# Patient Record
Sex: Female | Born: 1945
Health system: Southern US, Community
[De-identification: ages and names within clinical notes are randomized; demographics above are authoritative.]

## PROBLEM LIST (undated history)

## (undated) DIAGNOSIS — G4700 Insomnia, unspecified: Secondary | ICD-10-CM

## (undated) DIAGNOSIS — K589 Irritable bowel syndrome without diarrhea: Secondary | ICD-10-CM

## (undated) DIAGNOSIS — I341 Nonrheumatic mitral (valve) prolapse: Secondary | ICD-10-CM

## (undated) DIAGNOSIS — F419 Anxiety disorder, unspecified: Secondary | ICD-10-CM

## (undated) DIAGNOSIS — D649 Anemia, unspecified: Secondary | ICD-10-CM

## (undated) DIAGNOSIS — M069 Rheumatoid arthritis, unspecified: Secondary | ICD-10-CM

## (undated) DIAGNOSIS — Z860100 Personal history of colon polyps, unspecified: Secondary | ICD-10-CM

## (undated) DIAGNOSIS — G43109 Migraine with aura, not intractable, without status migrainosus: Secondary | ICD-10-CM

## (undated) DIAGNOSIS — I25119 Atherosclerotic heart disease of native coronary artery with unspecified angina pectoris: Secondary | ICD-10-CM

## (undated) DIAGNOSIS — R0989 Other specified symptoms and signs involving the circulatory and respiratory systems: Secondary | ICD-10-CM

## (undated) DIAGNOSIS — I2510 Atherosclerotic heart disease of native coronary artery without angina pectoris: Secondary | ICD-10-CM

## (undated) DIAGNOSIS — I739 Peripheral vascular disease, unspecified: Secondary | ICD-10-CM

## (undated) DIAGNOSIS — E785 Hyperlipidemia, unspecified: Secondary | ICD-10-CM

## (undated) DIAGNOSIS — E7800 Pure hypercholesterolemia, unspecified: Secondary | ICD-10-CM

## (undated) DIAGNOSIS — K219 Gastro-esophageal reflux disease without esophagitis: Secondary | ICD-10-CM

## (undated) DIAGNOSIS — Z8719 Personal history of other diseases of the digestive system: Secondary | ICD-10-CM

## (undated) DIAGNOSIS — I10 Essential (primary) hypertension: Secondary | ICD-10-CM

## (undated) DIAGNOSIS — F32A Depression, unspecified: Secondary | ICD-10-CM

## (undated) HISTORY — DX: Atherosclerotic heart disease of native coronary artery without angina pectoris: I25.10

## (undated) HISTORY — DX: Rheumatoid arthritis, unspecified: M06.9

## (undated) HISTORY — DX: Irritable bowel syndrome, unspecified: K58.9

## (undated) HISTORY — DX: Peripheral vascular disease, unspecified: I73.9

## (undated) HISTORY — DX: Atherosclerotic heart disease of native coronary artery with unspecified angina pectoris: I25.119

## (undated) HISTORY — DX: Migraine with aura, not intractable, without status migrainosus: G43.109

## (undated) HISTORY — DX: Depression, unspecified: F32.A

## (undated) HISTORY — DX: Anxiety disorder, unspecified: F41.9

## (undated) HISTORY — DX: Personal history of colonic polyps: Z86.010

## (undated) HISTORY — DX: Anemia, unspecified: D64.9

## (undated) HISTORY — DX: Personal history of other diseases of the digestive system: Z87.19

## (undated) HISTORY — DX: Hyperlipidemia, unspecified: E78.5

## (undated) HISTORY — DX: Essential (primary) hypertension: I10

## (undated) HISTORY — DX: Insomnia, unspecified: G47.00

## (undated) HISTORY — DX: Nonrheumatic mitral (valve) prolapse: I34.1

## (undated) HISTORY — PX: ABDOMINAL HYSTERECTOMY: SHX81

## (undated) HISTORY — DX: Pure hypercholesterolemia, unspecified: E78.00

## (undated) HISTORY — PX: CHOLECYSTECTOMY: SHX55

## (undated) HISTORY — DX: Personal history of colon polyps, unspecified: Z86.0100

## (undated) HISTORY — PX: OTHER SURGICAL HISTORY: SHX169

## (undated) HISTORY — DX: Gastro-esophageal reflux disease without esophagitis: K21.9

## (undated) HISTORY — DX: Other specified symptoms and signs involving the circulatory and respiratory systems: R09.89

## (undated) MED FILL — Iron Sucrose Inj 20 MG/ML (Fe Equiv): INTRAVENOUS | Qty: 10 | Status: AC

---

## 2005-04-17 ENCOUNTER — Ambulatory Visit: Payer: Self-pay | Admitting: Family Medicine

## 2005-05-17 ENCOUNTER — Ambulatory Visit: Payer: Self-pay | Admitting: Family Medicine

## 2005-09-14 ENCOUNTER — Ambulatory Visit: Payer: Self-pay | Admitting: Family Medicine

## 2005-10-25 ENCOUNTER — Ambulatory Visit: Payer: Self-pay | Admitting: Family Medicine

## 2006-01-01 ENCOUNTER — Ambulatory Visit: Payer: Self-pay | Admitting: Family Medicine

## 2009-10-18 DIAGNOSIS — I2510 Atherosclerotic heart disease of native coronary artery without angina pectoris: Secondary | ICD-10-CM

## 2009-10-18 HISTORY — DX: Atherosclerotic heart disease of native coronary artery without angina pectoris: I25.10

## 2012-09-25 HISTORY — PX: ESOPHAGOGASTRODUODENOSCOPY: SHX1529

## 2014-10-21 HISTORY — PX: COLONOSCOPY: SHX174

## 2016-01-26 DIAGNOSIS — M7989 Other specified soft tissue disorders: Secondary | ICD-10-CM | POA: Diagnosis not present

## 2016-01-26 DIAGNOSIS — G43809 Other migraine, not intractable, without status migrainosus: Secondary | ICD-10-CM | POA: Diagnosis not present

## 2016-01-27 DIAGNOSIS — I10 Essential (primary) hypertension: Secondary | ICD-10-CM

## 2016-01-27 HISTORY — DX: Essential (primary) hypertension: I10

## 2016-02-11 DIAGNOSIS — Z0000 Encounter for general adult medical examination without abnormal findings: Secondary | ICD-10-CM | POA: Diagnosis not present

## 2016-02-11 DIAGNOSIS — I10 Essential (primary) hypertension: Secondary | ICD-10-CM | POA: Diagnosis not present

## 2016-02-18 DIAGNOSIS — Z0000 Encounter for general adult medical examination without abnormal findings: Secondary | ICD-10-CM | POA: Diagnosis not present

## 2016-02-18 DIAGNOSIS — M069 Rheumatoid arthritis, unspecified: Secondary | ICD-10-CM | POA: Diagnosis not present

## 2016-02-18 DIAGNOSIS — I2510 Atherosclerotic heart disease of native coronary artery without angina pectoris: Secondary | ICD-10-CM | POA: Diagnosis not present

## 2016-02-18 DIAGNOSIS — G4700 Insomnia, unspecified: Secondary | ICD-10-CM | POA: Diagnosis not present

## 2016-02-18 DIAGNOSIS — I739 Peripheral vascular disease, unspecified: Secondary | ICD-10-CM | POA: Diagnosis not present

## 2016-02-18 DIAGNOSIS — G43109 Migraine with aura, not intractable, without status migrainosus: Secondary | ICD-10-CM | POA: Diagnosis not present

## 2016-02-18 DIAGNOSIS — I10 Essential (primary) hypertension: Secondary | ICD-10-CM | POA: Diagnosis not present

## 2016-02-26 DIAGNOSIS — I739 Peripheral vascular disease, unspecified: Secondary | ICD-10-CM

## 2016-02-26 DIAGNOSIS — M069 Rheumatoid arthritis, unspecified: Secondary | ICD-10-CM

## 2016-02-26 DIAGNOSIS — G4700 Insomnia, unspecified: Secondary | ICD-10-CM

## 2016-02-26 DIAGNOSIS — I2510 Atherosclerotic heart disease of native coronary artery without angina pectoris: Secondary | ICD-10-CM

## 2016-02-26 DIAGNOSIS — R0989 Other specified symptoms and signs involving the circulatory and respiratory systems: Secondary | ICD-10-CM | POA: Insufficient documentation

## 2016-02-26 DIAGNOSIS — G43109 Migraine with aura, not intractable, without status migrainosus: Secondary | ICD-10-CM

## 2016-02-26 DIAGNOSIS — I341 Nonrheumatic mitral (valve) prolapse: Secondary | ICD-10-CM | POA: Insufficient documentation

## 2016-02-26 HISTORY — DX: Nonrheumatic mitral (valve) prolapse: I34.1

## 2016-02-26 HISTORY — DX: Atherosclerotic heart disease of native coronary artery without angina pectoris: I25.10

## 2016-02-26 HISTORY — DX: Peripheral vascular disease, unspecified: I73.9

## 2016-02-26 HISTORY — DX: Other specified symptoms and signs involving the circulatory and respiratory systems: R09.89

## 2016-02-26 HISTORY — DX: Rheumatoid arthritis, unspecified: M06.9

## 2016-02-26 HISTORY — DX: Migraine with aura, not intractable, without status migrainosus: G43.109

## 2016-02-26 HISTORY — DX: Insomnia, unspecified: G47.00

## 2016-03-12 DIAGNOSIS — I25119 Atherosclerotic heart disease of native coronary artery with unspecified angina pectoris: Secondary | ICD-10-CM

## 2016-03-12 DIAGNOSIS — I10 Essential (primary) hypertension: Secondary | ICD-10-CM

## 2016-03-12 DIAGNOSIS — E785 Hyperlipidemia, unspecified: Secondary | ICD-10-CM

## 2016-03-12 HISTORY — DX: Atherosclerotic heart disease of native coronary artery with unspecified angina pectoris: I25.119

## 2016-03-12 HISTORY — DX: Hyperlipidemia, unspecified: E78.5

## 2016-03-12 HISTORY — DX: Essential (primary) hypertension: I10

## 2016-03-16 DIAGNOSIS — E785 Hyperlipidemia, unspecified: Secondary | ICD-10-CM | POA: Diagnosis not present

## 2016-03-16 DIAGNOSIS — I25119 Atherosclerotic heart disease of native coronary artery with unspecified angina pectoris: Secondary | ICD-10-CM | POA: Diagnosis not present

## 2016-03-24 DIAGNOSIS — Z1231 Encounter for screening mammogram for malignant neoplasm of breast: Secondary | ICD-10-CM | POA: Diagnosis not present

## 2016-06-13 DIAGNOSIS — R1030 Lower abdominal pain, unspecified: Secondary | ICD-10-CM | POA: Diagnosis not present

## 2016-06-13 DIAGNOSIS — J329 Chronic sinusitis, unspecified: Secondary | ICD-10-CM | POA: Diagnosis not present

## 2016-06-13 DIAGNOSIS — G4700 Insomnia, unspecified: Secondary | ICD-10-CM | POA: Diagnosis not present

## 2016-06-13 DIAGNOSIS — N952 Postmenopausal atrophic vaginitis: Secondary | ICD-10-CM | POA: Diagnosis not present

## 2016-06-13 DIAGNOSIS — I10 Essential (primary) hypertension: Secondary | ICD-10-CM | POA: Diagnosis not present

## 2016-07-10 DIAGNOSIS — I10 Essential (primary) hypertension: Secondary | ICD-10-CM | POA: Diagnosis not present

## 2016-07-10 DIAGNOSIS — G4700 Insomnia, unspecified: Secondary | ICD-10-CM | POA: Diagnosis not present

## 2016-07-10 DIAGNOSIS — R1030 Lower abdominal pain, unspecified: Secondary | ICD-10-CM | POA: Diagnosis not present

## 2016-08-23 DIAGNOSIS — H04123 Dry eye syndrome of bilateral lacrimal glands: Secondary | ICD-10-CM | POA: Diagnosis not present

## 2016-08-23 DIAGNOSIS — H2513 Age-related nuclear cataract, bilateral: Secondary | ICD-10-CM | POA: Diagnosis not present

## 2016-09-11 DIAGNOSIS — R1032 Left lower quadrant pain: Secondary | ICD-10-CM | POA: Diagnosis not present

## 2016-09-11 DIAGNOSIS — Z8719 Personal history of other diseases of the digestive system: Secondary | ICD-10-CM

## 2016-09-11 DIAGNOSIS — R110 Nausea: Secondary | ICD-10-CM | POA: Diagnosis not present

## 2016-09-11 HISTORY — DX: Personal history of other diseases of the digestive system: Z87.19

## 2016-09-13 DIAGNOSIS — Z8719 Personal history of other diseases of the digestive system: Secondary | ICD-10-CM | POA: Diagnosis not present

## 2016-09-13 DIAGNOSIS — R39198 Other difficulties with micturition: Secondary | ICD-10-CM | POA: Diagnosis not present

## 2016-09-13 DIAGNOSIS — Z9049 Acquired absence of other specified parts of digestive tract: Secondary | ICD-10-CM | POA: Diagnosis not present

## 2016-09-13 DIAGNOSIS — K5730 Diverticulosis of large intestine without perforation or abscess without bleeding: Secondary | ICD-10-CM | POA: Diagnosis not present

## 2016-09-13 DIAGNOSIS — R1032 Left lower quadrant pain: Secondary | ICD-10-CM | POA: Diagnosis not present

## 2016-09-15 DIAGNOSIS — R109 Unspecified abdominal pain: Secondary | ICD-10-CM | POA: Diagnosis not present

## 2016-09-15 DIAGNOSIS — Z8719 Personal history of other diseases of the digestive system: Secondary | ICD-10-CM | POA: Diagnosis not present

## 2016-09-15 DIAGNOSIS — R1032 Left lower quadrant pain: Secondary | ICD-10-CM | POA: Diagnosis not present

## 2016-09-15 DIAGNOSIS — R39198 Other difficulties with micturition: Secondary | ICD-10-CM | POA: Diagnosis not present

## 2016-10-18 DIAGNOSIS — Z23 Encounter for immunization: Secondary | ICD-10-CM | POA: Diagnosis not present

## 2017-01-15 DIAGNOSIS — Z79899 Other long term (current) drug therapy: Secondary | ICD-10-CM | POA: Diagnosis not present

## 2017-01-15 DIAGNOSIS — Z13220 Encounter for screening for lipoid disorders: Secondary | ICD-10-CM | POA: Diagnosis not present

## 2017-01-15 DIAGNOSIS — R7301 Impaired fasting glucose: Secondary | ICD-10-CM | POA: Diagnosis not present

## 2017-01-15 DIAGNOSIS — Z1329 Encounter for screening for other suspected endocrine disorder: Secondary | ICD-10-CM | POA: Diagnosis not present

## 2017-01-17 DIAGNOSIS — I341 Nonrheumatic mitral (valve) prolapse: Secondary | ICD-10-CM | POA: Diagnosis not present

## 2017-01-17 DIAGNOSIS — G43109 Migraine with aura, not intractable, without status migrainosus: Secondary | ICD-10-CM | POA: Diagnosis not present

## 2017-01-17 DIAGNOSIS — Z8719 Personal history of other diseases of the digestive system: Secondary | ICD-10-CM | POA: Diagnosis not present

## 2017-01-17 DIAGNOSIS — I739 Peripheral vascular disease, unspecified: Secondary | ICD-10-CM | POA: Diagnosis not present

## 2017-01-17 DIAGNOSIS — Z23 Encounter for immunization: Secondary | ICD-10-CM | POA: Diagnosis not present

## 2017-01-17 DIAGNOSIS — E7800 Pure hypercholesterolemia, unspecified: Secondary | ICD-10-CM

## 2017-01-17 DIAGNOSIS — I10 Essential (primary) hypertension: Secondary | ICD-10-CM | POA: Diagnosis not present

## 2017-01-17 DIAGNOSIS — I2510 Atherosclerotic heart disease of native coronary artery without angina pectoris: Secondary | ICD-10-CM | POA: Diagnosis not present

## 2017-01-17 DIAGNOSIS — G4700 Insomnia, unspecified: Secondary | ICD-10-CM | POA: Diagnosis not present

## 2017-01-17 DIAGNOSIS — M069 Rheumatoid arthritis, unspecified: Secondary | ICD-10-CM | POA: Diagnosis not present

## 2017-01-17 HISTORY — DX: Pure hypercholesterolemia, unspecified: E78.00

## 2017-03-21 DIAGNOSIS — I25119 Atherosclerotic heart disease of native coronary artery with unspecified angina pectoris: Secondary | ICD-10-CM | POA: Diagnosis not present

## 2017-03-21 DIAGNOSIS — I10 Essential (primary) hypertension: Secondary | ICD-10-CM | POA: Diagnosis not present

## 2017-03-21 DIAGNOSIS — E785 Hyperlipidemia, unspecified: Secondary | ICD-10-CM | POA: Diagnosis not present

## 2017-05-11 DIAGNOSIS — Z1231 Encounter for screening mammogram for malignant neoplasm of breast: Secondary | ICD-10-CM | POA: Diagnosis not present

## 2017-07-31 DIAGNOSIS — G43109 Migraine with aura, not intractable, without status migrainosus: Secondary | ICD-10-CM | POA: Diagnosis not present

## 2017-07-31 DIAGNOSIS — D649 Anemia, unspecified: Secondary | ICD-10-CM

## 2017-07-31 DIAGNOSIS — G4700 Insomnia, unspecified: Secondary | ICD-10-CM | POA: Diagnosis not present

## 2017-07-31 HISTORY — DX: Anemia, unspecified: D64.9

## 2017-10-09 DIAGNOSIS — Z23 Encounter for immunization: Secondary | ICD-10-CM | POA: Diagnosis not present

## 2017-10-09 DIAGNOSIS — H2513 Age-related nuclear cataract, bilateral: Secondary | ICD-10-CM | POA: Diagnosis not present

## 2017-10-15 ENCOUNTER — Encounter: Payer: Self-pay | Admitting: *Deleted

## 2017-10-16 ENCOUNTER — Other Ambulatory Visit: Payer: Self-pay | Admitting: *Deleted

## 2017-10-16 ENCOUNTER — Encounter: Payer: Self-pay | Admitting: *Deleted

## 2017-10-19 NOTE — Progress Notes (Signed)
Cardiology Office Note:    Date:  10/22/2017   ID:  Angela Mccoy, DOB Jul 27, 1946, MRN [id number]  PCP:  [name], DO  Cardiologist:  [name], MD    Referring MD: She will be establishing care with Dr. Helene Kelp   ASSESSMENT:    1. Coronary artery disease involving native coronary artery of native heart with angina pectoris ([place])   2. Essential hypertension   3. High cholesterol   4. Hyperlipidemia, unspecified hyperlipidemia type    PLAN:    In order of problems listed above:  1. Stable continue medical treatment including clopidogrel and statin.  At this time she wishes to defer an ischemic evaluation.  In the absence of symptoms is a reasonable choice. 2. Stable blood pressure target continue ARB diuretic 3. Stable continue her low intensity statin   Next appointment: 6 months   Medication Adjustments/Labs and Tests Ordered: Current medicines are reviewed at length with the patient today.  Concerns regarding medicines are outlined above.  Orders Placed This Encounter  Procedures  . Comprehensive Metabolic Panel (CMET)  . Lipid Panel w/o Chol/HDL Ratio  . EKG 12-Lead   No orders of the defined types were placed in this encounter.   Chief Complaint  Patient presents with  . Follow-up  . Coronary Artery Disease  . Hypertension  . Hyperlipidemia    History of Present Illness:    Angela Mccoy is a 71 y.o. female with a hx of CAD withPCI and DES to LAD and RCA in 2003 , Dyslipidemia, HTN, Swelling/Edema  last seen 6 months ago.. Compliance with diet, lifestyle and medications: yes Past Medical History:  Diagnosis Date  . Anemia 07/31/2017  . Carotid bruit 02/26/2016  . Carotid bruit present 02/26/2016  . Coronary artery disease involving native coronary artery 02/26/2016  . Coronary artery disease involving native coronary artery of native heart with angina pectoris ([place]) 03/12/2016   Overview:  PCI and DES to LAD and RCA in 2003, cath 2010 wo restenosis  .  Essential hypertension 03/12/2016  . High cholesterol 01/17/2017  . Hx of diverticulitis of colon 09/11/2016  . Hyperlipidemia 03/12/2016  . Hypertension 01/27/2016  . Insomnia 02/26/2016  . Migraine with aura 02/26/2016  . Mitral valve prolapse 02/26/2016  . Peripheral vascular disease ([place]) 02/26/2016  . Rheumatoid arthritis involving multiple joints ([place]) 02/26/2016    Past Surgical History:  Procedure Laterality Date  . ABDOMINAL HYSTERECTOMY    . CHOLECYSTECTOMY      Current Medications: Current Meds  Medication Sig  . citalopram (CELEXA) 40 MG tablet TAKE 1 TABLET BY MOUTH EVERY DAY  . clopidogrel (PLAVIX) 75 MG tablet TAKE 1 TABLET BY MOUTH EVERY DAY  . losartan-hydrochlorothiazide (HYZAAR) 100-25 MG tablet TAKE 1 TABLET BY MOUTH EVERY DAY FOR BLOOD PRESSURE  . nitroGLYCERIN (NITROSTAT) 0.4 MG SL tablet ONE TABLET UNDER TONGUE AS NEEDED FOR CHEST PAIN EVERY 5 MINUTES  . pravastatin (PRAVACHOL) 40 MG tablet TAKE 1 TABLET BY MOUTH DAILY  . propranolol (INDERAL) 10 MG tablet TAKE 1 TABLET BY MOUTH ONCE A DAY  . QUEtiapine (SEROQUEL) 200 MG tablet Take 200 mg by mouth.  . traMADol (ULTRAM) 50 MG tablet TAKE 1 TABLET BY MOUTH EVERY 6 HOURS AS NEEDED  . triamcinolone cream (KENALOG) 0.1 % APPLY EXTERNALLY TO THE AFFECTED AREA TWICE DAILY AS NEEDED     Allergies:   Penicillins   Social History   Social History  . Marital status: Married    Spouse name:  N/A  . Number of children: N/A  . Years of education: N/A   Social History Main Topics  . Smoking status: Former Research scientist (life sciences)  . Smokeless tobacco: Never Used  . Alcohol use No  . Drug use: Unknown  . Sexual activity: Not Asked   Other Topics Concern  . None   Social History Narrative  . None     Family History: The patient's family history includes Heart attack in her brother, mother, and sister. ROS:   Please see the history of present illness.    All other systems reviewed and are negative.  EKGs/Labs/Other Studies Reviewed:     The following studies were reviewed today:  EKG:  EKG ordered today.  The ekg ordered today demonstrates [place] first degree AVB  Recent Labs: CMP normal 01/15/17 No results found for requested labs within last 8760 hours.  Recent Lipid Panel 01/15/17 Chol 126, HDL 43, LDL 83 No results found for: CHOL, TRIG, HDL, CHOLHDL, VLDL, LDLCALC, LDLDIRECT  Physical Exam:    VS:  BP 130/70 (BP Location: Right Arm, Patient Position: Sitting, Cuff Size: Normal)   Pulse (!) 57   Ht 5' 2.5" (1.588 m)   Wt 146 lb (66.2 kg)   SpO2 97%   BMI 26.28 kg/m     Wt Readings from Last 3 Encounters:  10/22/17 146 lb (66.2 kg)     GEN:  Well nourished, well developed in no acute distress HEENT: Normal NECK: No JVD; No carotid bruits LYMPHATICS: No lymphadenopathy CARDIAC: RRR, no murmurs, rubs, gallops RESPIRATORY:  Clear to auscultation without rales, wheezing or rhonchi  ABDOMEN: Soft, non-tender, non-distended MUSCULOSKELETAL:  No edema; No deformity  SKIN: Warm and dry NEUROLOGIC:  Alert and oriented x 3 PSYCHIATRIC:  Normal affect    Signed, [name], MD  10/22/2017 8:31 AM    [place]

## 2017-10-22 ENCOUNTER — Encounter: Payer: Self-pay | Admitting: Cardiology

## 2017-10-22 ENCOUNTER — Ambulatory Visit (INDEPENDENT_AMBULATORY_CARE_PROVIDER_SITE_OTHER): Payer: Medicare Other | Admitting: Cardiology

## 2017-10-22 VITALS — BP 130/70 | HR 57 | Ht 62.5 in | Wt 146.0 lb

## 2017-10-22 DIAGNOSIS — I10 Essential (primary) hypertension: Secondary | ICD-10-CM

## 2017-10-22 DIAGNOSIS — E7800 Pure hypercholesterolemia, unspecified: Secondary | ICD-10-CM

## 2017-10-22 DIAGNOSIS — I25119 Atherosclerotic heart disease of native coronary artery with unspecified angina pectoris: Secondary | ICD-10-CM

## 2017-10-22 NOTE — Patient Instructions (Addendum)
Medication Instructions:  Your physician recommends that you continue on your current medications as directed. Please refer to the Current Medication list given to you today.  Labwork: Your physician recommends that you return for lab work in: today. CMP, lipid  Testing/Procedures: You had an EKG today.  Follow-Up: Your physician wants you to follow-up in: 9 months. You will receive a reminder letter in the mail two months in advance. If you don't receive a letter, please call our office to schedule the follow-up appointment.  Any Other Special Instructions Will Be Listed Below (If Applicable).     If you need a refill on your cardiac medications before your next appointment, please call your pharmacy.

## 2017-10-23 LAB — LIPID PANEL W/O CHOL/HDL RATIO
CHOLESTEROL TOTAL: 135 mg/dL (ref 100–199)
HDL: 49 mg/dL (ref >39)
LDL CALC: 55 mg/dL (ref 0–99)
Triglycerides: 155 mg/dL — ABNORMAL HIGH (ref 0–149)
VLDL CHOLESTEROL CAL: 31 mg/dL (ref 5–40)

## 2017-10-23 LAB — COMPREHENSIVE METABOLIC PANEL
ALBUMIN: 4.4 g/dL (ref 3.5–4.8)
ALK PHOS: 87 IU/L (ref 39–117)
ALT: 21 IU/L (ref 0–32)
AST: 24 IU/L (ref 0–40)
Albumin/Globulin Ratio: 1.4 (ref 1.2–2.2)
BUN / CREAT RATIO: 16 (ref 12–28)
BUN: 16 mg/dL (ref 8–27)
Bilirubin Total: 0.2 mg/dL (ref 0.0–1.2)
CALCIUM: 10.3 mg/dL (ref 8.7–10.3)
CO2: 29 mmol/L (ref 20–29)
CREATININE: 1.02 mg/dL — AB (ref 0.57–1.00)
Chloride: 93 mmol/L — ABNORMAL LOW (ref 96–106)
GFR calc Af Amer: 64 mL/min/{1.73_m2} (ref >59)
GFR, EST NON AFRICAN AMERICAN: 56 mL/min/{1.73_m2} — AB (ref >59)
GLUCOSE: 92 mg/dL (ref 65–99)
Globulin, Total: 3.1 g/dL (ref 1.5–4.5)
Potassium: 4.8 mmol/L (ref 3.5–5.2)
Sodium: 133 mmol/L — ABNORMAL LOW (ref 134–144)
TOTAL PROTEIN: 7.5 g/dL (ref 6.0–8.5)

## 2017-11-12 DIAGNOSIS — S61211A Laceration without foreign body of left index finger without damage to nail, initial encounter: Secondary | ICD-10-CM | POA: Diagnosis not present

## 2017-11-12 DIAGNOSIS — S63261A Dislocation of metacarpophalangeal joint of left index finger, initial encounter: Secondary | ICD-10-CM | POA: Diagnosis not present

## 2017-11-12 DIAGNOSIS — S63251A Unspecified dislocation of left index finger, initial encounter: Secondary | ICD-10-CM | POA: Diagnosis not present

## 2017-11-12 DIAGNOSIS — Z23 Encounter for immunization: Secondary | ICD-10-CM | POA: Diagnosis not present

## 2017-11-14 DIAGNOSIS — S63251A Unspecified dislocation of left index finger, initial encounter: Secondary | ICD-10-CM | POA: Diagnosis not present

## 2017-11-28 DIAGNOSIS — S63251A Unspecified dislocation of left index finger, initial encounter: Secondary | ICD-10-CM | POA: Diagnosis not present

## 2017-12-13 DIAGNOSIS — G43909 Migraine, unspecified, not intractable, without status migrainosus: Secondary | ICD-10-CM | POA: Diagnosis not present

## 2017-12-13 DIAGNOSIS — I2510 Atherosclerotic heart disease of native coronary artery without angina pectoris: Secondary | ICD-10-CM | POA: Diagnosis not present

## 2017-12-13 DIAGNOSIS — Z6826 Body mass index (BMI) 26.0-26.9, adult: Secondary | ICD-10-CM | POA: Diagnosis not present

## 2018-01-15 DIAGNOSIS — F419 Anxiety disorder, unspecified: Secondary | ICD-10-CM | POA: Diagnosis not present

## 2018-01-15 DIAGNOSIS — M8580 Other specified disorders of bone density and structure, unspecified site: Secondary | ICD-10-CM | POA: Diagnosis not present

## 2018-01-15 DIAGNOSIS — M859 Disorder of bone density and structure, unspecified: Secondary | ICD-10-CM | POA: Diagnosis not present

## 2018-01-15 DIAGNOSIS — E785 Hyperlipidemia, unspecified: Secondary | ICD-10-CM | POA: Diagnosis not present

## 2018-01-15 DIAGNOSIS — Z0000 Encounter for general adult medical examination without abnormal findings: Secondary | ICD-10-CM | POA: Diagnosis not present

## 2018-01-15 DIAGNOSIS — Z13820 Encounter for screening for osteoporosis: Secondary | ICD-10-CM | POA: Diagnosis not present

## 2018-01-15 DIAGNOSIS — F329 Major depressive disorder, single episode, unspecified: Secondary | ICD-10-CM | POA: Diagnosis not present

## 2018-02-20 DIAGNOSIS — J208 Acute bronchitis due to other specified organisms: Secondary | ICD-10-CM | POA: Diagnosis not present

## 2018-02-20 DIAGNOSIS — B9689 Other specified bacterial agents as the cause of diseases classified elsewhere: Secondary | ICD-10-CM | POA: Diagnosis not present

## 2018-02-20 DIAGNOSIS — J90 Pleural effusion, not elsewhere classified: Secondary | ICD-10-CM | POA: Diagnosis not present

## 2018-02-20 DIAGNOSIS — Z6825 Body mass index (BMI) 25.0-25.9, adult: Secondary | ICD-10-CM | POA: Diagnosis not present

## 2018-03-07 DIAGNOSIS — Z6825 Body mass index (BMI) 25.0-25.9, adult: Secondary | ICD-10-CM | POA: Diagnosis not present

## 2018-03-07 DIAGNOSIS — B0229 Other postherpetic nervous system involvement: Secondary | ICD-10-CM | POA: Diagnosis not present

## 2018-06-21 DIAGNOSIS — Z6825 Body mass index (BMI) 25.0-25.9, adult: Secondary | ICD-10-CM | POA: Diagnosis not present

## 2018-06-21 DIAGNOSIS — F4321 Adjustment disorder with depressed mood: Secondary | ICD-10-CM | POA: Diagnosis not present

## 2018-07-27 DIAGNOSIS — Z1231 Encounter for screening mammogram for malignant neoplasm of breast: Secondary | ICD-10-CM | POA: Diagnosis not present

## 2018-08-27 ENCOUNTER — Ambulatory Visit: Payer: Medicare Other | Admitting: Cardiology

## 2018-09-02 DIAGNOSIS — R509 Fever, unspecified: Secondary | ICD-10-CM | POA: Diagnosis not present

## 2018-09-02 DIAGNOSIS — R1084 Generalized abdominal pain: Secondary | ICD-10-CM | POA: Diagnosis not present

## 2018-09-02 DIAGNOSIS — R112 Nausea with vomiting, unspecified: Secondary | ICD-10-CM | POA: Diagnosis not present

## 2018-09-20 ENCOUNTER — Encounter: Payer: Self-pay | Admitting: Cardiology

## 2018-09-20 ENCOUNTER — Ambulatory Visit (INDEPENDENT_AMBULATORY_CARE_PROVIDER_SITE_OTHER): Payer: Medicare Other | Admitting: Cardiology

## 2018-09-20 VITALS — BP 156/80 | HR 64 | Ht 62.5 in | Wt 143.2 lb

## 2018-09-20 DIAGNOSIS — E785 Hyperlipidemia, unspecified: Secondary | ICD-10-CM

## 2018-09-20 DIAGNOSIS — I25119 Atherosclerotic heart disease of native coronary artery with unspecified angina pectoris: Secondary | ICD-10-CM

## 2018-09-20 DIAGNOSIS — I10 Essential (primary) hypertension: Secondary | ICD-10-CM | POA: Diagnosis not present

## 2018-09-20 MED ORDER — CANDESARTAN CILEXETIL-HCTZ 32-12.5 MG PO TABS: 1.0000 | ORAL_TABLET | Freq: Every day | ORAL | 6 refills | Status: DC

## 2018-09-20 NOTE — Progress Notes (Signed)
Cardiology Office Note:    Date:  09/20/2018   ID:  Angela Mccoy, DOB 05/20/1946, MRN [id number]  PCP:  Ronita Hipps, MD  Cardiologist:  [name], MD    Referring MD: Ronita Hipps, MD    ASSESSMENT:    1. Coronary artery disease involving native coronary artery of native heart with angina pectoris ([place])   2. Essential hypertension   3. Hyperlipidemia, unspecified hyperlipidemia type    PLAN:    In order of problems listed above:  1. Her CAD is chronic stable presently having no angina we reviewed the option of doing ischemia evaluation she feels comfortable with monitoring symptoms and medical treatment at this time. 2. Not well controlled repeat 150/70 I will switch her to a more potent ARB diuretic combination recheck office BP in 1 week goal less than 938 systolic 3. Her lipids are ideal continue her current lipid-lowering therapy   Next appointment: One year   Medication Adjustments/Labs and Tests Ordered: Current medicines are reviewed at length with the patient today.  Concerns regarding medicines are outlined above.  Orders Placed This Encounter  Procedures  . EKG 12-Lead   Meds ordered this encounter  Medications  . candesartan-hydrochlorothiazide (ATACAND HCT) 32-12.5 MG tablet    Sig: Take 1 tablet by mouth daily.    Dispense:  30 tablet    Refill:  6    Chief Complaint  Patient presents with  . Coronary Artery Disease  . Hypertension  . Hyperlipidemia    History of Present Illness:    Angela Mccoy is a 72 y.o. female with a hx of CAD with PCI and drug-eluting stent to the LAD and right coronary artery in 2003, dyslipidemia hypertension and chronic lower extremity edema last seen 10/22/2017. Compliance with diet, lifestyle and medications: Yes  She is at a very difficult year her daughter died during the winter of brain tumor and a sister also died of intracranial hemorrhage.  During grieving she had a great deal of chest pain and took  nitroglycerin frequently.  Was seen with her PCP placed and anxiety lytic medication and the episodes stopped.  She has had none since she goes to the gym and exercises nearly daily sodium restriction was unaware blood pressure was above target.  No shortness of breath edema syncope or TIA Past Medical History:  Diagnosis Date  . Anemia 07/31/2017  . Carotid bruit 02/26/2016  . Carotid bruit present 02/26/2016  . Coronary artery disease involving native coronary artery 02/26/2016  . Coronary artery disease involving native coronary artery of native heart with angina pectoris ([place]) 03/12/2016   Overview:  PCI and DES to LAD and RCA in 2003, cath 2010 wo restenosis  . Essential hypertension 03/12/2016  . High cholesterol 01/17/2017  . Hx of diverticulitis of colon 09/11/2016  . Hyperlipidemia 03/12/2016  . Hypertension 01/27/2016  . Insomnia 02/26/2016  . Migraine with aura 02/26/2016  . Mitral valve prolapse 02/26/2016  . Peripheral vascular disease ([place]) 02/26/2016  . Rheumatoid arthritis involving multiple joints ([place]) 02/26/2016    Past Surgical History:  Procedure Laterality Date  . ABDOMINAL HYSTERECTOMY    . CHOLECYSTECTOMY      Current Medications: Current Meds  Medication Sig  . citalopram (CELEXA) 40 MG tablet TAKE 1 TABLET BY MOUTH EVERY DAY  . clopidogrel (PLAVIX) 75 MG tablet TAKE 1 TABLET BY MOUTH EVERY DAY  . LORazepam (ATIVAN) 0.5 MG tablet Take 0.5 mg by mouth at bedtime.  . nitroGLYCERIN (  NITROSTAT) 0.4 MG SL tablet ONE TABLET UNDER TONGUE AS NEEDED FOR CHEST PAIN EVERY 5 MINUTES  . pravastatin (PRAVACHOL) 40 MG tablet TAKE 1 TABLET BY MOUTH DAILY  . propranolol (INDERAL) 10 MG tablet TAKE 1 TABLET BY MOUTH ONCE A DAY  . QUEtiapine (SEROQUEL) 200 MG tablet Take 200 mg by mouth daily.   . traMADol (ULTRAM) 50 MG tablet TAKE 1 TABLET BY MOUTH EVERY 6 HOURS AS NEEDED  . [DISCONTINUED] losartan-hydrochlorothiazide (HYZAAR) 100-25 MG tablet TAKE 1 TABLET BY MOUTH EVERY DAY FOR BLOOD PRESSURE       Allergies:   Penicillins   Social History   Socioeconomic History  . Marital status: Married    Spouse name: Not on file  . Number of children: Not on file  . Years of education: Not on file  . Highest education level: Not on file  Occupational History  . Not on file  Social Needs  . Financial resource strain: Not on file  . Food insecurity:    Worry: Not on file    Inability: Not on file  . Transportation needs:    Medical: Not on file    Non-medical: Not on file  Tobacco Use  . Smoking status: Former Research scientist (life sciences)  . Smokeless tobacco: Never Used  Substance and Sexual Activity  . Alcohol use: No  . Drug use: Never  . Sexual activity: Not on file  Lifestyle  . Physical activity:    Days per week: Not on file    Minutes per session: Not on file  . Stress: Not on file  Relationships  . Social connections:    Talks on phone: Not on file    Gets together: Not on file    Attends religious service: Not on file    Active member of club or organization: Not on file    Attends meetings of clubs or organizations: Not on file    Relationship status: Not on file  Other Topics Concern  . Not on file  Social History Narrative  . Not on file     Family History: The patient's family history includes Heart attack in her brother, mother, and sister. ROS:   Please see the history of present illness.    All other systems reviewed and are negative.  EKGs/Labs/Other Studies Reviewed:    The following studies were reviewed today:  EKG:  EKG ordered today.  The ekg ordered today demonstrates sinus rhythm and is normal  Recent Labs: Cholesterol 135 HDL 49 LDL 55 10/22/2017: ALT 21; BUN 16; Creatinine, Ser 1.02; Potassium 4.8; Sodium 133  Recent Lipid Panel    Component Value Date/Time   CHOL 135 10/22/2017 0843   TRIG 155 (H) 10/22/2017 0843   HDL 49 10/22/2017 0843   LDLCALC 55 10/22/2017 0843    Physical Exam:    VS:  BP (!) 156/80 (BP Location: Right Arm, Patient  Position: Sitting, Cuff Size: Normal)   Pulse 64   Ht 5' 2.5" (1.588 m)   Wt 143 lb 3.2 oz (65 kg)   SpO2 98%   BMI 25.77 kg/m     Wt Readings from Last 3 Encounters:  09/20/18 143 lb 3.2 oz (65 kg)  10/22/17 146 lb (66.2 kg)     GEN:  Well nourished, well developed in no acute distress HEENT: Normal NECK: No JVD; No carotid bruits LYMPHATICS: No lymphadenopathy CARDIAC: RRR, no murmurs, rubs, gallops RESPIRATORY:  Clear to auscultation without rales, wheezing or rhonchi  ABDOMEN: Soft, non-tender,  non-distended MUSCULOSKELETAL:  No edema; No deformity  SKIN: Warm and dry NEUROLOGIC:  Alert and oriented x 3 PSYCHIATRIC:  Normal affect    Signed, [name], MD  09/20/2018 11:28 AM    [facility]

## 2018-09-20 NOTE — Patient Instructions (Signed)
Medication Instructions:  Your physician has recommended you make the following change in your medication:   STOP: Losartan  START: Candesartan HCTZ 32/12.5mg  one tablet daily   Labwork: NONE  Testing/Procedures: You will have blood pressure check done in 2 weeks in the office.   Follow-Up: Your physician wants you to follow-up in: 1 year.  You will receive a reminder letter in the mail two months in advance. If you don't receive a letter, please call our office to schedule the follow-up appointment.   Any Other Special Instructions Will Be Listed Below (If Applicable).     If you need a refill on your cardiac medications before your next appointment, please call your pharmacy.

## 2018-10-04 ENCOUNTER — Ambulatory Visit (INDEPENDENT_AMBULATORY_CARE_PROVIDER_SITE_OTHER): Payer: Medicare Other | Admitting: Cardiology

## 2018-10-04 VITALS — BP 156/60 | HR 75

## 2018-10-04 DIAGNOSIS — I2510 Atherosclerotic heart disease of native coronary artery without angina pectoris: Secondary | ICD-10-CM

## 2018-10-04 DIAGNOSIS — R0789 Other chest pain: Secondary | ICD-10-CM

## 2018-10-04 MED ORDER — ISOSORBIDE MONONITRATE ER 30 MG PO TB24: 30.0000 mg | ORAL_TABLET | Freq: Every day | ORAL | 2 refills | Status: DC

## 2018-10-04 NOTE — Progress Notes (Signed)
Patient presented today for blood pressure check due to recent ARB med change. The patient states that she has been having intermittent chest discomfort that radiates to her shoulder blades and down her left arm with activity. The patient states that her daughter has recently passed and she has been under a great deal of stress. EKG reflects NSR. Per Dr. Geraldo Pitter based upon patient's current symptoms and history patient is to have stress test asap and take 30 mg imdur daily. Patient will follow with Dr. Bettina Gavia within 4 weeks.

## 2018-10-04 NOTE — Patient Instructions (Signed)
Medication Instructions:  Your physician has recommended you make the following change in your medication:   START imdur 30 mg daily  If you need a refill on your cardiac medications before your next appointment, please call your pharmacy.   Lab work: None  If you have labs (blood work) drawn today and your tests are completely normal, you will receive your results only by: Marland Kitchen MyChart Message (if you have MyChart) OR . A paper copy in the mail If you have any lab test that is abnormal or we need to change your treatment, we will call you to review the results.  Testing/Procedures: Your physician has requested that you have a lexiscan myoview. For further information please visit [email or web address]. Please follow instruction sheet, as given.  [facility] [address], [place], [place] [address] [phone]  Lexiscan testing instructions:  Please present to [facility] 15 minutes earlier than your appointment time to allow for registration.  You will be called with an appointment date and time by our office once it has been scheduled; please allow at least 48 hours for [place] to contact you.  No food or drink after midnight prior to your test (except for small sips of water with your medications).  Bring a medication list or all your medications with you the morning of the testing.  No caffeine, decaffeinated or chocolate products 12 hours prior to the testing.  Please be aware that the test can take up to 3-4 hours.  Should you have any problem with the appointment date or time, please call [phone].   Please call the office with any further questions or concerns.    Follow-Up: At [facility], you and your health needs are our priority.  As part of our continuing mission to provide you with exceptional heart care, we have created designated Provider Care Teams.  These Care Teams include your primary Cardiologist (physician) and Advanced  Practice Providers (APPs -  Physician Assistants and Nurse Practitioners) who all work together to provide you with the care you need, when you need it.  You will need a follow up appointment in 4 weeks.  Please call our office 2 months in advance to schedule this appointment.  You may see another member of our [employer] Provider Team in [place]: Jenne Campus, MD . Jyl Heinz, MD  Any Other Special Instructions Will Be Listed Below (If Applicable).

## 2018-10-09 DIAGNOSIS — R0789 Other chest pain: Secondary | ICD-10-CM | POA: Diagnosis not present

## 2018-10-09 DIAGNOSIS — R079 Chest pain, unspecified: Secondary | ICD-10-CM

## 2018-10-21 DIAGNOSIS — Z23 Encounter for immunization: Secondary | ICD-10-CM | POA: Diagnosis not present

## 2018-11-15 NOTE — Progress Notes (Signed)
Cardiology Office Note:    Date:  11/18/2018   ID:  Angela Mccoy, DOB Jan 23, 1946, MRN [id number]  PCP:  Ronita Hipps, MD  Cardiologist:  [name], MD    Referring MD: Ronita Hipps, MD    ASSESSMENT:    1. Coronary artery disease involving native coronary artery of native heart with angina pectoris ([place])   2. Essential hypertension   3. Hyperlipidemia, unspecified hyperlipidemia type    PLAN:    In order of problems listed above:  1. Improved presently asymptomatic and will continue current medical treatment including clopidogrel oral nitrate beta-blocker and her statin. 2. Stable at target continue current treatment 3. Continue her statin check liver function lipid profile   Next appointment: 6 months   Medication Adjustments/Labs and Tests Ordered: Current medicines are reviewed at length with the patient today.  Concerns regarding medicines are outlined above.  No orders of the defined types were placed in this encounter.  No orders of the defined types were placed in this encounter.   Chief Complaint  Patient presents with  . Follow-up  . Coronary Artery Disease    History of Present Illness:      Angela Mccoy is a 72 y.o. female with a hx of CAD with PCI and drug-eluting stent to the LAD and right coronary artery in 2003, dyslipidemia hypertension and chronic lower extremity edema  last seen 09/20/18. MPI [facility] 10/08/18 was normal. Compliance with diet, lifestyle and medications: yes  She is under a great deal of stress in her personal life but fortunately is had no further angina shortness of breath palpitation or syncope and she is quite reassured by her normal myocardial perfusion study for now we will continue medical treatment her last labs were a year ago we will check her liver function and lipid profile today Past Medical History:  Diagnosis Date  . Anemia 07/31/2017  . Carotid bruit 02/26/2016  . Carotid bruit present 02/26/2016  . Coronary  artery disease involving native coronary artery 02/26/2016  . Coronary artery disease involving native coronary artery of native heart with angina pectoris ([place]) 03/12/2016   Overview:  PCI and DES to LAD and RCA in 2003, cath 2010 wo restenosis  . Essential hypertension 03/12/2016  . High cholesterol 01/17/2017  . Hx of diverticulitis of colon 09/11/2016  . Hyperlipidemia 03/12/2016  . Hypertension 01/27/2016  . Insomnia 02/26/2016  . Migraine with aura 02/26/2016  . Mitral valve prolapse 02/26/2016  . Peripheral vascular disease ([place]) 02/26/2016  . Rheumatoid arthritis involving multiple joints ([place]) 02/26/2016    Past Surgical History:  Procedure Laterality Date  . ABDOMINAL HYSTERECTOMY    . CHOLECYSTECTOMY      Current Medications: Current Meds  Medication Sig  . candesartan-hydrochlorothiazide (ATACAND HCT) 32-12.5 MG tablet Take 1 tablet by mouth daily.  . citalopram (CELEXA) 40 MG tablet TAKE 1 TABLET BY MOUTH EVERY DAY  . clopidogrel (PLAVIX) 75 MG tablet TAKE 1 TABLET BY MOUTH EVERY DAY  . isosorbide mononitrate (IMDUR) 30 MG 24 hr tablet Take 1 tablet (30 mg total) by mouth daily.  . nitroGLYCERIN (NITROSTAT) 0.4 MG SL tablet ONE TABLET UNDER TONGUE AS NEEDED FOR CHEST PAIN EVERY 5 MINUTES  . pravastatin (PRAVACHOL) 40 MG tablet TAKE 1 TABLET BY MOUTH DAILY  . propranolol (INDERAL) 10 MG tablet TAKE 1 TABLET BY MOUTH ONCE A DAY  . QUEtiapine (SEROQUEL) 200 MG tablet Take 200 mg by mouth daily.   . traMADol Veatrice Bourbon)  50 MG tablet TAKE 1 TABLET BY MOUTH EVERY 6 HOURS AS NEEDED     Allergies:   Penicillins   Social History   Socioeconomic History  . Marital status: Married    Spouse name: Not on file  . Number of children: Not on file  . Years of education: Not on file  . Highest education level: Not on file  Occupational History  . Not on file  Social Needs  . Financial resource strain: Not on file  . Food insecurity:    Worry: Not on file    Inability: Not on file  .  Transportation needs:    Medical: Not on file    Non-medical: Not on file  Tobacco Use  . Smoking status: Former Research scientist (life sciences)  . Smokeless tobacco: Never Used  Substance and Sexual Activity  . Alcohol use: No  . Drug use: Never  . Sexual activity: Not on file  Lifestyle  . Physical activity:    Days per week: Not on file    Minutes per session: Not on file  . Stress: Not on file  Relationships  . Social connections:    Talks on phone: Not on file    Gets together: Not on file    Attends religious service: Not on file    Active member of club or organization: Not on file    Attends meetings of clubs or organizations: Not on file    Relationship status: Not on file  Other Topics Concern  . Not on file  Social History Narrative  . Not on file     Family History: The patient's family history includes Heart attack in her brother, mother, and sister. ROS:   Please see the history of present illness.    All other systems reviewed and are negative.  EKGs/Labs/Other Studies Reviewed:    The following studies were reviewed today:   Recent Labs: No results found for requested labs within last 8760 hours.  Recent Lipid Panel    Component Value Date/Time   CHOL 135 10/22/2017 0843   TRIG 155 (H) 10/22/2017 0843   HDL 49 10/22/2017 0843   LDLCALC 55 10/22/2017 0843    Physical Exam:    VS:  BP (!) 114/58 (BP Location: Left Arm, Patient Position: Sitting, Cuff Size: Normal)   Pulse 71   Ht 5' 2.2" (1.58 m)   Wt 143 lb (64.9 kg)   SpO2 95%   BMI 25.99 kg/m     Wt Readings from Last 3 Encounters:  11/18/18 143 lb (64.9 kg)  09/20/18 143 lb 3.2 oz (65 kg)  10/22/17 146 lb (66.2 kg)     GEN:  Well nourished, well developed in no acute distress HEENT: Normal NECK: No JVD; No carotid bruits LYMPHATICS: No lymphadenopathy CARDIAC: RRR, no murmurs, rubs, gallops RESPIRATORY:  Clear to auscultation without rales, wheezing or rhonchi  ABDOMEN: Soft, non-tender,  non-distended MUSCULOSKELETAL:  No edema; No deformity  SKIN: Warm and dry NEUROLOGIC:  Alert and oriented x 3 PSYCHIATRIC:  Normal affect    Signed, [name], MD  11/18/2018 2:58 PM    Angela Mccoy

## 2018-11-18 ENCOUNTER — Encounter: Payer: Self-pay | Admitting: Cardiology

## 2018-11-18 ENCOUNTER — Ambulatory Visit (INDEPENDENT_AMBULATORY_CARE_PROVIDER_SITE_OTHER): Payer: Medicare Other | Admitting: Cardiology

## 2018-11-18 VITALS — BP 114/58 | HR 71 | Ht 62.2 in | Wt 143.0 lb

## 2018-11-18 DIAGNOSIS — I10 Essential (primary) hypertension: Secondary | ICD-10-CM | POA: Diagnosis not present

## 2018-11-18 DIAGNOSIS — I25119 Atherosclerotic heart disease of native coronary artery with unspecified angina pectoris: Secondary | ICD-10-CM

## 2018-11-18 DIAGNOSIS — E785 Hyperlipidemia, unspecified: Secondary | ICD-10-CM

## 2018-11-18 NOTE — Patient Instructions (Signed)
Medication Instructions:  Your physician recommends that you continue on your current medications as directed. Please refer to the Current Medication list given to you today.  If you need a refill on your cardiac medications before your next appointment, please call your pharmacy.   Lab work: Your physician recommends that you return for lab work today: CMP, lipid panel.   If you have labs (blood work) drawn today and your tests are completely normal, you will receive your results only by: . MyChart Message (if you have MyChart) OR . A paper copy in the mail If you have any lab test that is abnormal or we need to change your treatment, we will call you to review the results.  Testing/Procedures: None  Follow-Up: At [facility], you and your health needs are our priority.  As part of our continuing mission to provide you with exceptional heart care, we have created designated Provider Care Teams.  These Care Teams include your primary Cardiologist (physician) and Advanced Practice Providers (APPs -  Physician Assistants and Nurse Practitioners) who all work together to provide you with the care you need, when you need it. You will need a follow up appointment in 6 months.  Please call our office 2 months in advance to schedule this appointment.    

## 2018-11-19 LAB — COMPREHENSIVE METABOLIC PANEL
ALBUMIN: 4.5 g/dL (ref 3.5–4.8)
ALK PHOS: 70 IU/L (ref 39–117)
ALT: 11 IU/L (ref 0–32)
AST: 19 IU/L (ref 0–40)
Albumin/Globulin Ratio: 1.9 (ref 1.2–2.2)
BUN / CREAT RATIO: 19 (ref 12–28)
BUN: 21 mg/dL (ref 8–27)
Bilirubin Total: <0.2 mg/dL (ref 0.0–1.2)
CO2: 26 mmol/L (ref 20–29)
CREATININE: 1.11 mg/dL — AB (ref 0.57–1.00)
Calcium: 9.5 mg/dL (ref 8.7–10.3)
Chloride: 96 mmol/L (ref 96–106)
GFR calc Af Amer: 58 mL/min/{1.73_m2} — ABNORMAL LOW (ref >59)
GFR calc non Af Amer: 50 mL/min/{1.73_m2} — ABNORMAL LOW (ref >59)
GLOBULIN, TOTAL: 2.4 g/dL (ref 1.5–4.5)
Glucose: 158 mg/dL — ABNORMAL HIGH (ref 65–99)
POTASSIUM: 3.8 mmol/L (ref 3.5–5.2)
SODIUM: 136 mmol/L (ref 134–144)
Total Protein: 6.9 g/dL (ref 6.0–8.5)

## 2018-11-19 LAB — LIPID PANEL
Chol/HDL Ratio: 3.1 ratio (ref 0.0–4.4)
Cholesterol, Total: 126 mg/dL (ref 100–199)
HDL: 41 mg/dL (ref >39)
LDL Calculated: 48 mg/dL (ref 0–99)
Triglycerides: 187 mg/dL — ABNORMAL HIGH (ref 0–149)
VLDL CHOLESTEROL CAL: 37 mg/dL (ref 5–40)

## 2019-01-17 DIAGNOSIS — Z0000 Encounter for general adult medical examination without abnormal findings: Secondary | ICD-10-CM | POA: Diagnosis not present

## 2019-01-17 DIAGNOSIS — I10 Essential (primary) hypertension: Secondary | ICD-10-CM | POA: Diagnosis not present

## 2019-01-17 DIAGNOSIS — Z9181 History of falling: Secondary | ICD-10-CM | POA: Diagnosis not present

## 2019-01-17 DIAGNOSIS — Z79899 Other long term (current) drug therapy: Secondary | ICD-10-CM | POA: Diagnosis not present

## 2019-01-17 DIAGNOSIS — Z6824 Body mass index (BMI) 24.0-24.9, adult: Secondary | ICD-10-CM | POA: Diagnosis not present

## 2019-01-17 DIAGNOSIS — E785 Hyperlipidemia, unspecified: Secondary | ICD-10-CM | POA: Diagnosis not present

## 2019-02-12 DIAGNOSIS — H2513 Age-related nuclear cataract, bilateral: Secondary | ICD-10-CM | POA: Diagnosis not present

## 2019-02-16 DIAGNOSIS — Z79899 Other long term (current) drug therapy: Secondary | ICD-10-CM | POA: Diagnosis not present

## 2019-02-16 DIAGNOSIS — M069 Rheumatoid arthritis, unspecified: Secondary | ICD-10-CM | POA: Diagnosis not present

## 2019-02-16 DIAGNOSIS — I2510 Atherosclerotic heart disease of native coronary artery without angina pectoris: Secondary | ICD-10-CM | POA: Diagnosis not present

## 2019-02-16 DIAGNOSIS — I6523 Occlusion and stenosis of bilateral carotid arteries: Secondary | ICD-10-CM | POA: Diagnosis not present

## 2019-02-16 DIAGNOSIS — F419 Anxiety disorder, unspecified: Secondary | ICD-10-CM | POA: Diagnosis not present

## 2019-02-16 DIAGNOSIS — I361 Nonrheumatic tricuspid (valve) insufficiency: Secondary | ICD-10-CM | POA: Diagnosis not present

## 2019-02-16 DIAGNOSIS — Z951 Presence of aortocoronary bypass graft: Secondary | ICD-10-CM | POA: Diagnosis not present

## 2019-02-16 DIAGNOSIS — I10 Essential (primary) hypertension: Secondary | ICD-10-CM | POA: Diagnosis not present

## 2019-02-16 DIAGNOSIS — H53122 Transient visual loss, left eye: Secondary | ICD-10-CM | POA: Diagnosis not present

## 2019-02-16 DIAGNOSIS — G43909 Migraine, unspecified, not intractable, without status migrainosus: Secondary | ICD-10-CM

## 2019-02-16 DIAGNOSIS — I25810 Atherosclerosis of coronary artery bypass graft(s) without angina pectoris: Secondary | ICD-10-CM | POA: Diagnosis not present

## 2019-02-16 DIAGNOSIS — Z955 Presence of coronary angioplasty implant and graft: Secondary | ICD-10-CM | POA: Diagnosis not present

## 2019-02-16 DIAGNOSIS — D649 Anemia, unspecified: Secondary | ICD-10-CM | POA: Diagnosis not present

## 2019-02-16 DIAGNOSIS — H269 Unspecified cataract: Secondary | ICD-10-CM | POA: Diagnosis not present

## 2019-02-16 DIAGNOSIS — I639 Cerebral infarction, unspecified: Secondary | ICD-10-CM | POA: Diagnosis not present

## 2019-02-17 DIAGNOSIS — H53122 Transient visual loss, left eye: Secondary | ICD-10-CM | POA: Diagnosis not present

## 2019-02-17 DIAGNOSIS — Z951 Presence of aortocoronary bypass graft: Secondary | ICD-10-CM | POA: Diagnosis not present

## 2019-02-17 DIAGNOSIS — I2510 Atherosclerotic heart disease of native coronary artery without angina pectoris: Secondary | ICD-10-CM | POA: Diagnosis not present

## 2019-02-17 DIAGNOSIS — D649 Anemia, unspecified: Secondary | ICD-10-CM | POA: Diagnosis not present

## 2019-02-17 DIAGNOSIS — G43909 Migraine, unspecified, not intractable, without status migrainosus: Secondary | ICD-10-CM | POA: Diagnosis not present

## 2019-02-17 DIAGNOSIS — F419 Anxiety disorder, unspecified: Secondary | ICD-10-CM | POA: Diagnosis not present

## 2019-02-17 DIAGNOSIS — I10 Essential (primary) hypertension: Secondary | ICD-10-CM | POA: Diagnosis not present

## 2019-02-17 DIAGNOSIS — H539 Unspecified visual disturbance: Secondary | ICD-10-CM | POA: Diagnosis not present

## 2019-02-17 DIAGNOSIS — I639 Cerebral infarction, unspecified: Secondary | ICD-10-CM | POA: Diagnosis not present

## 2019-02-26 DIAGNOSIS — F329 Major depressive disorder, single episode, unspecified: Secondary | ICD-10-CM | POA: Diagnosis present

## 2019-02-26 DIAGNOSIS — E860 Dehydration: Secondary | ICD-10-CM | POA: Diagnosis present

## 2019-02-26 DIAGNOSIS — J189 Pneumonia, unspecified organism: Secondary | ICD-10-CM

## 2019-02-26 DIAGNOSIS — I25110 Atherosclerotic heart disease of native coronary artery with unstable angina pectoris: Secondary | ICD-10-CM | POA: Diagnosis not present

## 2019-02-26 DIAGNOSIS — B9562 Methicillin resistant Staphylococcus aureus infection as the cause of diseases classified elsewhere: Secondary | ICD-10-CM | POA: Diagnosis not present

## 2019-02-26 DIAGNOSIS — I25119 Atherosclerotic heart disease of native coronary artery with unspecified angina pectoris: Secondary | ICD-10-CM | POA: Diagnosis not present

## 2019-02-26 DIAGNOSIS — N183 Chronic kidney disease, stage 3 (moderate): Secondary | ICD-10-CM | POA: Diagnosis present

## 2019-02-26 DIAGNOSIS — D631 Anemia in chronic kidney disease: Secondary | ICD-10-CM | POA: Diagnosis not present

## 2019-02-26 DIAGNOSIS — Z452 Encounter for adjustment and management of vascular access device: Secondary | ICD-10-CM | POA: Diagnosis not present

## 2019-02-26 DIAGNOSIS — E871 Hypo-osmolality and hyponatremia: Secondary | ICD-10-CM | POA: Diagnosis present

## 2019-02-26 DIAGNOSIS — R7303 Prediabetes: Secondary | ICD-10-CM | POA: Diagnosis present

## 2019-02-26 DIAGNOSIS — Z880 Allergy status to penicillin: Secondary | ICD-10-CM | POA: Diagnosis not present

## 2019-02-26 DIAGNOSIS — D649 Anemia, unspecified: Secondary | ICD-10-CM | POA: Diagnosis present

## 2019-02-26 DIAGNOSIS — I2510 Atherosclerotic heart disease of native coronary artery without angina pectoris: Secondary | ICD-10-CM | POA: Diagnosis present

## 2019-02-26 DIAGNOSIS — K920 Hematemesis: Secondary | ICD-10-CM | POA: Diagnosis not present

## 2019-02-26 DIAGNOSIS — J850 Gangrene and necrosis of lung: Secondary | ICD-10-CM | POA: Diagnosis present

## 2019-02-26 DIAGNOSIS — J181 Lobar pneumonia, unspecified organism: Secondary | ICD-10-CM | POA: Diagnosis not present

## 2019-02-26 DIAGNOSIS — Y95 Nosocomial condition: Secondary | ICD-10-CM | POA: Diagnosis not present

## 2019-02-26 DIAGNOSIS — E785 Hyperlipidemia, unspecified: Secondary | ICD-10-CM | POA: Diagnosis present

## 2019-02-26 DIAGNOSIS — Z951 Presence of aortocoronary bypass graft: Secondary | ICD-10-CM | POA: Diagnosis not present

## 2019-02-26 DIAGNOSIS — M069 Rheumatoid arthritis, unspecified: Secondary | ICD-10-CM | POA: Diagnosis present

## 2019-02-26 DIAGNOSIS — Z66 Do not resuscitate: Secondary | ICD-10-CM | POA: Diagnosis present

## 2019-02-26 DIAGNOSIS — G4700 Insomnia, unspecified: Secondary | ICD-10-CM | POA: Diagnosis present

## 2019-02-26 DIAGNOSIS — Z87891 Personal history of nicotine dependence: Secondary | ICD-10-CM | POA: Diagnosis not present

## 2019-02-26 DIAGNOSIS — I129 Hypertensive chronic kidney disease with stage 1 through stage 4 chronic kidney disease, or unspecified chronic kidney disease: Secondary | ICD-10-CM | POA: Diagnosis present

## 2019-02-26 HISTORY — DX: Pneumonia, unspecified organism: J18.9

## 2019-03-04 ENCOUNTER — Encounter: Payer: Self-pay | Admitting: Internal Medicine

## 2019-03-04 DIAGNOSIS — I25110 Atherosclerotic heart disease of native coronary artery with unstable angina pectoris: Secondary | ICD-10-CM | POA: Diagnosis not present

## 2019-03-04 DIAGNOSIS — Z452 Encounter for adjustment and management of vascular access device: Secondary | ICD-10-CM | POA: Diagnosis not present

## 2019-03-04 DIAGNOSIS — Y95 Nosocomial condition: Secondary | ICD-10-CM | POA: Diagnosis not present

## 2019-03-04 DIAGNOSIS — B9562 Methicillin resistant Staphylococcus aureus infection as the cause of diseases classified elsewhere: Secondary | ICD-10-CM | POA: Diagnosis not present

## 2019-03-04 DIAGNOSIS — J850 Gangrene and necrosis of lung: Secondary | ICD-10-CM | POA: Diagnosis not present

## 2019-03-04 DIAGNOSIS — J189 Pneumonia, unspecified organism: Secondary | ICD-10-CM | POA: Diagnosis not present

## 2019-03-10 DIAGNOSIS — Z452 Encounter for adjustment and management of vascular access device: Secondary | ICD-10-CM | POA: Diagnosis not present

## 2019-03-10 DIAGNOSIS — I25110 Atherosclerotic heart disease of native coronary artery with unstable angina pectoris: Secondary | ICD-10-CM | POA: Diagnosis not present

## 2019-03-10 DIAGNOSIS — B9562 Methicillin resistant Staphylococcus aureus infection as the cause of diseases classified elsewhere: Secondary | ICD-10-CM | POA: Diagnosis not present

## 2019-03-10 DIAGNOSIS — J850 Gangrene and necrosis of lung: Secondary | ICD-10-CM | POA: Diagnosis not present

## 2019-03-10 DIAGNOSIS — J189 Pneumonia, unspecified organism: Secondary | ICD-10-CM | POA: Diagnosis not present

## 2019-03-10 DIAGNOSIS — Y95 Nosocomial condition: Secondary | ICD-10-CM | POA: Diagnosis not present

## 2019-03-13 DIAGNOSIS — J850 Gangrene and necrosis of lung: Secondary | ICD-10-CM | POA: Diagnosis not present

## 2019-03-13 DIAGNOSIS — Z66 Do not resuscitate: Secondary | ICD-10-CM | POA: Diagnosis not present

## 2019-03-13 DIAGNOSIS — I2510 Atherosclerotic heart disease of native coronary artery without angina pectoris: Secondary | ICD-10-CM | POA: Diagnosis not present

## 2019-03-13 DIAGNOSIS — Z6824 Body mass index (BMI) 24.0-24.9, adult: Secondary | ICD-10-CM | POA: Diagnosis not present

## 2019-03-13 DIAGNOSIS — I517 Cardiomegaly: Secondary | ICD-10-CM | POA: Diagnosis not present

## 2019-03-13 DIAGNOSIS — J852 Abscess of lung without pneumonia: Secondary | ICD-10-CM | POA: Diagnosis not present

## 2019-03-13 DIAGNOSIS — J189 Pneumonia, unspecified organism: Secondary | ICD-10-CM | POA: Diagnosis not present

## 2019-03-13 DIAGNOSIS — I700 Atherosclerosis of aorta: Secondary | ICD-10-CM | POA: Diagnosis not present

## 2019-03-13 DIAGNOSIS — R590 Localized enlarged lymph nodes: Secondary | ICD-10-CM | POA: Diagnosis not present

## 2019-03-17 DIAGNOSIS — J850 Gangrene and necrosis of lung: Secondary | ICD-10-CM | POA: Diagnosis not present

## 2019-03-17 DIAGNOSIS — I25110 Atherosclerotic heart disease of native coronary artery with unstable angina pectoris: Secondary | ICD-10-CM | POA: Diagnosis not present

## 2019-03-17 DIAGNOSIS — J189 Pneumonia, unspecified organism: Secondary | ICD-10-CM | POA: Diagnosis not present

## 2019-03-17 DIAGNOSIS — Z452 Encounter for adjustment and management of vascular access device: Secondary | ICD-10-CM | POA: Diagnosis not present

## 2019-03-17 DIAGNOSIS — Y95 Nosocomial condition: Secondary | ICD-10-CM | POA: Diagnosis not present

## 2019-03-17 DIAGNOSIS — B9562 Methicillin resistant Staphylococcus aureus infection as the cause of diseases classified elsewhere: Secondary | ICD-10-CM | POA: Diagnosis not present

## 2019-03-19 DIAGNOSIS — J9811 Atelectasis: Secondary | ICD-10-CM | POA: Diagnosis not present

## 2019-03-19 DIAGNOSIS — J852 Abscess of lung without pneumonia: Secondary | ICD-10-CM | POA: Diagnosis not present

## 2019-03-19 DIAGNOSIS — R918 Other nonspecific abnormal finding of lung field: Secondary | ICD-10-CM | POA: Diagnosis not present

## 2019-03-19 DIAGNOSIS — I700 Atherosclerosis of aorta: Secondary | ICD-10-CM | POA: Diagnosis not present

## 2019-03-20 ENCOUNTER — Ambulatory Visit (INDEPENDENT_AMBULATORY_CARE_PROVIDER_SITE_OTHER): Payer: Medicare Other

## 2019-03-20 ENCOUNTER — Other Ambulatory Visit: Payer: Self-pay

## 2019-03-20 ENCOUNTER — Ambulatory Visit (INDEPENDENT_AMBULATORY_CARE_PROVIDER_SITE_OTHER): Payer: Medicare Other | Admitting: Internal Medicine

## 2019-03-20 ENCOUNTER — Encounter: Payer: Self-pay | Admitting: Internal Medicine

## 2019-03-20 ENCOUNTER — Telehealth: Payer: Self-pay | Admitting: Internal Medicine

## 2019-03-20 VITALS — BP 126/68 | HR 67 | Temp 98.4°F | Ht 63.0 in | Wt 139.0 lb

## 2019-03-20 DIAGNOSIS — J984 Other disorders of lung: Secondary | ICD-10-CM

## 2019-03-20 DIAGNOSIS — J189 Pneumonia, unspecified organism: Secondary | ICD-10-CM | POA: Diagnosis not present

## 2019-03-20 HISTORY — DX: Pneumonia, unspecified organism: J18.9

## 2019-03-20 LAB — CBC WITH DIFFERENTIAL/PLATELET
BASOS ABS: 0.1 10*3/uL (ref 0.0–0.1)
Basophils Relative: 1.4 % (ref 0.0–3.0)
Eosinophils Absolute: 0.5 10*3/uL (ref 0.0–0.7)
Eosinophils Relative: 5.9 % — ABNORMAL HIGH (ref 0.0–5.0)
HEMATOCRIT: 29.7 % — AB (ref 36.0–46.0)
Hemoglobin: 10.3 g/dL — ABNORMAL LOW (ref 12.0–15.0)
LYMPHS ABS: 1.1 10*3/uL (ref 0.7–4.0)
LYMPHS PCT: 14 % (ref 12.0–46.0)
MCHC: 34.6 g/dL (ref 30.0–36.0)
MCV: 86.1 fl (ref 78.0–100.0)
Monocytes Absolute: 0.6 10*3/uL (ref 0.1–1.0)
Monocytes Relative: 7.9 % (ref 3.0–12.0)
NEUTROS ABS: 5.6 10*3/uL (ref 1.4–7.7)
NEUTROS PCT: 70.8 % (ref 43.0–77.0)
PLATELETS: 233 10*3/uL (ref 150.0–400.0)
RBC: 3.45 Mil/uL — ABNORMAL LOW (ref 3.87–5.11)
RDW: 14.7 % (ref 11.5–15.5)
WBC: 7.8 10*3/uL (ref 4.0–10.5)

## 2019-03-20 LAB — SEDIMENTATION RATE: SED RATE: 12 mm/h (ref 0–30)

## 2019-03-20 NOTE — Progress Notes (Signed)
LMTCB

## 2019-03-20 NOTE — Patient Instructions (Addendum)
Stop clindamycin and call for any symptoms like you had before you took the antibiotics.   Please remember to go to the lab and x-ray department   for your tests - we will call you with the results when they are available.     Please schedule a follow up office visit in 6 weeks, call sooner if needed with cxr.

## 2019-03-20 NOTE — Assessment & Plan Note (Addendum)
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  Should be ok for now off all abx/ call if any of prev symptoms recur.    Discussed in detail all the  indications, usual  risks and alternatives  relative to the benefits with patient who agrees to proceed with conservative f/u as outlined.   Total time devoted to counseling  > 50 % of initial 60 min office visit:  review extensive case hx and images with pt/ discussion of options/alternatives/ personally creating written customized instructions  in presence of pt  then going over those specific  Instructions directly with the pt including how to use all of the meds but in particular covering each new medication in detail and the difference between the maintenance= "automatic" meds and the prns using an action plan format for the latter (If this problem/symptom => do that organization reading Left to right).  Please see AVS from this visit for a full list of these instructions which I personally wrote for this pt and  are unique to this visit.

## 2019-03-20 NOTE — Progress Notes (Signed)
Angela Mccoy, female    DOB: Mar 21, 1946,    MRN: [id number]   Brief patient profile:  68 yowf quit smoking 1989 with RA in remission x decades no maint rx/immunosuppressives  acutely ill early Feb 2020 with shaking chills and sweats"for 3 weeks" (records don't support this as admitted with viz change/?TIA 02/16/2019 with no fever)   > 3/4/202020 eval for hemoptysis [facility] and admitted thru 03/04/2019 with dx of pna >> continued with  same symptoms x 4 days and then much better better by the 5th day of IV rx with meropenem x 15 days > no symptoms at time stopped IV rx > rx po clindamycin but ?  CT lag so referred to pulmonary clinic 03/20/2019 by Dr  Helene Kelp        History of Present Illness  03/20/2019  Pulmonary/ 1st office eval/Stehanie Ekstrom  Chief Complaint  Patient presents with   Pulmonary Consult    Referred by Dr. Helene Kelp for eval of lung abscess.   teeth last cleaned 3 m prior to onset / no dental or sinus symptoms,   no etohism, sz or other risk for aspiration  Dyspnea:  Not limited by breathing from desired activities   Cough: never coughed anything up but blood and that was gone w/in 2 days of starting abx despite continuing plavix. Sleep: bed is flat/ 2 pillows/ R side down  SABA use: none    No obvious day to day or daytime variability or assoc ongoing  excess/ purulent sputum or mucus plugs or hemoptysis or cp or chest tightness, subjective wheeze or overt sinus or hb symptoms.   Sleeping  without nocturnal  or early am exacerbation  of respiratory  c/o's or need for noct saba. Also denies any obvious fluctuation of symptoms with weather or environmental changes or other aggravating or alleviating factors except as outlined above   No unusual exposure hx or h/o childhood pna/ asthma or knowledge of premature birth.  Current Allergies, Complete Past Medical History, Past Surgical History, Family History, and Social History were reviewed in [employer]  record.  ROS  The following are not active complaints unless bolded Hoarseness, sore throat, dysphagia, dental problems, itching, sneezing,  nasal congestion or discharge of excess mucus or purulent secretions, ear ache,   fever, chills, sweats, unintended wt loss now stabilized or wt gain, classically pleuritic or exertional cp,  orthopnea pnd or arm/hand swelling  or leg swelling, presyncope, palpitations, abdominal pain, anorexia- resolved, nausea, vomiting, diarrhea since on clinda or change in bowel habits or change in bladder habits, change in stools or change in urine, dysuria, hematuria,  rash, arthralgias, visual complaints, headache, numbness, weakness or ataxia or problems with walking or coordination,  change in mood or  memory.           Past Medical History:  Diagnosis Date   Anemia 07/31/2017   Carotid bruit 02/26/2016   Carotid bruit present 02/26/2016   Coronary artery disease involving native coronary artery 02/26/2016   Coronary artery disease involving native coronary artery of native heart with angina pectoris ([place]) 03/12/2016   Overview:  PCI and DES to LAD and RCA in 2003, cath 2010 wo restenosis   Essential hypertension 03/12/2016   High cholesterol 01/17/2017   Hx of diverticulitis of colon 09/11/2016   Hyperlipidemia 03/12/2016   Hypertension 01/27/2016   Insomnia 02/26/2016   Migraine with aura 02/26/2016   Mitral valve prolapse 02/26/2016   Peripheral vascular disease ([place]) 02/26/2016  Rheumatoid arthritis involving multiple joints ([place]) 02/26/2016    Outpatient Medications Prior to Visit  Medication Sig Dispense Refill   clindamycin (CLEOCIN) 150 MG capsule Take 3 capsules by mouth 3 (three) times daily.     candesartan-hydrochlorothiazide (ATACAND HCT) 32-12.5 MG tablet Take 1 tablet by mouth daily. 30 tablet 6   citalopram (CELEXA) 40 MG tablet TAKE 1 TABLET BY MOUTH EVERY DAY     clopidogrel (PLAVIX) 75 MG tablet TAKE 1 TABLET BY MOUTH EVERY DAY      isosorbide mononitrate (IMDUR) 30 MG 24 hr tablet Take 1 tablet (30 mg total) by mouth daily. 90 tablet 2   nitroGLYCERIN (NITROSTAT) 0.4 MG SL tablet ONE TABLET UNDER TONGUE AS NEEDED FOR CHEST PAIN EVERY 5 MINUTES     pravastatin (PRAVACHOL) 40 MG tablet TAKE 1 TABLET BY MOUTH DAILY     propranolol (INDERAL) 10 MG tablet TAKE 1 TABLET BY MOUTH ONCE A DAY     QUEtiapine (SEROQUEL) 200 MG tablet Take 200 mg by mouth daily.      traMADol (ULTRAM) 50 MG tablet TAKE 1 TABLET BY MOUTH EVERY 6 HOURS AS NEEDED        Objective:     BP 126/68 (BP Location: Left Arm, Cuff Size: Normal)    Pulse 67    Temp 98.4 F (36.9 C) (Oral)    Ht 5\' 3"  (1.6 m)    Wt 139 lb (63 kg)    SpO2 98%    BMI 24.62 kg/m   SpO2: 98 %  RA   Pleasant amb wf nad  HEENT: top dentures/ lower partials / nl turbinates bilaterally, and oropharynx. Nl external ear canals without cough reflex   NECK :  without JVD/Nodes/TM/ nl carotid upstrokes bilaterally   LUNGS: no acc muscle use,  Nl contour chest which is clear to A and P bilaterally without cough on insp or exp maneuvers - No amphroic Breath sounds    CV:  RRR  no s3 or murmur or increase in P2, and no edema   ABD:  soft and nontender with nl inspiratory excursion in the supine position. No bruits or organomegaly appreciated, bowel sounds nl  MS:  Nl gait/ ext warm without deformities, calf tenderness, cyanosis or clubbing No obvious joint restrictions   SKIN: warm and dry without lesions    NEURO:  alert, approp, nl sensorium with  no motor or cerebellar deficits apparent.      CXR PA and Lateral:   03/20/2019 :    I personally reviewed images and agree with radiology impression as follows:    1. Unchanged irregular density in the peripheral right upper lobe.   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  Should be ok for now off all abx/ call if any of prev symptoms recur.    Discussed in detail all the  indications, usual  risks and alternatives  relative to the benefits with patient who agrees to proceed with conservative f/u as outlined.      Total time devoted to counseling  > 50 % of initial 60 min office visit:  review extensive case hx and images with pt/ discussion of options/alternatives/ personally creating written customized instructions  in presence of pt  then going over those specific  Instructions directly with the pt including how to use all of the meds but in particular covering each new medication in detail and the difference between the maintenance= "automatic" meds and the prns using an action plan format for the latter (If this problem/symptom => do that organization reading Left to right).  Please see AVS from this visit for a full list of these instructions which I personally wrote for this pt and  are unique to this visit.      Christinia Gully, MD 03/20/2019

## 2019-03-20 NOTE — Progress Notes (Signed)
Spoke with pt and notified of results per Dr. Wert. Pt verbalized understanding and denied any questions. 

## 2019-03-20 NOTE — Telephone Encounter (Signed)
Spoke with pt and notified of results of her cxr per Dr. Melvyn Novas. Pt verbalized understanding and denied any questions.

## 2019-03-22 LAB — QUANTIFERON-TB GOLD PLUS
NIL: 0.19 [IU]/mL
QUANTIFERON-TB GOLD PLUS: NEGATIVE

## 2019-03-24 DIAGNOSIS — Y95 Nosocomial condition: Secondary | ICD-10-CM | POA: Diagnosis not present

## 2019-03-24 DIAGNOSIS — I25110 Atherosclerotic heart disease of native coronary artery with unstable angina pectoris: Secondary | ICD-10-CM | POA: Diagnosis not present

## 2019-03-24 DIAGNOSIS — J850 Gangrene and necrosis of lung: Secondary | ICD-10-CM | POA: Diagnosis not present

## 2019-03-24 DIAGNOSIS — J189 Pneumonia, unspecified organism: Secondary | ICD-10-CM | POA: Diagnosis not present

## 2019-03-24 DIAGNOSIS — B9562 Methicillin resistant Staphylococcus aureus infection as the cause of diseases classified elsewhere: Secondary | ICD-10-CM | POA: Diagnosis not present

## 2019-03-24 DIAGNOSIS — Z452 Encounter for adjustment and management of vascular access device: Secondary | ICD-10-CM | POA: Diagnosis not present

## 2019-04-07 ENCOUNTER — Other Ambulatory Visit: Payer: Self-pay | Admitting: Cardiology

## 2019-04-07 DIAGNOSIS — I10 Essential (primary) hypertension: Secondary | ICD-10-CM

## 2019-04-07 MED ORDER — CANDESARTAN CILEXETIL-HCTZ 32-12.5 MG PO TABS: 1.0000 | ORAL_TABLET | Freq: Every day | ORAL | 0 refills | Status: DC

## 2019-04-07 NOTE — Telephone Encounter (Signed)
Sent Candesartan Hctz to [facility].

## 2019-04-07 NOTE — Telephone Encounter (Signed)
°*  STAT* If patient is at the pharmacy, call can be transferred to refill team.   1. Which medications need to be refilled? (please list name of each medication and dose if known) Candesartan hctz 32-12.5 tablets once daily  2. Which pharmacy/location (including street and city if local pharmacy) is medication to be sent to?[facility] [place] [facility]  3. Do they need a 30 day or 90 day supply? [facility]

## 2019-05-05 ENCOUNTER — Ambulatory Visit: Payer: Medicare Other | Admitting: Internal Medicine

## 2019-05-14 ENCOUNTER — Other Ambulatory Visit: Payer: Self-pay | Admitting: Internal Medicine

## 2019-05-14 ENCOUNTER — Ambulatory Visit (INDEPENDENT_AMBULATORY_CARE_PROVIDER_SITE_OTHER): Payer: Medicare Other | Admitting: Internal Medicine

## 2019-05-14 ENCOUNTER — Other Ambulatory Visit: Payer: Self-pay

## 2019-05-14 ENCOUNTER — Ambulatory Visit (INDEPENDENT_AMBULATORY_CARE_PROVIDER_SITE_OTHER): Payer: Medicare Other

## 2019-05-14 ENCOUNTER — Encounter: Payer: Self-pay | Admitting: Internal Medicine

## 2019-05-14 DIAGNOSIS — J189 Pneumonia, unspecified organism: Secondary | ICD-10-CM

## 2019-05-14 DIAGNOSIS — J984 Other disorders of lung: Secondary | ICD-10-CM | POA: Diagnosis not present

## 2019-05-14 DIAGNOSIS — J188 Other pneumonia, unspecified organism: Secondary | ICD-10-CM | POA: Diagnosis not present

## 2019-05-14 NOTE — Patient Instructions (Signed)
No further pulmonary follow up indicated - call if needed

## 2019-05-14 NOTE — Progress Notes (Signed)
Angela Mccoy, female    DOB: September 04, 1946,    MRN: [id number]   Brief patient profile:  69 yowf quit smoking 1989 with RA in remission x decades no maint rx/immunosuppressives  acutely ill early Feb 2020 with shaking chills and sweats"for 3 weeks" (records don't support this as admitted with viz change/?TIA 02/16/2019 with no fever)   > 3/4/202020 eval for hemoptysis [facility] and admitted thru 03/04/2019 with dx of pna >> continued with  same symptoms x 4 days and then much better better by the 5th day of IV rx with meropenem x 15 days > no symptoms at time stopped IV rx > rx po clindamycin but ?  CT lag so referred to pulmonary clinic 03/20/2019 by Dr  Helene Kelp        History of Present Illness  03/20/2019  Pulmonary/ 1st office eval/Ac [facility]  Chief Complaint  Patient presents with  . Pulmonary Consult    Referred by Dr. Helene Kelp for eval of lung abscess.   teeth last cleaned 3 m prior to onset / no dental or sinus symptoms,   no etohism, sz or other risk for aspiration Dyspnea:  Not limited by breathing from desired activities   Cough: never coughed anything up but blood and that was gone w/in 2 days of starting abx despite continuing plavix. Sleep: bed is flat/ 2 pillows/ R side down  SABA use: none  rec Stop clindamycin and call for any symptoms like you had before you took the antibiotics.   05/14/2019  f/u ov/Fumiko Cham re: f/u cavitary pna  Chief Complaint  Patient presents with  . Follow-up    CXR repeated. Breathing is doing well and no new co's.   Dyspnea:  Out walking neighborhood x 40 min 5 x week Cough: none Sleeping: on side, bed is flat / 2 pillows SABA use: no 02: no    No obvious day to day or daytime variability or assoc excess/ purulent sputum or mucus plugs or hemoptysis or cp or chest tightness, subjective wheeze or overt sinus or hb symptoms.   Sleeping as above  without nocturnal  or early am exacerbation  of respiratory  c/o's or need for noct saba. Also denies any  obvious fluctuation of symptoms with weather or environmental changes or other aggravating or alleviating factors except as outlined above   No unusual exposure hx or h/o childhood pna/ asthma or knowledge of premature birth.  Current Allergies, Complete Past Medical History, Past Surgical History, Family History, and Social History were reviewed in [employer] record.  ROS  The following are not active complaints unless bolded Hoarseness, sore throat, dysphagia, dental problems, itching, sneezing,  nasal congestion or discharge of excess mucus or purulent secretions, ear ache,   fever, chills, sweats, unintended wt loss or wt gain, classically pleuritic or exertional cp,  orthopnea pnd or arm/hand swelling  or leg swelling, presyncope, palpitations, abdominal pain, anorexia, nausea, vomiting, diarrhea  or change in bowel habits or change in bladder habits, change in stools or change in urine, dysuria, hematuria,  rash, arthralgias, visual complaints, headache, numbness, weakness or ataxia or problems with walking or coordination,  change in mood or  memory.        Current Meds  Medication Sig  . candesartan-hydrochlorothiazide (ATACAND HCT) 32-12.5 MG tablet Take 1 tablet by mouth daily.  . citalopram (CELEXA) 40 MG tablet TAKE 1 TABLET BY MOUTH EVERY DAY  . clopidogrel (PLAVIX) 75 MG tablet TAKE 1 TABLET BY  MOUTH EVERY DAY  . isosorbide mononitrate (IMDUR) 30 MG 24 hr tablet Take 1 tablet (30 mg total) by mouth daily.  . nitroGLYCERIN (NITROSTAT) 0.4 MG SL tablet ONE TABLET UNDER TONGUE AS NEEDED FOR CHEST PAIN EVERY 5 MINUTES  . pravastatin (PRAVACHOL) 40 MG tablet TAKE 1 TABLET BY MOUTH DAILY  . propranolol (INDERAL) 10 MG tablet TAKE 1 TABLET BY MOUTH ONCE A DAY  . QUEtiapine (SEROQUEL) 200 MG tablet Take 200 mg by mouth daily.   . traMADol (ULTRAM) 50 MG tablet TAKE 1 TABLET BY MOUTH EVERY 6 HOURS AS NEEDED                        Objective:    Pleasant  amb wf and nad   Wt Readings from Last 3 Encounters:  05/14/19 140 lb 6.4 oz (63.7 kg)  03/20/19 139 lb (63 kg)  11/18/18 143 lb (64.9 kg)     Vital signs reviewed - Note on arrival 02 sats  96% on RA    HEENT: nl dentition, turbinates bilaterally, and oropharynx. Nl external ear canals without cough reflex   NECK :  without JVD/Nodes/TM/ nl carotid upstrokes bilaterally   LUNGS: no acc muscle use,  Nl contour chest which is clear to A and P bilaterally without cough on insp or exp maneuvers   CV:  RRR  no s3 or murmur or increase in P2, and no edema   ABD:  soft and nontender with nl inspiratory excursion in the supine position. No bruits or organomegaly appreciated, bowel sounds nl  MS:  Nl gait/ ext warm without deformities, calf tenderness, cyanosis or clubbing No obvious joint restrictions   SKIN: warm and dry without lesions    NEURO:  alert, approp, nl sensorium with  no motor or cerebellar deficits apparent.          CXR PA and Lateral:   05/14/2019 :    I personally reviewed images and agree with radiology impression as follows:   Right upper lobe opacity noted on prior exam appears to be significantly improved suggesting improving pneumonia      Assessment

## 2019-05-15 ENCOUNTER — Encounter: Payer: Self-pay | Admitting: Internal Medicine

## 2019-05-15 NOTE — Assessment & Plan Note (Signed)
Onset of symptoms around late  Feb 2020 > all symptoms resolved p 5 days Meropenem and rx x 2 weeks then changed to cleocin -  D/c cleocin 03/20/2019  With esr 12, wbc 7,800  -  Quant TB 03/20/2019  Neg  - 05/14/2019 cxr with minimal streaky residual, no as dz   Based on the clinical history and the marked radiographic appearance this is totally consistent with a necrotizing pneumonia and additional chest x-rays are optional.  I will place a reminder at 3 months for a final follow-up at radiology suggestion but again this is not mandatory as I think the risk is extremely low that this is anything but resolving pneumonia.  Discussed in detail all the  indications, usual  risks and alternatives  relative to the benefits with patient who agrees to proceed with conservative f/u as outlined    I had an extended discussion with the patient/ and daughter by speaker phone/ reviewing all relevant studies completed to date and  lasting 15 to 20 minutes of a 25 minute visit    Each maintenance medication was reviewed in detail including most importantly the difference between maintenance and prns and under what circumstances the prns are to be triggered using an action plan format that is not reflected in the computer generated alphabetically organized AVS.     Please see AVS for specific instructions unique to this visit that I personally wrote and verbalized to the the pt in detail and then reviewed with pt  by my nurse highlighting any  changes in therapy recommended at today's visit to their plan of care.

## 2019-06-03 DIAGNOSIS — H2513 Age-related nuclear cataract, bilateral: Secondary | ICD-10-CM | POA: Diagnosis not present

## 2019-06-03 DIAGNOSIS — H25811 Combined forms of age-related cataract, right eye: Secondary | ICD-10-CM | POA: Diagnosis not present

## 2019-06-03 DIAGNOSIS — Z01818 Encounter for other preprocedural examination: Secondary | ICD-10-CM | POA: Diagnosis not present

## 2019-06-03 DIAGNOSIS — H2511 Age-related nuclear cataract, right eye: Secondary | ICD-10-CM | POA: Diagnosis not present

## 2019-06-10 DIAGNOSIS — H2512 Age-related nuclear cataract, left eye: Secondary | ICD-10-CM | POA: Diagnosis not present

## 2019-06-10 DIAGNOSIS — H25812 Combined forms of age-related cataract, left eye: Secondary | ICD-10-CM | POA: Diagnosis not present

## 2019-07-03 ENCOUNTER — Ambulatory Visit: Payer: Medicare Other | Admitting: Internal Medicine

## 2019-07-08 ENCOUNTER — Other Ambulatory Visit: Payer: Self-pay | Admitting: Cardiology

## 2019-07-08 DIAGNOSIS — I10 Essential (primary) hypertension: Secondary | ICD-10-CM

## 2019-07-10 NOTE — Telephone Encounter (Addendum)
Isosorbide and Candesartan refills sent to [facility]

## 2019-08-06 ENCOUNTER — Other Ambulatory Visit: Payer: Self-pay | Admitting: Cardiology

## 2019-08-06 DIAGNOSIS — I10 Essential (primary) hypertension: Secondary | ICD-10-CM

## 2019-08-18 ENCOUNTER — Telehealth: Payer: Self-pay | Admitting: *Deleted

## 2019-08-18 ENCOUNTER — Telehealth: Payer: Self-pay | Admitting: Internal Medicine

## 2019-08-18 DIAGNOSIS — J850 Gangrene and necrosis of lung: Secondary | ICD-10-CM

## 2019-08-18 NOTE — Telephone Encounter (Signed)
LMTCB

## 2019-08-18 NOTE — Telephone Encounter (Signed)
Call made to patient, made aware of f/u CXR. Patient is requesting to go to [facility] to have CXR done. I made her aware that we are not able to access images from [facility] but we could fax the cxr order and have them send the images and results to [facility]. Voiced understanding. She reports she can go have it done at [facility]. Order faxed. Nothing further needed at this time.

## 2019-08-18 NOTE — Telephone Encounter (Signed)
LMTCB for [employer]

## 2019-08-18 NOTE — Telephone Encounter (Signed)
-----   Message from Tanda Rockers, MD sent at 05/15/2019  7:37 AM EDT ----- F/u cxr due but optional dx necrotizing pna

## 2019-08-18 NOTE — Telephone Encounter (Signed)
Pt. Returning your call.

## 2019-08-19 NOTE — Telephone Encounter (Signed)
LMTCB x2 for Mardene Celeste.

## 2019-08-20 NOTE — Telephone Encounter (Signed)
LMTCB x3 for Angela Mccoy. We have attempted to contact Angela Mccoy several times with no success or call back from her. Per triage protocol, message will be closed.

## 2019-08-28 DIAGNOSIS — Z6824 Body mass index (BMI) 24.0-24.9, adult: Secondary | ICD-10-CM | POA: Diagnosis not present

## 2019-08-28 DIAGNOSIS — L659 Nonscarring hair loss, unspecified: Secondary | ICD-10-CM | POA: Diagnosis not present

## 2019-08-28 DIAGNOSIS — Z8701 Personal history of pneumonia (recurrent): Secondary | ICD-10-CM | POA: Diagnosis not present

## 2019-08-28 DIAGNOSIS — Z23 Encounter for immunization: Secondary | ICD-10-CM | POA: Diagnosis not present

## 2019-09-04 ENCOUNTER — Other Ambulatory Visit: Payer: Self-pay | Admitting: Cardiology

## 2019-09-04 DIAGNOSIS — I10 Essential (primary) hypertension: Secondary | ICD-10-CM

## 2019-10-03 ENCOUNTER — Other Ambulatory Visit: Payer: Self-pay | Admitting: Cardiology

## 2019-10-03 DIAGNOSIS — I10 Essential (primary) hypertension: Secondary | ICD-10-CM

## 2019-10-06 NOTE — Telephone Encounter (Signed)
Candesartan and Isosorbide refills sent

## 2019-11-02 ENCOUNTER — Other Ambulatory Visit: Payer: Self-pay | Admitting: Cardiology

## 2019-11-04 NOTE — Telephone Encounter (Signed)
Isosorbide refill sent to [facility] in [place]

## 2019-11-06 ENCOUNTER — Ambulatory Visit (INDEPENDENT_AMBULATORY_CARE_PROVIDER_SITE_OTHER): Payer: Medicare Other | Admitting: Cardiology

## 2019-11-06 ENCOUNTER — Encounter: Payer: Self-pay | Admitting: Cardiology

## 2019-11-06 ENCOUNTER — Other Ambulatory Visit: Payer: Self-pay

## 2019-11-06 ENCOUNTER — Ambulatory Visit (INDEPENDENT_AMBULATORY_CARE_PROVIDER_SITE_OTHER): Payer: Medicare Other

## 2019-11-06 VITALS — BP 168/76 | HR 70 | Ht 63.0 in | Wt 146.0 lb

## 2019-11-06 DIAGNOSIS — I25119 Atherosclerotic heart disease of native coronary artery with unspecified angina pectoris: Secondary | ICD-10-CM

## 2019-11-06 DIAGNOSIS — I4891 Unspecified atrial fibrillation: Secondary | ICD-10-CM | POA: Diagnosis not present

## 2019-11-06 DIAGNOSIS — E871 Hypo-osmolality and hyponatremia: Secondary | ICD-10-CM

## 2019-11-06 DIAGNOSIS — E785 Hyperlipidemia, unspecified: Secondary | ICD-10-CM

## 2019-11-06 DIAGNOSIS — R0602 Shortness of breath: Secondary | ICD-10-CM

## 2019-11-06 DIAGNOSIS — I10 Essential (primary) hypertension: Secondary | ICD-10-CM | POA: Diagnosis not present

## 2019-11-06 DIAGNOSIS — I4819 Other persistent atrial fibrillation: Secondary | ICD-10-CM

## 2019-11-06 DIAGNOSIS — D649 Anemia, unspecified: Secondary | ICD-10-CM

## 2019-11-06 DIAGNOSIS — M7989 Other specified soft tissue disorders: Secondary | ICD-10-CM

## 2019-11-06 HISTORY — DX: Other persistent atrial fibrillation: I48.19

## 2019-11-06 MED ORDER — NEBIVOLOL HCL 10 MG PO TABS: 10.0000 mg | ORAL_TABLET | Freq: Every day | ORAL | 1 refills | Status: DC

## 2019-11-06 MED ORDER — CLONIDINE HCL 0.1 MG PO TABS: 0.1000 mg | ORAL_TABLET | Freq: Every day | ORAL | 1 refills | Status: DC

## 2019-11-06 NOTE — Patient Instructions (Signed)
Medication Instructions:  Your physician has recommended you make the following change in your medication:  STOP propranolol  START nebivolol (bystolic) 10 mg: Take 1 tablet daily  START clonidine (catapres) 0.1 mg: Take 1 tablet daily at bedtime  *If you need a refill on your cardiac medications before your next appointment, please call your pharmacy*  Lab Work: Your physician recommends that you return for lab work today: lipid panel, CMP, ProBNP, CBC.   If you have labs (blood work) drawn today and your tests are completely normal, you will receive your results only by: Marland Kitchen MyChart Message (if you have MyChart) OR . A paper copy in the mail If you have any lab test that is abnormal or we need to change your treatment, we will call you to review the results.  Testing/Procedures: You had an EKG today.   Your physician has recommended that you wear a ZIO monitor. ZIO monitors are medical devices that record the heart's electrical activity. Doctors most often use these monitors to diagnose arrhythmias. Arrhythmias are problems with the speed or rhythm of the heartbeat. The monitor is a small, portable device. You can wear one while you do your normal daily activities. This is usually used to diagnose what is causing palpitations/syncope (passing out). Wear for 3 days.   Follow-Up: At [facility], you and your health needs are our priority.  As part of our continuing mission to provide you with exceptional heart care, we have created designated Provider Care Teams.  These Care Teams include your primary Cardiologist (physician) and Advanced Practice Providers (APPs -  Physician Assistants and Nurse Practitioners) who all work together to provide you with the care you need, when you need it.  Your next appointment:   1 week  The format for your next appointment:   In Person  Provider:   [name], MD    Nebivolol oral tablets What is this medicine? NEBIVOLOL (ne BIV oh lol) is  a beta-blocker. Beta-blockers reduce the workload on the heart and help it to beat more regularly. This medicine is used to treat high blood pressure. This medicine may be used for other purposes; ask your health care provider or pharmacist if you have questions. COMMON BRAND NAME(S): Bystolic What should I tell my health care provider before I take this medicine? They need to know if you have any of these conditions:  diabetes  heart or vessel disease like slow heartrate, worsening heart failure, heart block, sick sinus syndrome or Raynaud's disease  kidney disease  liver disease  lung disease like asthma or emphysema  pheochromocytoma  thyroid disease  an unusual or allergic reaction to nebivolol, other beta-blockers, medicines, foods, dyes, or preservatives  pregnant or trying to get pregnant  breast-feeding How should I use this medicine? Take this medicine by mouth with a glass of water. Follow the directions on the prescription label. You can take this medicine with or without food. Take your doses at regular intervals. Do not take your medicine more often than directed. Do not stop taking this medicine suddenly. This could lead to serious heart-related effects. Talk to your pediatrician regarding the use of this medicine in children. Special care may be needed. Overdosage: If you think you have taken too much of this medicine contact a poison control center or emergency room at once. NOTE: This medicine is only for you. Do not share this medicine with others. What if I miss a dose? If you miss a dose, take it as  soon as you can. If it is almost time for your next dose, take only that dose. Do not take double or extra doses. What may interact with this medicine? This medicine may interact with the following medications:  certain medicines for blood pressure, heart disease, irregular heart beat  certain medicines for depression, like fluoxetine or paroxetine  cimetidine   clonidine  reserpine  sildenafil This list may not describe all possible interactions. Give your health care provider a list of all the medicines, herbs, non-prescription drugs, or dietary supplements you use. Also tell them if you smoke, drink alcohol, or use illegal drugs. Some items may interact with your medicine. What should I watch for while using this medicine? Visit your doctor or health care professional for regular checks on your progress. Check your heart rate and blood pressure regularly while you are taking this medicine. Ask your doctor or health care professional what your heart rate and blood pressure should be, and when you should contact him or her. You may get drowsy or dizzy. Do not drive, use machinery, or do anything that needs mental alertness until you know how this drug affects you. Do not stand or sit up quickly, especially if you are an older patient. This reduces the risk of dizzy or fainting spells. Alcohol can make you more drowsy and dizzy. Avoid alcoholic drinks. This medicine may increase blood sugar. Ask your healthcare provider if changes in diet or medicines are needed if you have diabetes. Do not treat yourself for coughs, colds, or pain while you are taking this medicine without asking your doctor or health care professional for advice. Some ingredients may increase your blood pressure. What side effects may I notice from receiving this medicine? Side effects that you should report to your doctor or health care professional as soon as possible:  allergic reactions like skin rash, itching or hives, swelling of the face, lips, or tongue  breathing problems  chest pain  cold, tingling, or numb hands or feet  dark urine  general ill feeling or flu-like symptoms  irregular heartbeat  light-colored stools  loss of appetite  right upper belly pain   signs and symptoms of high blood sugar such as being more thirsty or hungry or having to urinate more  than normal. You may also feel very tired or have blurry vision.  slow heart rate  swollen legs or ankles  unusual bleeding or bruising  unusually weak or tired  vomiting  yellowing of the eyes or skin Side effects that usually do not require medical attention (report to your doctor or health care professional if they continue or are bothersome):  diarrhea  dizziness  dry or burning eyes  headache  nausea  tiredness  trouble sleeping This list may not describe all possible side effects. Call your doctor for medical advice about side effects. You may report side effects to FDA at 1-800-FDA-1088. Where should I keep my medicine? Keep out of the reach of children. Store at room temperature between 20 and 25 degrees C (68 and 77 degrees F). Protect from light. Keep container tightly closed. Throw away any unused medicine after the expiration date. NOTE: This sheet is a summary. It may not cover all possible information. If you have questions about this medicine, talk to your doctor, pharmacist, or health care provider.  2020 Elsevier/Gold Standard (2018-10-02 08:17:15)    Clonidine tablets What is this medicine? CLONIDINE (KLOE ni deen) is used to treat high blood pressure. This medicine  may be used for other purposes; ask your health care provider or pharmacist if you have questions. COMMON BRAND NAME(S): Catapres What should I tell my health care provider before I take this medicine? They need to know if you have any of these conditions:  kidney disease  an unusual or allergic reaction to clonidine, other medicines, foods, dyes, or preservatives  pregnant or trying to get pregnant  breast-feeding How should I use this medicine? Take this medicine by mouth with a glass of water. Follow the directions on the prescription label. Take your doses at regular intervals. Do not take your medicine more often than directed. Do not suddenly stop taking this medicine. You must  gradually reduce the dose or you may get a dangerous increase in blood pressure. Ask your doctor or health care professional for advice. Talk to your pediatrician regarding the use of this medicine in children. Special care may be needed. Overdosage: If you think you have taken too much of this medicine contact a poison control center or emergency room at once. NOTE: This medicine is only for you. Do not share this medicine with others. What if I miss a dose? If you miss a dose, take it as soon as you can. If it is almost time for your next dose, take only that dose. Do not take double or extra doses. What may interact with this medicine? Do not take this medicine with any of the following medications:  MAOIs like Carbex, Eldepryl, Marplan, Nardil, and Parnate This medicine may also interact with the following medications:  barbiturate medicines for inducing sleep or treating seizures like phenobarbital  certain medicines for blood pressure, heart disease, irregular heart beat  certain medicines for depression, anxiety, or psychotic disturbances  prescription pain medicines This list may not describe all possible interactions. Give your health care provider a list of all the medicines, herbs, non-prescription drugs, or dietary supplements you use. Also tell them if you smoke, drink alcohol, or use illegal drugs. Some items may interact with your medicine. What should I watch for while using this medicine? Visit your doctor or health care professional for regular checks on your progress. Check your heart rate and blood pressure regularly while you are taking this medicine. Ask your doctor or health care professional what your heart rate should be and when you should contact him or her. You may get drowsy or dizzy. Do not drive, use machinery, or do anything that needs mental alertness until you know how this medicine affects you. To avoid dizzy or fainting spells, do not stand or sit up quickly,  especially if you are an older person. Alcohol can make you more drowsy and dizzy. Avoid alcoholic drinks. Your mouth may get dry. Chewing sugarless gum or sucking hard candy, and drinking plenty of water will help. Do not treat yourself for coughs, colds, or pain while you are taking this medicine without asking your doctor or health care professional for advice. Some ingredients may increase your blood pressure. If you are going to have surgery tell your doctor or health care professional that you are taking this medicine. What side effects may I notice from receiving this medicine? Side effects that you should report to your doctor or health care professional as soon as possible:  allergic reactions like skin rash, itching or hives, swelling of the face, lips, or tongue  anxiety, nervousness  chest pain  depression  fast, irregular heartbeat  swelling of feet or legs  unusually weak or  tired Side effects that usually do not require medical attention (report to your doctor or health care professional if they continue or are bothersome):  change in sex drive or performance  constipation  headache This list may not describe all possible side effects. Call your doctor for medical advice about side effects. You may report side effects to FDA at 1-800-FDA-1088. Where should I keep my medicine? Keep out of the reach of children. Store at room temperature between 15 and 30 degrees C (59 and 86 degrees F). Protect from light. Keep container tightly closed. Throw away any unused medicine after the expiration date. NOTE: This sheet is a summary. It may not cover all possible information. If you have questions about this medicine, talk to your doctor, pharmacist, or health care provider.  2020 Elsevier/Gold Standard (2011-06-07 13:01:28)    Atrial Fibrillation  Atrial fibrillation is a type of heartbeat that is irregular or fast (rapid). If you have this condition, your heart beats  without any order. This makes it hard for your heart to pump blood in a normal way. Having this condition gives you more risk for stroke, heart failure, and other heart problems. Atrial fibrillation may start all of a sudden and then stop on its own, or it may become a long-lasting problem. What are the causes? This condition may be caused by heart conditions, such as:  High blood pressure.  Heart failure.  Heart valve disease.  Heart surgery. Other causes include:  Pneumonia.  Obstructive sleep apnea.  Lung cancer.  Thyroid disease.  Drinking too much alcohol. Sometimes the cause is not known. What increases the risk? You are more likely to develop this condition if:  You smoke.  You are older.  You have diabetes.  You are overweight.  You have a family history of this condition.  You exercise often and hard. What are the signs or symptoms? Common symptoms of this condition include:  A feeling like your heart is beating very fast.  Chest pain.  Feeling short of breath.  Feeling light-headed or weak.  Getting tired easily. Follow these instructions at home: Medicines  Take over-the-counter and prescription medicines only as told by your doctor.  If your doctor gives you a blood-thinning medicine, take it exactly as told. Taking too much of it can cause bleeding. Taking too little of it does not protect you against clots. Clots can cause a stroke. Lifestyle      Do not use any tobacco products. These include cigarettes, chewing tobacco, and e-cigarettes. If you need help quitting, ask your doctor.  Do not drink alcohol.  Do not drink beverages that have caffeine. These include coffee, soda, and tea.  Follow diet instructions as told by your doctor.  Exercise regularly as told by your doctor. General instructions  If you have a condition that causes breathing to stop for a short period of time (apnea), treat it as told by your doctor.  Keep a  healthy weight. Do not use diet pills unless your doctor says they are safe for you. Diet pills may make heart problems worse.  Keep all follow-up visits as told by your doctor. This is important. Contact a doctor if:  You notice a change in the speed, rhythm, or strength of your heartbeat.  You are taking a blood-thinning medicine and you see more bruising.  You get tired more easily when you move or exercise.  You have a sudden change in weight. Get help right away if:  You have pain in your chest or your belly (abdomen).  You have trouble breathing.  You have blood in your vomit, poop, or pee (urine).  You have any signs of a stroke. "BE FAST" is an easy way to remember the main warning signs: ? B - Balance. Signs are dizziness, sudden trouble walking, or loss of balance. ? E - Eyes. Signs are trouble seeing or a change in how you see. ? F - Face. Signs are sudden weakness or loss of feeling in the face, or the face or eyelid drooping on one side. ? A - Arms. Signs are weakness or loss of feeling in an arm. This happens suddenly and usually on one side of the body. ? S - Speech. Signs are sudden trouble speaking, slurred speech, or trouble understanding what people say. ? T - Time. Time to call emergency services. Write down what time symptoms started.  You have other signs of a stroke, such as: ? A sudden, very bad headache with no known cause. ? Feeling sick to your stomach (nausea). ? Throwing up (vomiting). ? Jerky movements you cannot control (seizure). These symptoms may be an emergency. Do not wait to see if the symptoms will go away. Get medical help right away. Call your local emergency services (911 in the [place] Do not drive yourself to the hospital. Summary  Atrial fibrillation is a type of heartbeat that is irregular or fast (rapid).  You are at higher risk of this condition if you smoke, are older, have diabetes, or are overweight.  Follow your doctor's  instructions about medicines, diet, exercise, and follow-up visits.  Get help right away if you think that you have signs of a stroke. This information is not intended to replace advice given to you by your health care provider. Make sure you discuss any questions you have with your health care provider. Document Released: 09/19/2008 Document Revised: 02/14/2018 Document Reviewed: 02/01/2018 Elsevier Patient Education  2020 [employer].

## 2019-11-06 NOTE — Progress Notes (Signed)
Cardiology Office Note:    Date:  11/06/2019   ID:  Angela Mccoy, DOB 11-22-1946, MRN [id number]  PCP:  Ronita Hipps, MD  Cardiologist:  [name], MD    Referring MD: Ronita Hipps, MD    ASSESSMENT:    1. Essential hypertension   2. Coronary artery disease involving native coronary artery of native heart with angina pectoris ([place])   3. Hyperlipidemia, unspecified hyperlipidemia type   4. Anemia, unspecified type   5. Hyponatremia    PLAN:    In order of problems listed above:  1. Atrial fibrillation persistent new finding await results of the labs her stroke risk would be moderate unless there is a contraindication to require anticoagulation continue beta-blocker with rate control awaiting results of 3-day ambulatory event monitor 2. Poorly controlled hypertension medications changed await results of labs may benefit from a distal diuretic of her serum sodium and renal function are normal 3. CAD she is having increasing frequency of angina mostly without activity recheck labs see back in the office in a week and if the pattern persist would benefit from coronary angiography with remote multivessel PCI 4. Continue her statin check lipid profile liver function 5. Recheck hemoglobin she does not look particularly anemic on exam 6. Recheck electrolytes consider MRA for blood pressure control if serum sodium creatinine normal   Next appointment: 1 week   Medication Adjustments/Labs and Tests Ordered: Current medicines are reviewed at length with the patient today.  Concerns regarding medicines are outlined above.  No orders of the defined types were placed in this encounter.  No orders of the defined types were placed in this encounter.   No chief complaint on file.   History of Present Illness:    Angela Mccoy is a 73 y.o. female with a hx of CAD with PCI and drug-eluting stent to the LAD and right coronary artery in 2003, dyslipidemia hypertension and chronic  lower extremity edema. MPI at [facility] 10/08/18 was normal.   last seen 11/18/2018. Compliance with diet, lifestyle and medications: Yes  She is seen in a well visit and is not doing well.  She tells me she had admission to the hospital earlier this year with pneumonia and required transfusion with anemia.  She also has had hyponatremia.  Home blood pressures are erratic and at times are greater than 1 75-8 90 systolic on current medications.  She has been having anginal discomfort 1-2 times a week mostly at rest and just has marked exercise intolerance.  She asked me to address her hypertension.  We will add low-dose Catapres at bedtime and switch from propranolol to Bystolic with a goal systolic of less than 832.  Again quickly recheck labs including serum sodium and a hemoglobin and see her back in my office in 1 week to reassess and consider the merits of coronary angiography for accelerating pattern of angina.  Date she has been relieved with nitroglycerin on all occasions.  Her EKG in my office today shows atrial fibrillation with a controlled rate.  This is a new finding I will hold on anticoagulation until I see the results of her labs.  I will asked my office to apply a 3-day ZIO to assess her heart rate control. Past Medical History:  Diagnosis Date  . Anemia 07/31/2017  . Carotid bruit 02/26/2016  . Carotid bruit present 02/26/2016  . Coronary artery disease involving native coronary artery 02/26/2016  . Coronary artery disease involving native coronary artery of  native heart with angina pectoris ([place]) 03/12/2016   Overview:  PCI and DES to LAD and RCA in 2003, cath 2010 wo restenosis  . Essential hypertension 03/12/2016  . High cholesterol 01/17/2017  . Hx of diverticulitis of colon 09/11/2016  . Hyperlipidemia 03/12/2016  . Hypertension 01/27/2016  . Insomnia 02/26/2016  . Migraine with aura 02/26/2016  . Mitral valve prolapse 02/26/2016  . Peripheral vascular disease ([place]) 02/26/2016  . Rheumatoid arthritis  involving multiple joints ([place]) 02/26/2016    Past Surgical History:  Procedure Laterality Date  . ABDOMINAL HYSTERECTOMY    . CHOLECYSTECTOMY      Current Medications: Current Meds  Medication Sig  . candesartan-hydrochlorothiazide (ATACAND HCT) 32-12.5 MG tablet TAKE 1 TABLET BY MOUTH DAILY  . citalopram (CELEXA) 40 MG tablet TAKE 1 TABLET BY MOUTH EVERY DAY  . clopidogrel (PLAVIX) 75 MG tablet TAKE 1 TABLET BY MOUTH EVERY DAY  . isosorbide mononitrate (IMDUR) 30 MG 24 hr tablet TAKE 1 TABLET(30 MG) BY MOUTH DAILY  . nitroGLYCERIN (NITROSTAT) 0.4 MG SL tablet ONE TABLET UNDER TONGUE AS NEEDED FOR CHEST PAIN EVERY 5 MINUTES  . pravastatin (PRAVACHOL) 40 MG tablet TAKE 1 TABLET BY MOUTH DAILY  . propranolol (INDERAL) 10 MG tablet TAKE 1 TABLET BY MOUTH ONCE A DAY  . QUEtiapine (SEROQUEL) 200 MG tablet Take 200 mg by mouth daily.   . traMADol (ULTRAM) 50 MG tablet TAKE 1 TABLET BY MOUTH EVERY 6 HOURS AS NEEDED     Allergies:   Penicillins   Social History   Socioeconomic History  . Marital status: Married    Spouse name: Not on file  . Number of children: Not on file  . Years of education: Not on file  . Highest education level: Not on file  Occupational History  . Not on file  Social Needs  . Financial resource strain: Not on file  . Food insecurity    Worry: Not on file    Inability: Not on file  . Transportation needs    Medical: Not on file    Non-medical: Not on file  Tobacco Use  . Smoking status: Former Smoker    Packs/day: 1.00    Years: 18.00    Pack years: 18.00    Types: Cigarettes    Quit date: 12/26/1987    Years since quitting: 31.8  . Smokeless tobacco: Never Used  Substance and Sexual Activity  . Alcohol use: No  . Drug use: Never  . Sexual activity: Not on file  Lifestyle  . Physical activity    Days per week: Not on file    Minutes per session: Not on file  . Stress: Not on file  Relationships  . Social Herbalist on phone: Not on  file    Gets together: Not on file    Attends religious service: Not on file    Active member of club or organization: Not on file    Attends meetings of clubs or organizations: Not on file    Relationship status: Not on file  Other Topics Concern  . Not on file  Social History Narrative  . Not on file     Family History: The patient's family history includes Breast cancer in her sister; Heart attack in her brother, mother, and sister; Lung cancer in her brother. There is no history of Asthma. ROS:   Please see the history of present illness.    All other systems reviewed and are negative.  EKGs/Labs/Other Studies Reviewed:    The following studies were reviewed today:  EKG:  EKG ordered today and personally reviewed.  The ekg ordered today demonstrates atrial fibrillation controlled ventricular rate  Recent Labs: 11/18/2018: ALT 11; BUN 21; Creatinine, Ser 1.11; Potassium 3.8; Sodium 136 03/20/2019: Hemoglobin 10.3; Platelets 233.0  Recent Lipid Panel    Component Value Date/Time   CHOL 126 11/18/2018 1504   TRIG 187 (H) 11/18/2018 1504   HDL 41 11/18/2018 1504   CHOLHDL 3.1 11/18/2018 1504   LDLCALC 48 11/18/2018 1504    Physical Exam:    VS:  BP (!) 168/76 (BP Location: Right Arm, Patient Position: Sitting, Cuff Size: Normal)   Pulse 70   Ht 5\' 3"  (1.6 m)   Wt 146 lb (66.2 kg)   SpO2 96%   BMI 25.86 kg/m     Wt Readings from Last 3 Encounters:  11/06/19 146 lb (66.2 kg)  05/14/19 140 lb 6.4 oz (63.7 kg)  03/20/19 139 lb (63 kg)     GEN:  Well nourished, well developed in no acute distress HEENT: Normal NECK: No JVD; No carotid bruits LYMPHATICS: No lymphadenopathy CARDIAC: Irregular rhythm RRR, no murmurs, rubs, gallops RESPIRATORY:  Clear to auscultation without rales, wheezing or rhonchi  ABDOMEN: Soft, non-tender, non-distended MUSCULOSKELETAL:  No edema; No deformity  SKIN: Warm and dry NEUROLOGIC:  Alert and oriented x 3 PSYCHIATRIC:  Normal  affect    Signed, [name], MD  11/06/2019 10:54 AM    [place]

## 2019-11-07 LAB — COMPREHENSIVE METABOLIC PANEL
ALT: 12 IU/L (ref 0–32)
AST: 20 IU/L (ref 0–40)
Albumin/Globulin Ratio: 1.6 (ref 1.2–2.2)
Albumin: 4.5 g/dL (ref 3.7–4.7)
Alkaline Phosphatase: 73 IU/L (ref 39–117)
BUN/Creatinine Ratio: 15 (ref 12–28)
BUN: 16 mg/dL (ref 8–27)
Bilirubin Total: <0.2 mg/dL (ref 0.0–1.2)
CO2: 27 mmol/L (ref 20–29)
Calcium: 10.7 mg/dL — ABNORMAL HIGH (ref 8.7–10.3)
Chloride: 90 mmol/L — ABNORMAL LOW (ref 96–106)
Creatinine, Ser: 1.04 mg/dL — ABNORMAL HIGH (ref 0.57–1.00)
GFR calc Af Amer: 62 mL/min/{1.73_m2} (ref >59)
GFR calc non Af Amer: 54 mL/min/{1.73_m2} — ABNORMAL LOW (ref >59)
Globulin, Total: 2.9 g/dL (ref 1.5–4.5)
Glucose: 99 mg/dL (ref 65–99)
Potassium: 4.6 mmol/L (ref 3.5–5.2)
Sodium: 130 mmol/L — ABNORMAL LOW (ref 134–144)
Total Protein: 7.4 g/dL (ref 6.0–8.5)

## 2019-11-07 LAB — PRO B NATRIURETIC PEPTIDE: NT-Pro BNP: 566 pg/mL — ABNORMAL HIGH (ref 0–301)

## 2019-11-07 LAB — CBC
Hematocrit: 25.3 % — ABNORMAL LOW (ref 34.0–46.6)
Hemoglobin: 9 g/dL — ABNORMAL LOW (ref 11.1–15.9)
MCH: 30.7 pg (ref 26.6–33.0)
MCHC: 35.6 g/dL (ref 31.5–35.7)
MCV: 86 fL (ref 79–97)
Platelets: 178 10*3/uL (ref 150–450)
RBC: 2.93 x10E6/uL — ABNORMAL LOW (ref 3.77–5.28)
RDW: 13.1 % (ref 11.7–15.4)
WBC: 6.3 10*3/uL (ref 3.4–10.8)

## 2019-11-07 LAB — LIPID PANEL
Chol/HDL Ratio: 3 ratio (ref 0.0–4.4)
Cholesterol, Total: 137 mg/dL (ref 100–199)
HDL: 46 mg/dL (ref >39)
LDL Chol Calc (NIH): 55 mg/dL (ref 0–99)
Triglycerides: 226 mg/dL — ABNORMAL HIGH (ref 0–149)
VLDL Cholesterol Cal: 36 mg/dL (ref 5–40)

## 2019-11-09 DIAGNOSIS — I25119 Atherosclerotic heart disease of native coronary artery with unspecified angina pectoris: Secondary | ICD-10-CM

## 2019-11-09 DIAGNOSIS — I4891 Unspecified atrial fibrillation: Secondary | ICD-10-CM

## 2019-11-11 ENCOUNTER — Telehealth: Payer: Self-pay

## 2019-11-11 MED ORDER — IRON 325 (65 FE) MG PO TABS: 325.0000 mg | ORAL_TABLET | Freq: Two times a day (BID) | ORAL | 0 refills | Status: DC

## 2019-11-11 NOTE — Telephone Encounter (Signed)
Patient's husband Jori Moll notified of lab results per Dr Bettina Gavia. Jori Moll advised to have patient start Iron 5 grains (325mg ) twice daily.  Patient will return to office on 11-13-2019 for office visit with Dr Bettina Gavia and further lab work will be drawn at that time.  Jori Moll verbalized understanding.  No further questions.

## 2019-11-13 ENCOUNTER — Ambulatory Visit: Payer: Medicare Other | Admitting: Cardiology

## 2019-11-13 ENCOUNTER — Other Ambulatory Visit: Payer: Self-pay

## 2019-11-13 NOTE — Progress Notes (Deleted)
Cardiology Office Note:    Date:  11/13/2019   ID:  Angela Mccoy, DOB 1946/01/22, MRN [id number]  PCP:  Ronita Hipps, MD  Cardiologist:  [name], MD    Referring MD: Ronita Hipps, MD    ASSESSMENT:    No diagnosis found. PLAN:    In order of problems listed above:  1. ***   Next appointment: ***   Medication Adjustments/Labs and Tests Ordered: Current medicines are reviewed at length with the patient today.  Concerns regarding medicines are outlined above.  No orders of the defined types were placed in this encounter.  No orders of the defined types were placed in this encounter.   No chief complaint on file.   History of Present Illness:    Angela Mccoy is a 73 y.o. female with a hx of CAD with PCI and drug-eluting stent to the LAD and right coronary artery in 2003, dyslipidemia hypertension and chronic lower extremity edema. MPI at [facility] 10/08/18 was normal last seen 11/06/2019.  At that time she had an increasing frequency of anginal discomfort and routine labs showed the presence of significant anemia hemoglobin 9.0.  Her proBNP level was moderately elevated. Compliance with diet, lifestyle and medications: *** Past Medical History:  Diagnosis Date  . Anemia 07/31/2017  . Carotid bruit 02/26/2016  . Carotid bruit present 02/26/2016  . Coronary artery disease involving native coronary artery 02/26/2016  . Coronary artery disease involving native coronary artery of native heart with angina pectoris ([place]) 03/12/2016   Overview:  PCI and DES to LAD and RCA in 2003, cath 2010 wo restenosis  . Essential hypertension 03/12/2016  . High cholesterol 01/17/2017  . Hx of diverticulitis of colon 09/11/2016  . Hyperlipidemia 03/12/2016  . Hypertension 01/27/2016  . Insomnia 02/26/2016  . Migraine with aura 02/26/2016  . Mitral valve prolapse 02/26/2016  . Peripheral vascular disease ([place]) 02/26/2016  . Rheumatoid arthritis involving multiple joints ([place]) 02/26/2016    Past Surgical  History:  Procedure Laterality Date  . ABDOMINAL HYSTERECTOMY    . CHOLECYSTECTOMY      Current Medications: No outpatient medications have been marked as taking for the 11/13/19 encounter (Appointment) with Richardo Priest, MD.     Allergies:   Penicillins   Social History   Socioeconomic History  . Marital status: Married    Spouse name: Not on file  . Number of children: Not on file  . Years of education: Not on file  . Highest education level: Not on file  Occupational History  . Not on file  Social Needs  . Financial resource strain: Not on file  . Food insecurity    Worry: Not on file    Inability: Not on file  . Transportation needs    Medical: Not on file    Non-medical: Not on file  Tobacco Use  . Smoking status: Former Smoker    Packs/day: 1.00    Years: 18.00    Pack years: 18.00    Types: Cigarettes    Quit date: 12/26/1987    Years since quitting: 31.9  . Smokeless tobacco: Never Used  Substance and Sexual Activity  . Alcohol use: No  . Drug use: Never  . Sexual activity: Not on file  Lifestyle  . Physical activity    Days per week: Not on file    Minutes per session: Not on file  . Stress: Not on file  Relationships  . Social connections  Talks on phone: Not on file    Gets together: Not on file    Attends religious service: Not on file    Active member of club or organization: Not on file    Attends meetings of clubs or organizations: Not on file    Relationship status: Not on file  Other Topics Concern  . Not on file  Social History Narrative  . Not on file     Family History: The patient's ***family history includes Breast cancer in her sister; Heart attack in her brother, mother, and sister; Lung cancer in her brother. There is no history of Asthma. ROS:   Please see the history of present illness.    All other systems reviewed and are negative.  EKGs/Labs/Other Studies Reviewed:    The following studies were reviewed today:   EKG:  EKG ordered today and personally reviewed.  The ekg ordered today demonstrates ***  Recent Labs: 11/06/2019: ALT 12; BUN 16; Creatinine, Ser 1.04; Hemoglobin 9.0; NT-Pro BNP 566; Platelets 178; Potassium 4.6; Sodium 130  Recent Lipid Panel    Component Value Date/Time   CHOL 137 11/06/2019 1122   TRIG 226 (H) 11/06/2019 1122   HDL 46 11/06/2019 1122   CHOLHDL 3.0 11/06/2019 1122   LDLCALC 55 11/06/2019 1122    Physical Exam:    VS:  There were no vitals taken for this visit.    Wt Readings from Last 3 Encounters:  11/06/19 146 lb (66.2 kg)  05/14/19 140 lb 6.4 oz (63.7 kg)  03/20/19 139 lb (63 kg)     GEN: *** Well nourished, well developed in no acute distress HEENT: Normal NECK: No JVD; No carotid bruits LYMPHATICS: No lymphadenopathy CARDIAC: ***RRR, no murmurs, rubs, gallops RESPIRATORY:  Clear to auscultation without rales, wheezing or rhonchi  ABDOMEN: Soft, non-tender, non-distended MUSCULOSKELETAL:  No edema; No deformity  SKIN: Warm and dry NEUROLOGIC:  Alert and oriented x 3 PSYCHIATRIC:  Normal affect    Signed, [name], MD  11/13/2019 1:47 PM    [facility]

## 2019-11-17 DIAGNOSIS — I4819 Other persistent atrial fibrillation: Secondary | ICD-10-CM | POA: Diagnosis not present

## 2019-11-18 NOTE — Progress Notes (Signed)
Cardiology Office Note:    Date:  11/19/2019   ID:  Angela Mccoy, DOB 1946-02-15, MRN [id number]  PCP:  Ronita Hipps, MD  Cardiologist:  [name], MD    Referring MD: Ronita Hipps, MD    ASSESSMENT:    1. Essential hypertension   2. Coronary artery disease involving native coronary artery of native heart with angina pectoris ([place])   3. Anemia, unspecified type    PLAN:    In order of problems listed above:  1. Remains elevated she needs to resume her ARB thiazide diuretic and continue her other antihypertensives including by systolic. 2. Clinically she has had a change in pattern and now has angina occurring with relatively minor activities class II to class III on medications.  In view of her known CAD multiple PCI's will proceed to coronary angiography and she will have further revascularization as she has flow-limiting stenosis.  She does not have faith and myocardial perfusion studies are falsely normal in the past and delayed appropriate cardiac therapy.  She has no contraindication to dual antiplatelet therapy and no dye allergy. 3. Continue iron check studies including ferritin TIBC and iron serum level.   Next appointment: 6 weeks to see me   Medication Adjustments/Labs and Tests Ordered: Current medicines are reviewed at length with the patient today.  Concerns regarding medicines are outlined above.  No orders of the defined types were placed in this encounter.  No orders of the defined types were placed in this encounter.   No chief complaint on file.   History of Present Illness:    Angela Mccoy is a 73 y.o. female with a hx of CAD with PCI and drug-eluting stent to the LAD and right coronary artery in 2003, dyslipidemia hypertension and chronic lower extremity edema. MPI at [facility] 10/08/18 was normal last seen 11/06/2019 with  poorly controlled hypertension and increasing frequency of angina..  He showed anemia with a hemoglobin of 9.0 and moderately  elevated N-terminal BNP 566.  Preliminary event monitor report 3 days shows sinus rhythm minimum average maximum heart rates of 46, 60 and 99 bpm.  Ventricular and supraventricular arrhythmia was rare there is 1 4 beat run of APCs and there were frequent triggered and diary events generally unassociated with arrhythmia rarely with atrial premature contractions. Compliance with diet, lifestyle and medications: Yes  In some fashion she discontinued her Imdur and her ARB thiazide diuretic home blood pressures are better than running mostly in the 1 68-1 55 systolic range.  She continues to have frequent angina and takes nitroglycerin several times a week and it happens with minor activities this is a big change in her anginal pattern we reviewed the opportunities for evaluation including myocardial perfusion study and coronary angiography in view of her known history of CAD multiple PCI's both of [place] prefer direct referral to coronary angiography options benefits and risks detailed she is not allergic to contrast dye and she has been maintained on long-term clopidogrel therapy.  She has a history of anemia has been transfused in the past I started her on iron we will recheck a CBC today she has had indigestion but not recently and I will place her on a PPI and will check iron studies with her precatheterization labs.  She has had no bleeding GI or GU. Past Medical History:  Diagnosis Date  . Anemia 07/31/2017  . Carotid bruit 02/26/2016  . Carotid bruit present 02/26/2016  . Coronary artery disease involving  native coronary artery 02/26/2016  . Coronary artery disease involving native coronary artery of native heart with angina pectoris ([place]) 03/12/2016   Overview:  PCI and DES to LAD and RCA in 2003, cath 2010 wo restenosis  . Essential hypertension 03/12/2016  . High cholesterol 01/17/2017  . Hx of diverticulitis of colon 09/11/2016  . Hyperlipidemia 03/12/2016  . Hypertension 01/27/2016  . Insomnia 02/26/2016  .  Migraine with aura 02/26/2016  . Mitral valve prolapse 02/26/2016  . Peripheral vascular disease ([place]) 02/26/2016  . Rheumatoid arthritis involving multiple joints ([place]) 02/26/2016    Past Surgical History:  Procedure Laterality Date  . ABDOMINAL HYSTERECTOMY    . CHOLECYSTECTOMY      Current Medications: Current Meds  Medication Sig  . citalopram (CELEXA) 40 MG tablet TAKE 1 TABLET BY MOUTH EVERY DAY  . cloNIDine (CATAPRES) 0.1 MG tablet Take 1 tablet (0.1 mg total) by mouth at bedtime.  . clopidogrel (PLAVIX) 75 MG tablet TAKE 1 TABLET BY MOUTH EVERY DAY  . Ferrous Sulfate (IRON) 325 (65 Fe) MG TABS Take 1 tablet (325 mg total) by mouth 2 (two) times daily.  . nebivolol (BYSTOLIC) 10 MG tablet Take 1 tablet (10 mg total) by mouth daily.  . nitroGLYCERIN (NITROSTAT) 0.4 MG SL tablet ONE TABLET UNDER TONGUE AS NEEDED FOR CHEST PAIN EVERY 5 MINUTES  . pravastatin (PRAVACHOL) 40 MG tablet TAKE 1 TABLET BY MOUTH DAILY  . QUEtiapine (SEROQUEL) 200 MG tablet Take 200 mg by mouth daily.   . traMADol (ULTRAM) 50 MG tablet TAKE 1 TABLET BY MOUTH EVERY 6 HOURS AS NEEDED     Allergies:   Penicillins   Social History   Socioeconomic History  . Marital status: Married    Spouse name: Not on file  . Number of children: Not on file  . Years of education: Not on file  . Highest education level: Not on file  Occupational History  . Not on file  Social Needs  . Financial resource strain: Not on file  . Food insecurity    Worry: Not on file    Inability: Not on file  . Transportation needs    Medical: Not on file    Non-medical: Not on file  Tobacco Use  . Smoking status: Former Smoker    Packs/day: 1.00    Years: 18.00    Pack years: 18.00    Types: Cigarettes    Quit date: 12/26/1987    Years since quitting: 31.9  . Smokeless tobacco: Never Used  Substance and Sexual Activity  . Alcohol use: No  . Drug use: Never  . Sexual activity: Not on file  Lifestyle  . Physical activity     Days per week: Not on file    Minutes per session: Not on file  . Stress: Not on file  Relationships  . Social Herbalist on phone: Not on file    Gets together: Not on file    Attends religious service: Not on file    Active member of club or organization: Not on file    Attends meetings of clubs or organizations: Not on file    Relationship status: Not on file  Other Topics Concern  . Not on file  Social History Narrative  . Not on file     Family History: The patient's family history includes Breast cancer in her sister; Heart attack in her brother, mother, and sister; Lung cancer in her brother. There is no history  of Asthma. ROS:   Please see the history of present illness.    All other systems reviewed and are negative.  EKGs/Labs/Other Studies Reviewed:    The following studies were reviewed today:  EKG:  EKG 11/06/2019 personally reviewed sinus rhythm no ischemic changes normal  Recent Labs: 11/06/2019: ALT 12; BUN 16; Creatinine, Ser 1.04; Hemoglobin 9.0; NT-Pro BNP 566; Platelets 178; Potassium 4.6; Sodium 130  Recent Lipid Panel    Component Value Date/Time   CHOL 137 11/06/2019 1122   TRIG 226 (H) 11/06/2019 1122   HDL 46 11/06/2019 1122   CHOLHDL 3.0 11/06/2019 1122   LDLCALC 55 11/06/2019 1122    Physical Exam:    VS:  BP (!) 178/74 (BP Location: Right Arm, Patient Position: Sitting, Cuff Size: Normal)   Pulse (!) 53   Ht 5\' 3"  (1.6 m)   Wt 149 lb 6.4 oz (67.8 kg)   SpO2 98%   BMI 26.47 kg/m     Wt Readings from Last 3 Encounters:  11/19/19 149 lb 6.4 oz (67.8 kg)  11/06/19 146 lb (66.2 kg)  05/14/19 140 lb 6.4 oz (63.7 kg)     GEN: She looks much less pale and healthier than the last visit well nourished, well developed in no acute distress HEENT: Normal NECK: No JVD; No carotid bruits LYMPHATICS: No lymphadenopathy CARDIAC: RRR, no murmurs, rubs, gallops RESPIRATORY:  Clear to auscultation without rales, wheezing or rhonchi   ABDOMEN: Soft, non-tender, non-distended MUSCULOSKELETAL:  No edema; No deformity  SKIN: Warm and dry NEUROLOGIC:  Alert and oriented x 3 PSYCHIATRIC:  Normal affect    Signed, [name], MD  11/19/2019 8:30 AM    [place]

## 2019-11-19 ENCOUNTER — Ambulatory Visit (INDEPENDENT_AMBULATORY_CARE_PROVIDER_SITE_OTHER): Payer: Medicare Other | Admitting: Cardiology

## 2019-11-19 ENCOUNTER — Encounter: Payer: Self-pay | Admitting: Cardiology

## 2019-11-19 ENCOUNTER — Other Ambulatory Visit: Payer: Self-pay

## 2019-11-19 VITALS — BP 178/74 | HR 53 | Ht 63.0 in | Wt 149.4 lb

## 2019-11-19 DIAGNOSIS — D649 Anemia, unspecified: Secondary | ICD-10-CM | POA: Diagnosis not present

## 2019-11-19 DIAGNOSIS — J9811 Atelectasis: Secondary | ICD-10-CM | POA: Diagnosis not present

## 2019-11-19 DIAGNOSIS — I25119 Atherosclerotic heart disease of native coronary artery with unspecified angina pectoris: Secondary | ICD-10-CM | POA: Diagnosis not present

## 2019-11-19 DIAGNOSIS — Z01818 Encounter for other preprocedural examination: Secondary | ICD-10-CM | POA: Diagnosis not present

## 2019-11-19 DIAGNOSIS — I10 Essential (primary) hypertension: Secondary | ICD-10-CM

## 2019-11-19 HISTORY — DX: Encounter for other preprocedural examination: Z01.818

## 2019-11-19 MED ORDER — NITROGLYCERIN 0.4 MG SL SUBL: SUBLINGUAL_TABLET | SUBLINGUAL | 3 refills | Status: DC

## 2019-11-19 MED ORDER — PANTOPRAZOLE SODIUM 40 MG PO TBEC: 40.0000 mg | DELAYED_RELEASE_TABLET | Freq: Every day | ORAL | 0 refills | Status: DC

## 2019-11-19 MED ORDER — ISOSORBIDE MONONITRATE ER 30 MG PO TB24: ORAL_TABLET | ORAL | 0 refills | Status: DC

## 2019-11-19 MED ORDER — CANDESARTAN CILEXETIL-HCTZ 32-12.5 MG PO TABS: 1.0000 | ORAL_TABLET | Freq: Every day | ORAL | 0 refills | Status: DC

## 2019-11-19 NOTE — Addendum Note (Signed)
Addended by: Austin Miles on: 11/19/2019 11:44 AM   Modules accepted: Orders

## 2019-11-19 NOTE — Patient Instructions (Signed)
Medication Instructions:  Your physician has recommended you make the following change in your medication:   STOP propranolol  START pantoprazole (protonix) 40 mg: Take 1 tablet daily  RESTART isosorbide mononitrate (imdur) 30 mg: Take 1 tablet daily RESTART candesartan-hydrochlorothiazide (atacand-HCT) 32-12.5 mg: Take 1 tablet daily   *If you need a refill on your cardiac medications before your next appointment, please call your pharmacy*  Lab Work: Your physician recommends that you return for lab work today: CBC, BMP, Iron, TIBC, Ferritin level.  If you have labs (blood work) drawn today and your tests are completely normal, you will receive your results only by: Marland Kitchen MyChart Message (if you have MyChart) OR . A paper copy in the mail If you have any lab test that is abnormal or we need to change your treatment, we will call you to review the results.  Testing/Procedures: You will go to the [facility] for pre-procedural drive-thru COVID testing on Tuesday, 11/25/2019, at 8:50 am. Please go to [place], [place] and arrive 15 minutes early. Go to the building overhang. DO NOT take to the tent or tent line. Self-isolate after testing until your catheterization.      [place] [address] Dept: [phone] Loc: [place]  11/19/2019  You are scheduled for a Cardiac Catheterization on Friday, December 4 with Dr. [name].  1. Please arrive at the [facility] (Main Entrance A) at [facility]: [address] [place], [place] [address] at 7:00 AM (This time is two hours before your procedure to ensure your preparation). Free valet parking service is available.   Special note: Every effort is made to have your procedure done on time. Please understand that emergencies sometimes delay scheduled procedures.  2. Diet: Do not  eat solid foods after midnight.  The patient may have clear liquids until 5am upon the day of the procedure.  3. Labs: None needed.   4. Medication instructions in preparation for your procedure:   Contrast Allergy: No      On the morning of your procedure, take your Plavix/Clopidogrel and any morning medicines NOT listed above.  You may use sips of water.  5. Plan for one night stay--bring personal belongings. 6. Bring a current list of your medications and current insurance cards. 7. You MUST have a responsible person to drive you home. 8. Someone MUST be with you the first 24 hours after you arrive home or your discharge will be delayed. 9. Please wear clothes that are easy to get on and off and wear slip-on shoes.  Thank you for allowing [place] to care for you!   -- [facility]   Follow-Up: At [facility], you and your health needs are our priority.  As part of our continuing mission to provide you with exceptional heart care, we have created designated Provider Care Teams.  These Care Teams include your primary Cardiologist (physician) and Advanced Practice Providers (APPs -  Physician Assistants and Nurse Practitioners) who all work together to provide you with the care you need, when you need it.  Your next appointment:   6 week(s)  The format for your next appointment:   In Person  Provider:   [name], MD   Pantoprazole tablets What is this medicine? PANTOPRAZOLE (pan TOE pra zole) prevents the production of acid in the stomach. It is used  to treat gastroesophageal reflux disease (GERD), inflammation of the esophagus, and Zollinger-Ellison syndrome. This medicine may be used for other purposes; ask your health care provider or pharmacist if you have questions. COMMON BRAND NAME(S): Protonix What should I tell my health care provider before I take this medicine? They need to know if you have any of these conditions:  liver  disease  low levels of magnesium in the blood  lupus  an unusual or allergic reaction to omeprazole, lansoprazole, pantoprazole, rabeprazole, other medicines, foods, dyes, or preservatives  pregnant or trying to get pregnant  breast-feeding How should I use this medicine? Take this medicine by mouth. Swallow the tablets whole with a drink of water. Follow the directions on the prescription label. Do not crush, break, or chew. Take your medicine at regular intervals. Do not take your medicine more often than directed. Talk to your pediatrician regarding the use of this medicine in children. While this drug may be prescribed for children as young as 5 years for selected conditions, precautions do apply. Overdosage: If you think you have taken too much of this medicine contact a poison control center or emergency room at once. NOTE: This medicine is only for you. Do not share this medicine with others. What if I miss a dose? If you miss a dose, take it as soon as you can. If it is almost time for your next dose, take only that dose. Do not take double or extra doses. What may interact with this medicine? Do not take this medicine with any of the following medications:  atazanavir  nelfinavir This medicine may also interact with the following medications:  ampicillin  delavirdine  erlotinib  iron salts  medicines for fungal infections like ketoconazole, itraconazole and voriconazole  methotrexate  mycophenolate mofetil  warfarin This list may not describe all possible interactions. Give your health care provider a list of all the medicines, herbs, non-prescription drugs, or dietary supplements you use. Also tell them if you smoke, drink alcohol, or use illegal drugs. Some items may interact with your medicine. What should I watch for while using this medicine? It can take several days before your stomach pain gets better. Check with your doctor or health care provider if your  condition does not start to get better, or if it gets worse. This medicine may cause serious skin reactions. They can happen weeks to months after starting the medicine. Contact your health care provider right away if you notice fevers or flu-like symptoms with a rash. The rash may be red or purple and then turn into blisters or peeling of the skin. Or, you might notice a red rash with swelling of the face, lips or lymph nodes in your neck or under your arms. You may need blood work done while you are taking this medicine. This medicine may cause a decrease in vitamin B12. You should make sure that you get enough vitamin B12 while you are taking this medicine. Discuss the foods you eat and the vitamins you take with your health care provider. What side effects may I notice from receiving this medicine? Side effects that you should report to your doctor or health care professional as soon as possible:   allergic reactions like skin rash, itching or hives, swelling of the face, lips, or tongue   bone, muscle or joint pain   breathing problems   chest pain or chest tightness   dark yellow or brown urine   dizziness   fast, irregular  heartbeat   feeling faint or lightheaded   fever or sore throat   muscle spasm   palpitations   rash on cheeks or arms that gets worse in the sun   redness, blistering, peeling or loosening of the skin, including inside the mouth   seizures  stomach polyps   tremors   unusual bleeding or bruising   unusually weak or tired   yellowing of the eyes or skin Side effects that usually do not require medical attention (report to your doctor or health care professional if they continue or are bothersome):   constipation   diarrhea   dry mouth   headache   nausea This list may not describe all possible side effects. Call your doctor for medical advice about side effects. You may report side effects to FDA at  1-800-FDA-1088. Where should I keep my medicine? Keep out of the reach of children. Store at room temperature between 15 and 30 degrees C (59 and 86 degrees F). Protect from light and moisture. Throw away any unused medicine after the expiration date. NOTE: This sheet is a summary. It may not cover all possible information. If you have questions about this medicine, talk to your doctor, pharmacist, or health care provider.  2020 Elsevier/Gold Standard (2019-03-21 13:18:32)    Coronary Angiogram With Stent Coronary angiogram with stent placement is a procedure to widen or open a narrow blood vessel of the heart (coronary artery). Arteries may become blocked by cholesterol buildup (plaques) in the lining of the wall. When a coronary artery becomes partially blocked, blood flow to that area decreases. This may lead to chest pain or a heart attack (myocardial infarction). A stent is a small piece of metal that looks like mesh or a spring. Stent placement may be done as treatment for a heart attack or right after a coronary angiogram in which a blocked artery is found. Let your health care provider know about:  Any allergies you have.  All medicines you are taking, including vitamins, herbs, eye drops, creams, and over-the-counter medicines.  Any problems you or family members have had with anesthetic medicines.  Any blood disorders you have.  Any surgeries you have had.  Any medical conditions you have.  Whether you are pregnant or may be pregnant. What are the risks? Generally, this is a safe procedure. However, problems may occur, including:  Damage to the heart or its blood vessels.  A return of blockage.  Bleeding, infection, or bruising at the insertion site.  A collection of blood under the skin (hematoma) at the insertion site.  A blood clot in another part of the body.  Kidney injury.  Allergic reaction to the dye or contrast that is used.  Bleeding into the abdomen  (retroperitoneal bleeding). What happens before the procedure? Staying hydrated Follow instructions from your health care provider about hydration, which may include:  Up to 2 hours before the procedure - you may continue to drink clear liquids, such as water, clear fruit juice, black coffee, and plain tea.  Eating and drinking restrictions Follow instructions from your health care provider about eating and drinking, which may include:  8 hours before the procedure - stop eating heavy meals or foods such as meat, fried foods, or fatty foods.  6 hours before the procedure - stop eating light meals or foods, such as toast or cereal.  2 hours before the procedure - stop drinking clear liquids. Ask your health care provider about:  Changing or stopping  your regular medicines. This is especially important if you are taking diabetes medicines or blood thinners.  Taking medicines such as ibuprofen. These medicines can thin your blood. Do not take these medicines before your procedure if your health care provider instructs you not to. Generally, aspirin is recommended before a procedure of passing a small, thin tube (catheter) through a blood vessel and into the heart (cardiac catheterization). What happens during the procedure?   An IV tube will be inserted into one of your veins.  You will be given one or more of the following: ? A medicine to help you relax (sedative). ? A medicine to numb the area where the catheter will be inserted into an artery (local anesthetic).  To reduce your risk of infection: ? Your health care team will wash or sanitize their hands. ? Your skin will be washed with soap. ? Hair may be removed from the area where the catheter will be inserted.  Using a guide wire, the catheter will be inserted into an artery. The location may be in your groin, in your wrist, or in the fold of your arm (near your elbow).  A type of X-ray (fluoroscopy) will be used to help guide  the catheter to the opening of the arteries in the heart.  A dye will be injected into the catheter, and X-rays will be taken. The dye will help to show where any narrowing or blockages are located in the arteries.  A tiny wire will be guided to the blocked spot, and a balloon will be inflated to make the artery wider.  The stent will be expanded and will crush the plaques into the wall of the vessel. The stent will hold the area open and improve the blood flow. Most stents have a drug coating to reduce the risk of the stent narrowing over time.  The artery may be made wider using a drill, laser, or other tools to remove plaques.  When the blood flow is better, the catheter will be removed. The lining of the artery will grow over the stent, which stays where it was placed. This procedure may vary among health care providers and hospitals. What happens after the procedure?  If the procedure is done through the leg, you will be kept in bed lying flat for about 6 hours. You will be instructed to not bend and not cross your legs.  The insertion site will be checked frequently.  The pulse in your foot or wrist will be checked frequently.  You may have additional blood tests, X-rays, and a test that records the electrical activity of your heart (electrocardiogram, or ECG). This information is not intended to replace advice given to you by your health care provider. Make sure you discuss any questions you have with your health care provider. Document Released: 06/17/2003 Document Revised: 03/22/2018 Document Reviewed: 07/16/2016 Elsevier Patient Education  2020 [employer].

## 2019-11-20 LAB — IRON,TIBC AND FERRITIN PANEL
Ferritin: 89 ng/mL (ref 15–150)
Iron Saturation: 18 % (ref 15–55)
Iron: 58 ug/dL (ref 27–139)
Total Iron Binding Capacity: 321 ug/dL (ref 250–450)
UIBC: 263 ug/dL (ref 118–369)

## 2019-11-20 LAB — BASIC METABOLIC PANEL
BUN/Creatinine Ratio: 13 (ref 12–28)
BUN: 16 mg/dL (ref 8–27)
CO2: 24 mmol/L (ref 20–29)
Calcium: 9.2 mg/dL (ref 8.7–10.3)
Chloride: 98 mmol/L (ref 96–106)
Creatinine, Ser: 1.19 mg/dL — ABNORMAL HIGH (ref 0.57–1.00)
GFR calc Af Amer: 53 mL/min/{1.73_m2} — ABNORMAL LOW (ref >59)
GFR calc non Af Amer: 46 mL/min/{1.73_m2} — ABNORMAL LOW (ref >59)
Glucose: 96 mg/dL (ref 65–99)
Potassium: 4.7 mmol/L (ref 3.5–5.2)
Sodium: 135 mmol/L (ref 134–144)

## 2019-11-20 LAB — CBC
Hematocrit: 26.1 % — ABNORMAL LOW (ref 34.0–46.6)
Hemoglobin: 8.7 g/dL — ABNORMAL LOW (ref 11.1–15.9)
MCH: 30.4 pg (ref 26.6–33.0)
MCHC: 33.3 g/dL (ref 31.5–35.7)
MCV: 91 fL (ref 79–97)
Platelets: 177 10*3/uL (ref 150–450)
RBC: 2.86 x10E6/uL — ABNORMAL LOW (ref 3.77–5.28)
RDW: 14.2 % (ref 11.7–15.4)
WBC: 6.8 10*3/uL (ref 3.4–10.8)

## 2019-11-24 ENCOUNTER — Encounter: Payer: Self-pay | Admitting: *Deleted

## 2019-11-24 ENCOUNTER — Telehealth: Payer: Self-pay | Admitting: *Deleted

## 2019-11-24 DIAGNOSIS — D649 Anemia, unspecified: Secondary | ICD-10-CM

## 2019-11-24 NOTE — Addendum Note (Signed)
Addended by: Austin Miles on: 11/24/2019 02:06 PM   Modules accepted: Orders

## 2019-11-24 NOTE — Telephone Encounter (Signed)
-----   Message from Richardo Priest, MD sent at 11/23/2019 11:43 AM EST ----- Her anemia is worse hemoglobin is now less than 9 I took time on Sunday went back to her old records and I cannot find that she is ever had any evaluation for chronic anemia.  She is not iron deficient.  Prior to elective coronary angiography I would like you to be seen in consultation by hematology either at [facility] the med center [facility].  I would like her to cancel her planned coronary angiogram through 11/25/2019. ----- Message ----- From: Interface, [facility] Lab Results In Sent: 11/20/2019   9:37 AM EST To: Richardo Priest, MD

## 2019-11-24 NOTE — Telephone Encounter (Signed)
Patient returned call and advised of lab results. Patient is agreeable to cancelling cardiac catheterization at this time and seeing Dr. Bobby Rumpf with [facility] for further evaluation of anemia.   Called the cath lab and cancelled catheterization. Cancelled pre-procedural COVID testing.  Referral placed and faxed to Dr. Bobby Rumpf' office. Advised patient she will be contacted to schedule an appointment.   Patient is agreeable to plan and verbalized understanding. No further questions.

## 2019-11-24 NOTE — Telephone Encounter (Signed)
Left message to return call to discuss lab results

## 2019-11-25 ENCOUNTER — Other Ambulatory Visit (HOSPITAL_COMMUNITY): Payer: Medicare Other

## 2019-11-28 ENCOUNTER — Ambulatory Visit (HOSPITAL_COMMUNITY): Admission: RE | Admit: 2019-11-28 | Payer: Medicare Other | Source: Home / Self Care | Admitting: Cardiology

## 2019-11-28 ENCOUNTER — Encounter (HOSPITAL_COMMUNITY): Admission: RE | Payer: Self-pay | Source: Home / Self Care

## 2019-11-28 ENCOUNTER — Telehealth: Payer: Self-pay | Admitting: Cardiology

## 2019-11-28 SURGERY — LEFT HEART CATH AND CORONARY ANGIOGRAPHY
Anesthesia: LOCAL

## 2019-11-28 NOTE — Telephone Encounter (Signed)
Called patient who states she called Dr. Bobby Rumpf' office this morning and they reported that they had not received the referral or needed records to schedule her an appointment.   Called Dr. Bobby Rumpf' office and spoke with Pamala Hurry who reports she has received all the needed information via fax from our office and will contact patient to schedule an appointment as soon as possible.   Patient made aware. No further questions.

## 2019-11-28 NOTE — Telephone Encounter (Signed)
Says [facility] wants her to have an appt with Dr Bobby Rumpf and they need records

## 2019-12-02 DIAGNOSIS — D649 Anemia, unspecified: Secondary | ICD-10-CM | POA: Diagnosis not present

## 2019-12-09 DIAGNOSIS — D649 Anemia, unspecified: Secondary | ICD-10-CM | POA: Diagnosis not present

## 2019-12-10 ENCOUNTER — Telehealth: Payer: Self-pay | Admitting: Cardiology

## 2019-12-10 NOTE — Telephone Encounter (Signed)
The doctor [place] referred her to said [place] needs to check her BP every week

## 2019-12-11 NOTE — Telephone Encounter (Signed)
Returned patient's call who reports that she went to see Dr. Bobby Rumpf yesterday for further evaluation of anemia. Patient's BP during the visit was 205/100. Dr. Bobby Rumpf was very concerned regarding her elevated BP and advised that she "has already lost 70% of her kidney function due to her high blood pressure." Patient checked her BP twice daily at home yesterday and today and recorded the following readings:   Yesterday AM: BP 169/78 & HR 60  Yesterday PM: BP 176/83 & HR 54 Today AM: BP 180/90 & HR 64 Today PM: BP 192/85 & HR 66  Patient is taking all of her medications as prescribed. Please advise. Thanks!

## 2019-12-11 NOTE — Telephone Encounter (Signed)
Increase clonidine to BID

## 2019-12-12 NOTE — Telephone Encounter (Signed)
Patient advised to increase clonidine 0.1 mg from once daily to twice daily. Patient is agreeable. She will monitor her BP daily at the same time every day at least 1-2 hours after taking her medications and keep a log of these readings. Patient will bring her BP log to her follow up appointment on 12/31/2019 for Dr. Bettina Gavia to review. In the meantime, advised patient to contact our office if her BP readings remain consistently elevated. Patient verbalized understanding and is agreeable to plan. No further questions.

## 2019-12-17 ENCOUNTER — Telehealth: Payer: Self-pay | Admitting: Cardiology

## 2019-12-17 NOTE — Telephone Encounter (Signed)
Left message for patient to return call.

## 2019-12-17 NOTE — Telephone Encounter (Signed)
Has questions about her BP medicine

## 2019-12-18 NOTE — Telephone Encounter (Signed)
Called patient per Dr. Agustin Cree he advised patient decrease clonidine back to normal dose 0.1 mg once  a day, keep a log of blood pressures and let us know next week how she did with this. She was also advised to go to the emergency room over the weekend if things for worse and informed of our holiday closing. She verbally understood. No further questions.

## 2019-12-18 NOTE — Telephone Encounter (Signed)
Patient called back. She reports that about a week ago Dr. Bettina Gavia increased the patient's clonidine to 0.1 mg twice daily from once daily. Patient takes imdur 30 mg daily, bystolic 10 mg daily, and candesartan-hydrochlorothiazide 32 -12.5 mg daily. She also just had a "retucrit" shot a few days ago from her PCP to "build her blood back up".  For the past couple of days her blood pressure has been low and she feels dizzy like she is going to faint, her blood pressure yesterday was 80/40. Today her blood pressure is 106/58 and pulse 63 and she has not taken any medication yet. She wants to know what she should do. Will consult with DOD.

## 2019-12-24 ENCOUNTER — Other Ambulatory Visit: Payer: Self-pay | Admitting: Cardiology

## 2019-12-24 ENCOUNTER — Telehealth: Payer: Self-pay | Admitting: Cardiology

## 2019-12-24 ENCOUNTER — Other Ambulatory Visit: Payer: Self-pay | Admitting: *Deleted

## 2019-12-24 MED ORDER — CLONIDINE HCL 0.1 MG PO TABS: ORAL_TABLET | ORAL | 1 refills | Status: DC

## 2019-12-24 NOTE — Telephone Encounter (Signed)
Refill sent.

## 2019-12-24 NOTE — Telephone Encounter (Signed)
*  STAT* If patient is at the pharmacy, call can be transferred to refill team.   1. Which medications need to be refilled? (please list name of each medication and dose if known)  cloNIDine (CATAPRES) 0.1 MG tablet  2. Which pharmacy/location (including street and city if local pharmacy) is medication to be sent to? [id number] - [place], [place] - [address] AT [address]  3. Do they need a 30 day or 90 day supply? 90 with refills   Patient states at her last appointment the dosage of this medication was changed from 0.1 MG to 0.2. As a result she has run out of medication sooner. She only has 3 days left of medicine

## 2019-12-25 DIAGNOSIS — Z20828 Contact with and (suspected) exposure to other viral communicable diseases: Secondary | ICD-10-CM | POA: Diagnosis not present

## 2019-12-26 ENCOUNTER — Other Ambulatory Visit: Payer: Self-pay | Admitting: Cardiology

## 2019-12-26 DIAGNOSIS — I10 Essential (primary) hypertension: Secondary | ICD-10-CM

## 2019-12-30 NOTE — Progress Notes (Signed)
Cardiology Office Note:    Date:  12/31/2019   ID:  Angela Mccoy, DOB July 10, 1946, MRN [id number]  PCP:  Angela Hipps, MD  Cardiologist:  Angela More, MD    Referring MD: Angela Hipps, MD    ASSESSMENT:    1. Coronary artery disease involving native coronary artery of native heart with angina pectoris ([place])   2. Essential hypertension   3. Hyperlipidemia, unspecified hyperlipidemia type   4. Anemia, unspecified type    PLAN:    In order of problems listed above:  1. Stable CAD markedly improved with treatment of anemia at this time I would defer invasive cardiac procedures she will continue current medical treatment and reevaluate 3 months and continue her medical therapy with clopidogrel beta-blocker oral nitrate and lipid-lowering treatment.  She had a myocardial perfusion study 1 year ago with no ischemia 2. Stable hypertension with the addition of clonidine she is at target is at risk for renal artery stenosis undergo duplex and she has flow-limiting stenosis in my opinion would benefit from revascularization 3. Stable continue her statin 4. Impression she is under the care of hematology and will continue to receive treatment including erythropoietin 5. CKD I told her I think she should wait till after the peak of Covid to seek nephrology evaluation   Next appointment: Months   Medication Adjustments/Labs and Tests Ordered: Current medicines are reviewed at length with the patient today.  Concerns regarding medicines are outlined above.  Orders Placed This Encounter  Procedures  . VAS US RENAL ARTERY DUPLEX   No orders of the defined types were placed in this encounter.   Chief Complaint  Patient presents with  . Follow-up  . Coronary Artery Disease  . Anemia    History of Present Illness:     Angela Mccoy is a 74 y.o. female with a hx of CAD with PCI and drug-eluting stent to the LAD and right coronary artery in 2003, dyslipidemia  hypertension and chronic lower extremity edema. MPI at [facility] 10/08/18 was normal and 11/06/2019 with  poorly controlled hypertension and increasing frequency of angina..  He showed anemia with a hemoglobin of 9.0 and moderately elevated N-terminal BNP 566.  Preliminary event monitor report 3 days shows sinus rhythm minimum average maximum heart rates of 46, 60 and 99 bpm.  Ventricular and supraventricular arrhythmia was rare there is 1 4 beat run of APCs and there were frequent triggered and diary events generally unassociated with arrhythmia rarely with atrial premature contractions. After review of her iron studies she was referred to hematology evaluation [facility].  last seen 11/19/2019. Compliance with diet, lifestyle and medications: Yes   Ref Range & Units 1 mo ago  Total Iron Binding Capacity 250 - 450 ug/dL 321   UIBC 118 - 369 ug/dL 263   Iron 27 - 139 ug/dL 58   Iron Saturation 15 - 55 % 18   Ferritin 15 - 150 ng/mL 89     1 mo ago  (11/19/19) 1 mo ago  (11/06/19) 9 mo ago  (03/20/19)   WBC 3.4 - 10.8 x10E3/uL 6.8  6.3  7.8 R   RBC 3.77 - 5.28 x10E6/uL 2.86Low   2.93Low   3.45Low  R   Hemoglobin 11.1 - 15.9 g/dL 8.7Low   9.0Low   10.3Low  R   Hematocrit 34.0 - 46.6 % 26.1Low   25.3Low   29.7Low  R   MCV 79 - 97  fL 91  86  86.1    Study Highlights  ZIO monitor was used for 3 days to assess palpitation in the setting of coronary artery disease.  The study initiated 11/06/2019.  The rhythm throughout was sinus with minimum, average and maximum heart rates of 46, 60 and 99 bpm.  There were no pauses of 3 seconds or greater or episodes of AV nodal or sinus node block.  Ventricular ectopy was rare with isolated PVCs  Supraventricular ectopy was rare with isolated APCs.  There was one 6 beat run of ectopic atrial rhythm noted asymptomatic.  There were no episodes of atrial fibrillation or flutter.  There are 11 triggered events 1 associated with rare  atrial premature contraction.  There were 8 diary events of chest pain palpitation shortness of breath and lightheadedness 1 associated with rare atrial premature contraction   Conclusion unremarkable 3-day ZIO monitor triggered and diary events are generally unassociated with arrhythmia.   I reviewed the studies with the patient above.  She was seen by hematology was found to have multifactorial anemia with B12 folate deficiency iron deficiency and CKD she is on oral iron she has received erythropoietin and her hemoglobin is improved to 9.6 creatinine is 1.7 GFR 30 cc.  She feels remarkably improved and is having no angina dyspnea palpitation or syncope and especially with her CKD and the prevalence of COVID-19 in the community organ to defer invasive cardiac evaluation at this time with stable CAD.  She tells me she is scheduled for further visits and epoetin replacement I asked her to have the labs sent to me she talked about nephrology evaluation I told her I would wait till after the peak of COVID-19.  Home blood pressure runs consistently less than 825 systolic and clonidine has put her blood pressure in range she is at risk for renal artery stenosis will have a duplex performed. Past Medical History:  Diagnosis Date  . Anemia 07/31/2017  . Carotid bruit 02/26/2016  . Carotid bruit present 02/26/2016  . Coronary artery disease involving native coronary artery 02/26/2016  . Coronary artery disease involving native coronary artery of native heart with angina pectoris ([place]) 03/12/2016   Overview:  PCI and DES to LAD and RCA in 2003, cath 2010 wo restenosis  . Essential hypertension 03/12/2016  . High cholesterol 01/17/2017  . Hx of diverticulitis of colon 09/11/2016  . Hyperlipidemia 03/12/2016  . Hypertension 01/27/2016  . Insomnia 02/26/2016  . Migraine with aura 02/26/2016  . Mitral valve prolapse 02/26/2016  . Peripheral vascular disease ([place]) 02/26/2016  . Rheumatoid arthritis involving multiple joints  ([place]) 02/26/2016    Past Surgical History:  Procedure Laterality Date  . ABDOMINAL HYSTERECTOMY    . CHOLECYSTECTOMY      Current Medications: Current Meds  Medication Sig  . candesartan-hydrochlorothiazide (ATACAND HCT) 32-12.5 MG tablet Take 1 tablet by mouth daily.  . citalopram (CELEXA) 40 MG tablet TAKE 1 TABLET BY MOUTH EVERY DAY  . cloNIDine (CATAPRES) 0.1 MG tablet TAKE 1 TABLET(0.1 MG) BY MOUTH AT BEDTIME  . cloNIDine (CATAPRES) 0.1 MG tablet Take 0.1 mg by mouth daily. Pt takes half a tablet by mouth daily  . clopidogrel (PLAVIX) 75 MG tablet TAKE 1 TABLET BY MOUTH EVERY DAY  . Ferrous Sulfate (IRON) 325 (65 Fe) MG TABS Take 1 tablet (325 mg total) by mouth 2 (two) times daily.  . isosorbide mononitrate (IMDUR) 30 MG 24 hr tablet TAKE 1 TABLET(30 MG) BY MOUTH DAILY  .  Multiple Vitamins-Minerals (MULTIVITAMIN WITH MINERALS) tablet Take 1 tablet by mouth daily.  . nebivolol (BYSTOLIC) 10 MG tablet Take 1 tablet (10 mg total) by mouth daily.  . nitroGLYCERIN (NITROSTAT) 0.4 MG SL tablet ONE TABLET UNDER TONGUE AS NEEDED FOR CHEST PAIN EVERY 5 MINUTES  . pantoprazole (PROTONIX) 40 MG tablet Take 1 tablet (40 mg total) by mouth daily.  . pravastatin (PRAVACHOL) 40 MG tablet TAKE 1 TABLET BY MOUTH DAILY  . QUEtiapine (SEROQUEL) 200 MG tablet Take 200 mg by mouth daily.   . traMADol (ULTRAM) 50 MG tablet Take 50 mg by mouth every 6 (six) hours as needed (pain).   . vitamin E 400 UNIT capsule Take 400 Units by mouth daily.     Allergies:   Penicillins   Social History   Socioeconomic History  . Marital status: Married    Spouse name: Not on file  . Number of children: Not on file  . Years of education: Not on file  . Highest education level: Not on file  Occupational History  . Not on file  Tobacco Use  . Smoking status: Former Smoker    Packs/day: 1.00    Years: 18.00    Pack years: 18.00    Types: Cigarettes    Quit date: 12/26/1987    Years since quitting: 32.0  .  Smokeless tobacco: Never Used  Substance and Sexual Activity  . Alcohol use: No  . Drug use: Never  . Sexual activity: Not on file  Other Topics Concern  . Not on file  Social History Narrative  . Not on file   Social Determinants of Health   Financial Resource Strain:   . Difficulty of Paying Living Expenses: Not on file  Food Insecurity:   . Worried About Charity fundraiser in the Last Year: Not on file  . Ran Out of Food in the Last Year: Not on file  Transportation Needs:   . Lack of Transportation (Medical): Not on file  . Lack of Transportation (Non-Medical): Not on file  Physical Activity:   . Days of Exercise per Week: Not on file  . Minutes of Exercise per Session: Not on file  Stress:   . Feeling of Stress : Not on file  Social Connections:   . Frequency of Communication with Friends and Family: Not on file  . Frequency of Social Gatherings with Friends and Family: Not on file  . Attends Religious Services: Not on file  . Active Member of Clubs or Organizations: Not on file  . Attends Archivist Meetings: Not on file  . Marital Status: Not on file     Family History: The patient's family history includes Breast cancer in her sister; Heart attack in her brother, mother, and sister; Lung cancer in her brother. There is no history of Asthma. ROS:   Please see the history of present illness.    All other systems reviewed and are negative.  EKGs/Labs/Other Studies Reviewed:    The following studies were reviewed today: 11/06/2019 sinus rhythm normal 11/09/2018 showed a myocardial perfusion study at [facility] showing normal perfusion and function.  Recent Labs: 11/06/2019: ALT 12; NT-Pro BNP 566 11/19/2019: BUN 16; Creatinine, Ser 1.19; Hemoglobin 8.7; Platelets 177; Potassium 4.7; Sodium 135  Recent Lipid Panel    Component Value Date/Time   CHOL 137 11/06/2019 1122   TRIG 226 (H) 11/06/2019 1122   HDL 46 11/06/2019 1122   CHOLHDL 3.0  11/06/2019 1122   LDLCALC  55 11/06/2019 1122    Physical Exam:    VS:  BP (!) 146/70   Pulse 71   Ht 5\' 3"  (1.6 m)   Wt 149 lb (67.6 kg)   SpO2 95%   BMI 26.39 kg/m     Wt Readings from Last 3 Encounters:  12/31/19 149 lb (67.6 kg)  11/19/19 149 lb 6.4 oz (67.8 kg)  11/06/19 146 lb (66.2 kg)     GEN:  Well nourished, well developed in no acute distress appears markedly improved no longer has pallor apprehension HEENT: Normal NECK: No JVD; No carotid bruits LYMPHATICS: No lymphadenopathy CARDIAC: RRR, no murmurs, rubs, gallops RESPIRATORY:  Clear to auscultation without rales, wheezing or rhonchi  ABDOMEN: Soft, non-tender, non-distended MUSCULOSKELETAL:  No edema; No deformity  SKIN: Warm and dry NEUROLOGIC:  Alert and oriented x 3 PSYCHIATRIC:  Normal affect    Signed, Angela More, MD  12/31/2019 8:47 AM    [place]

## 2019-12-31 ENCOUNTER — Encounter: Payer: Self-pay | Admitting: Cardiology

## 2019-12-31 ENCOUNTER — Ambulatory Visit (INDEPENDENT_AMBULATORY_CARE_PROVIDER_SITE_OTHER): Payer: Medicare Other | Admitting: Cardiology

## 2019-12-31 ENCOUNTER — Other Ambulatory Visit: Payer: Self-pay

## 2019-12-31 VITALS — BP 146/70 | HR 71 | Ht 63.0 in | Wt 149.0 lb

## 2019-12-31 DIAGNOSIS — D649 Anemia, unspecified: Secondary | ICD-10-CM | POA: Diagnosis not present

## 2019-12-31 DIAGNOSIS — I25119 Atherosclerotic heart disease of native coronary artery with unspecified angina pectoris: Secondary | ICD-10-CM | POA: Diagnosis not present

## 2019-12-31 DIAGNOSIS — E785 Hyperlipidemia, unspecified: Secondary | ICD-10-CM | POA: Diagnosis not present

## 2019-12-31 DIAGNOSIS — I10 Essential (primary) hypertension: Secondary | ICD-10-CM | POA: Diagnosis not present

## 2019-12-31 MED ORDER — ISOSORBIDE MONONITRATE ER 30 MG PO TB24: ORAL_TABLET | ORAL | 3 refills | Status: DC

## 2019-12-31 MED ORDER — PRAVASTATIN SODIUM 40 MG PO TABS: 40.0000 mg | ORAL_TABLET | Freq: Every day | ORAL | 3 refills | Status: DC

## 2019-12-31 MED ORDER — CANDESARTAN CILEXETIL-HCTZ 32-12.5 MG PO TABS: 1.0000 | ORAL_TABLET | Freq: Every day | ORAL | 3 refills | Status: DC

## 2019-12-31 MED ORDER — CLOPIDOGREL BISULFATE 75 MG PO TABS: 75.0000 mg | ORAL_TABLET | Freq: Every day | ORAL | 3 refills | Status: DC

## 2019-12-31 MED ORDER — NEBIVOLOL HCL 10 MG PO TABS: 10.0000 mg | ORAL_TABLET | Freq: Every day | ORAL | 3 refills | Status: DC

## 2019-12-31 NOTE — Addendum Note (Signed)
Addended by: Gaetano Net on: 12/31/2019 09:08 AM   Modules accepted: Orders

## 2019-12-31 NOTE — Patient Instructions (Addendum)
  Medication Instructions:  Your physician recommends that you continue on your current medications as directed. Please refer to the Current Medication list given to you today.  *If you need a refill on your cardiac medications before your next appointment, please call your pharmacy*  Lab Work: None ordered  If you have labs (blood work) drawn today and your tests are completely normal, you will receive your results only by: Marland Kitchen MyChart Message (if you have MyChart) OR . A paper copy in the mail If you have any lab test that is abnormal or we need to change your treatment, we will call you to review the results.  Testing/Procedures:. Your physician has requested that you have a renal artery duplex. During this test, an ultrasound is used to evaluate blood flow to the kidneys. Allow one hour for this exam. Do not eat after midnight the day before and avoid carbonated beverages. Take your medications as you usually do.    Follow-Up: At [facility], you and your health needs are our priority.  As part of our continuing mission to provide you with exceptional heart care, we have created designated Provider Care Teams.  These Care Teams include your primary Cardiologist (physician) and Advanced Practice Providers (APPs -  Physician Assistants and Nurse Practitioners) who all work together to provide you with the care you need, when you need it.  Your next appointment:   3 month(s)  The format for your next appointment:   In Person  Provider:   [name], MD  Other Instructions  Purchase on line at [facility] or at [facility]

## 2020-01-02 ENCOUNTER — Other Ambulatory Visit: Payer: Self-pay

## 2020-01-02 ENCOUNTER — Telehealth: Payer: Self-pay | Admitting: Cardiology

## 2020-01-02 MED ORDER — CLONIDINE HCL 0.1 MG PO TABS: ORAL_TABLET | ORAL | 1 refills | Status: DC

## 2020-01-02 NOTE — Telephone Encounter (Signed)
Refilled rx for patient. Sent to [facility] in [place]

## 2020-01-02 NOTE — Telephone Encounter (Signed)
Call clonidine to [facility] in [place]

## 2020-01-09 DIAGNOSIS — N189 Chronic kidney disease, unspecified: Secondary | ICD-10-CM | POA: Diagnosis not present

## 2020-01-09 DIAGNOSIS — D631 Anemia in chronic kidney disease: Secondary | ICD-10-CM | POA: Diagnosis not present

## 2020-01-14 DIAGNOSIS — Z1211 Encounter for screening for malignant neoplasm of colon: Secondary | ICD-10-CM | POA: Diagnosis not present

## 2020-01-16 ENCOUNTER — Telehealth: Payer: Self-pay | Admitting: Cardiology

## 2020-01-16 NOTE — Telephone Encounter (Signed)
Pt calling stating that her medication clonidine 0.1 mg tablet was sent in wrong. According to phone note on 12/10/19, Dr. Bettina Gavia increased pt's medication clonidine 0.1 mg to BID, this was not sent in to pt's pharmacy. Pt states that she is out of medication. Please address

## 2020-01-16 NOTE — Telephone Encounter (Signed)
Hi Dr. Bettina Gavia,   Please see previous message. Can you please clarify how patient is suppose to take this medication?   Thanks

## 2020-01-16 NOTE — Telephone Encounter (Signed)
This again points to how important it is to reconcile meds.  The only way I know how the patient is taking medications is the give-and-take in the office and we asked him and we correct the medication list.  I would correct her clonidine to 0.1 g 1 p.o. twice daily give her 90 days 3 refills

## 2020-01-19 ENCOUNTER — Other Ambulatory Visit: Payer: Self-pay

## 2020-01-19 MED ORDER — CLONIDINE HCL 0.1 MG PO TABS: ORAL_TABLET | ORAL | 3 refills | Status: DC

## 2020-01-19 NOTE — Telephone Encounter (Signed)
Sent rx per Dr. Joya Gaskins recommendation.

## 2020-01-19 NOTE — Progress Notes (Signed)
Confirmed rx details with Dr. Bettina Gavia. Sent in new refill per his recommendation.

## 2020-02-06 DIAGNOSIS — C7989 Secondary malignant neoplasm of other specified sites: Secondary | ICD-10-CM | POA: Diagnosis not present

## 2020-02-06 DIAGNOSIS — C189 Malignant neoplasm of colon, unspecified: Secondary | ICD-10-CM | POA: Diagnosis not present

## 2020-02-06 DIAGNOSIS — C778 Secondary and unspecified malignant neoplasm of lymph nodes of multiple regions: Secondary | ICD-10-CM | POA: Diagnosis not present

## 2020-02-14 ENCOUNTER — Other Ambulatory Visit: Payer: Self-pay | Admitting: Cardiology

## 2020-02-19 ENCOUNTER — Ambulatory Visit (INDEPENDENT_AMBULATORY_CARE_PROVIDER_SITE_OTHER): Payer: Medicare Other

## 2020-02-19 ENCOUNTER — Other Ambulatory Visit: Payer: Self-pay

## 2020-02-19 DIAGNOSIS — I10 Essential (primary) hypertension: Secondary | ICD-10-CM

## 2020-02-19 NOTE — Progress Notes (Signed)
Renal artery duplex exam has been performed.   Jimmy Camera Krienke RDCS, RVT

## 2020-03-05 DIAGNOSIS — C7989 Secondary malignant neoplasm of other specified sites: Secondary | ICD-10-CM | POA: Diagnosis not present

## 2020-03-05 DIAGNOSIS — C778 Secondary and unspecified malignant neoplasm of lymph nodes of multiple regions: Secondary | ICD-10-CM | POA: Diagnosis not present

## 2020-03-05 DIAGNOSIS — C189 Malignant neoplasm of colon, unspecified: Secondary | ICD-10-CM | POA: Diagnosis not present

## 2020-03-08 DIAGNOSIS — C7989 Secondary malignant neoplasm of other specified sites: Secondary | ICD-10-CM | POA: Diagnosis not present

## 2020-03-08 DIAGNOSIS — D649 Anemia, unspecified: Secondary | ICD-10-CM | POA: Diagnosis not present

## 2020-03-08 DIAGNOSIS — C189 Malignant neoplasm of colon, unspecified: Secondary | ICD-10-CM | POA: Diagnosis not present

## 2020-03-08 DIAGNOSIS — Z5189 Encounter for other specified aftercare: Secondary | ICD-10-CM | POA: Diagnosis not present

## 2020-03-08 DIAGNOSIS — C778 Secondary and unspecified malignant neoplasm of lymph nodes of multiple regions: Secondary | ICD-10-CM | POA: Diagnosis not present

## 2020-03-09 DIAGNOSIS — D631 Anemia in chronic kidney disease: Secondary | ICD-10-CM

## 2020-03-09 DIAGNOSIS — N189 Chronic kidney disease, unspecified: Secondary | ICD-10-CM

## 2020-03-24 DIAGNOSIS — Z0000 Encounter for general adult medical examination without abnormal findings: Secondary | ICD-10-CM | POA: Diagnosis not present

## 2020-03-24 DIAGNOSIS — Z1331 Encounter for screening for depression: Secondary | ICD-10-CM | POA: Diagnosis not present

## 2020-03-24 DIAGNOSIS — Z6825 Body mass index (BMI) 25.0-25.9, adult: Secondary | ICD-10-CM | POA: Diagnosis not present

## 2020-03-24 DIAGNOSIS — F4321 Adjustment disorder with depressed mood: Secondary | ICD-10-CM | POA: Diagnosis not present

## 2020-03-24 DIAGNOSIS — Z8701 Personal history of pneumonia (recurrent): Secondary | ICD-10-CM | POA: Diagnosis not present

## 2020-03-29 NOTE — Progress Notes (Signed)
Cardiology Office Note:    Date:  03/30/2020   ID:  Angela Mccoy, DOB Mar 24, 1946, MRN [id number]  PCP:  Ronita Hipps, MD  Cardiologist:  [name], MD    Referring MD: Ronita Hipps, MD    ASSESSMENT:    1. Essential hypertension   2. Coronary artery disease involving native coronary artery of native heart with angina pectoris ([place])   3. Hyperlipidemia, unspecified hyperlipidemia type   4. Anemia, unspecified type    PLAN:    In order of problems listed above:  1. She is improved BP at target and continue her current medical regimen including centrally active clonidine beta-blocker and ARB diuretic.  12 labs drawn including renal profile potassium at the cancer center.   2. Stable CAD New York Heart Association class I no anginal discomfort and she will continue her current medical treatment including antiplatelet clopidogrel statin beta-blocker and oral nitrate.  At this time we neither of [place] feel she requires coronary angiography and revascularization 3. Continue her statin labs requested to be drawn at the cancer center 4. Followed and managed by hematology Dr. Bobby Rumpf   Next appointment: 6 months   Medication Adjustments/Labs and Tests Ordered: Current medicines are reviewed at length with the patient today.  Concerns regarding medicines are outlined above.  No orders of the defined types were placed in this encounter.  No orders of the defined types were placed in this encounter.   No chief complaint on file.   History of Present Illness:    Angela Mccoy is a 74 y.o. female with a hx of  CAD with PCI and drug-eluting stent to the LAD and right coronary artery in 2003, dyslipidemia hypertension deficiency anemia and chronic lower extremity edema. MPI at [facility] 10/08/18 was normal and 11/06/2019 with  poorly controlled hypertension and increasing frequency of angina.  Renovascular duplex performed 02/19/2020 showed no findings of renal artery  stenosis.  A renal vascular duplex showed no stenosis, there was  70% or greater stenosis of both the celiac and superior mesenteric arteries. She was last seen 12/31/2019. Compliance with diet, lifestyle and medications: Yes  She follows with Dr. Bobby Rumpf hematology tells me her hemoglobin was 9-1/2-10 and she is receiving erythropoietin.  She has markedly improved has no shortness of breath swelling chest pain palpitation or syncope and has not needed nitroglycerin.  Her blood pressure is now well controlled.  She has labs checked monthly and will ask her to have a lipid profile and CMP drawn at the cancer center.  An ocular check an EKG she has not needed nitroglycerin. Past Medical History:  Diagnosis Date  . Anemia 07/31/2017  . Carotid bruit 02/26/2016  . Carotid bruit present 02/26/2016  . Cavitary pneumonia 03/20/2019   Onset of symptoms around late  Feb 2020 > all symptoms resolved p 5 days Meropenem and rx x 2 weeks then changed to cleocin -  D/c cleocin 03/20/2019  With esr 12, wbc 7,800  -  Quant TB 03/20/2019  Neg  - 05/14/2019 cxr with minimal streaky residual, no as dz   . Coronary artery disease involving native coronary artery 02/26/2016  . Coronary artery disease involving native coronary artery of native heart with angina pectoris ([place]) 03/12/2016   Overview:  PCI and DES to LAD and RCA in 2003, cath 2010 wo restenosis  . Essential hypertension 03/12/2016  . High cholesterol 01/17/2017  . Hx of diverticulitis of colon 09/11/2016  . Hyperlipidemia 03/12/2016  .  Hypertension 01/27/2016  . Insomnia 02/26/2016  . Migraine with aura 02/26/2016  . Mitral valve prolapse 02/26/2016  . Peripheral vascular disease ([place]) 02/26/2016  . Persistent atrial fibrillation ([place]) 11/06/2019  . Pneumonia of right upper lobe due to infectious organism 02/26/2019  . Rheumatoid arthritis involving multiple joints ([place]) 02/26/2016  . Rheumatoid arthritis involving multiple sites [facility]) 02/26/2016    Past Surgical History:    Procedure Laterality Date  . ABDOMINAL HYSTERECTOMY    . CHOLECYSTECTOMY      Current Medications: Current Meds  Medication Sig  . candesartan-hydrochlorothiazide (ATACAND HCT) 32-12.5 MG tablet Take 1 tablet by mouth daily.  . citalopram (CELEXA) 40 MG tablet TAKE 1 TABLET BY MOUTH EVERY DAY  . cloNIDine (CATAPRES) 0.1 MG tablet Take 0.1 mg by mouth daily. Pt takes half a tablet by mouth daily  . cloNIDine (CATAPRES) 0.1 MG tablet Take 0.1 mg by mouth at bedtime.  . clopidogrel (PLAVIX) 75 MG tablet Take 1 tablet (75 mg total) by mouth daily.  . Ferrous Sulfate (IRON) 325 (65 Fe) MG TABS Take 1 tablet (325 mg total) by mouth 2 (two) times daily.  . isosorbide mononitrate (IMDUR) 30 MG 24 hr tablet TAKE 1 TABLET(30 MG) BY MOUTH DAILY  . LORazepam (ATIVAN) 0.5 MG tablet Take 0.5 mg by mouth 2 (two) times daily.  . Multiple Vitamins-Minerals (MULTIVITAMIN WITH MINERALS) tablet Take 1 tablet by mouth daily.  . nebivolol (BYSTOLIC) 10 MG tablet Take 1 tablet (10 mg total) by mouth daily.  . nitroGLYCERIN (NITROSTAT) 0.4 MG SL tablet ONE TABLET UNDER TONGUE AS NEEDED FOR CHEST PAIN EVERY 5 MINUTES  . pantoprazole (PROTONIX) 40 MG tablet TAKE 1 TABLET(40 MG) BY MOUTH DAILY  . pravastatin (PRAVACHOL) 40 MG tablet Take 1 tablet (40 mg total) by mouth daily.  . propranolol (INDERAL) 10 MG tablet Take 10 mg by mouth every morning.  Marland Kitchen QUEtiapine (SEROQUEL) 200 MG tablet Take 200 mg by mouth daily.   . traMADol (ULTRAM) 50 MG tablet Take 50 mg by mouth every 6 (six) hours as needed (pain).   . vitamin E 400 UNIT capsule Take 400 Units by mouth daily.     Allergies:   Penicillins   Social History   Socioeconomic History  . Marital status: Married    Spouse name: Not on file  . Number of children: Not on file  . Years of education: Not on file  . Highest education level: Not on file  Occupational History  . Not on file  Tobacco Use  . Smoking status: Former Smoker    Packs/day: 1.00     Years: 18.00    Pack years: 18.00    Types: Cigarettes    Quit date: 12/26/1987    Years since quitting: 32.2  . Smokeless tobacco: Never Used  Substance and Sexual Activity  . Alcohol use: No  . Drug use: Never  . Sexual activity: Not on file  Other Topics Concern  . Not on file  Social History Narrative  . Not on file   Social Determinants of Health   Financial Resource Strain:   . Difficulty of Paying Living Expenses:   Food Insecurity:   . Worried About Charity fundraiser in the Last Year:   . Arboriculturist in the Last Year:   Transportation Needs:   . Film/video editor (Medical):   Marland Kitchen Lack of Transportation (Non-Medical):   Physical Activity:   . Days of Exercise per Week:   .  Minutes of Exercise per Session:   Stress:   . Feeling of Stress :   Social Connections:   . Frequency of Communication with Friends and Family:   . Frequency of Social Gatherings with Friends and Family:   . Attends Religious Services:   . Active Member of Clubs or Organizations:   . Attends Archivist Meetings:   Marland Kitchen Marital Status:      Family History: The patient's family history includes Breast cancer in her sister; Heart attack in her brother, mother, and sister; Lung cancer in her brother. There is no history of Asthma. ROS:   Please see the history of present illness.    All other systems reviewed and are negative.  EKGs/Labs/Other Studies Reviewed:    The following studies were reviewed today:    Recent Labs: 11/06/2019: ALT 12; NT-Pro BNP 566 11/19/2019: BUN 16; Creatinine, Ser 1.19; Hemoglobin 8.7; Platelets 177; Potassium 4.7; Sodium 135  Recent Lipid Panel    Component Value Date/Time   CHOL 137 11/06/2019 1122   TRIG 226 (H) 11/06/2019 1122   HDL 46 11/06/2019 1122   CHOLHDL 3.0 11/06/2019 1122   LDLCALC 55 11/06/2019 1122    Physical Exam:    VS:  BP 136/72   Pulse 60   Temp (!) 97 F (36.1 C)   Ht 5' 3"  (1.6 m)   Wt 145 lb 3.2 oz (65.9 kg)    SpO2 95%   BMI 25.72 kg/m     Wt Readings from Last 3 Encounters:  03/30/20 145 lb 3.2 oz (65.9 kg)  12/31/19 149 lb (67.6 kg)  11/19/19 149 lb 6.4 oz (67.8 kg)     GEN:  Well nourished, well developed in no acute distress HEENT: Normal NECK: No JVD; No carotid bruits LYMPHATICS: No lymphadenopathy CARDIAC: RRR, no murmurs, rubs, gallops RESPIRATORY:  Clear to auscultation without rales, wheezing or rhonchi  ABDOMEN: Soft, non-tender, non-distended MUSCULOSKELETAL:  No edema; No deformity  SKIN: Warm and dry NEUROLOGIC:  Alert and oriented x 3 PSYCHIATRIC:  Normal affect    Signed, [name], MD  03/30/2020 8:33 AM    [place]

## 2020-03-30 ENCOUNTER — Telehealth: Payer: Self-pay

## 2020-03-30 ENCOUNTER — Other Ambulatory Visit: Payer: Self-pay

## 2020-03-30 ENCOUNTER — Encounter: Payer: Self-pay | Admitting: Cardiology

## 2020-03-30 ENCOUNTER — Ambulatory Visit (INDEPENDENT_AMBULATORY_CARE_PROVIDER_SITE_OTHER): Payer: Medicare Other | Admitting: Cardiology

## 2020-03-30 VITALS — BP 136/72 | HR 60 | Temp 97.0°F | Ht 63.0 in | Wt 145.2 lb

## 2020-03-30 DIAGNOSIS — I25119 Atherosclerotic heart disease of native coronary artery with unspecified angina pectoris: Secondary | ICD-10-CM

## 2020-03-30 DIAGNOSIS — D649 Anemia, unspecified: Secondary | ICD-10-CM

## 2020-03-30 DIAGNOSIS — I10 Essential (primary) hypertension: Secondary | ICD-10-CM | POA: Diagnosis not present

## 2020-03-30 DIAGNOSIS — E785 Hyperlipidemia, unspecified: Secondary | ICD-10-CM

## 2020-03-30 MED ORDER — METOPROLOL SUCCINATE ER 50 MG PO TB24: 50.0000 mg | ORAL_TABLET | Freq: Every day | ORAL | 3 refills | Status: DC

## 2020-03-30 NOTE — Patient Instructions (Signed)
Medication Instructions:  Your physician recommends that you continue on your current medications as directed. Please refer to the Current Medication list given to you today.  *If you need a refill on your cardiac medications before your next appointment, please call your pharmacy*   Lab Work: None If you have labs (blood work) drawn today and your tests are completely normal, you will receive your results only by: Marland Kitchen MyChart Message (if you have MyChart) OR . A paper copy in the mail If you have any lab test that is abnormal or we need to change your treatment, we will call you to review the results.   Testing/Procedures: None   Follow-Up: At [facility], you and your health needs are our priority.  As part of our continuing mission to provide you with exceptional heart care, we have created designated Provider Care Teams.  These Care Teams include your primary Cardiologist (physician) and Advanced Practice Providers (APPs -  Physician Assistants and Nurse Practitioners) who all work together to provide you with the care you need, when you need it.  We recommend signing up for the patient portal called "MyChart".  Sign up information is provided on this After Visit Summary.  MyChart is used to connect with patients for Virtual Visits (Telemedicine).  Patients are able to view lab/test results, encounter notes, upcoming appointments, etc.  Non-urgent messages can be sent to your provider as well.   To learn more about what you can do with MyChart, go to [email or web address].    Your next appointment:   6 month(s)  The format for your next appointment:   In Person  Provider:   [name], MD   Other Instructions Please have them draw a CMP and a Lipid profile at your next [facility] Hematology visit.

## 2020-03-30 NOTE — Telephone Encounter (Signed)
Left message on patients voicemail letting her know that we have switched her medication from Bystolic to Toprol 50 mg once daily. This was discussed with the patient in the office since she stated that she "Did not feel well" when taking the Bystolic.    Encouraged patient to call back with any questions or concerns.

## 2020-03-30 NOTE — Addendum Note (Signed)
Addended by: Resa Miner I on: 03/30/2020 09:06 AM   Modules accepted: Orders

## 2020-04-09 DIAGNOSIS — D631 Anemia in chronic kidney disease: Secondary | ICD-10-CM | POA: Diagnosis not present

## 2020-04-09 DIAGNOSIS — N189 Chronic kidney disease, unspecified: Secondary | ICD-10-CM | POA: Diagnosis not present

## 2020-05-10 DIAGNOSIS — N189 Chronic kidney disease, unspecified: Secondary | ICD-10-CM | POA: Diagnosis not present

## 2020-05-10 DIAGNOSIS — D631 Anemia in chronic kidney disease: Secondary | ICD-10-CM | POA: Diagnosis not present

## 2020-05-16 ENCOUNTER — Other Ambulatory Visit: Payer: Self-pay | Admitting: Cardiology

## 2020-06-10 DIAGNOSIS — N189 Chronic kidney disease, unspecified: Secondary | ICD-10-CM

## 2020-06-10 DIAGNOSIS — D631 Anemia in chronic kidney disease: Secondary | ICD-10-CM

## 2020-07-06 DIAGNOSIS — Z1231 Encounter for screening mammogram for malignant neoplasm of breast: Secondary | ICD-10-CM | POA: Diagnosis not present

## 2020-07-20 DIAGNOSIS — I10 Essential (primary) hypertension: Secondary | ICD-10-CM | POA: Diagnosis not present

## 2020-07-20 DIAGNOSIS — Z6826 Body mass index (BMI) 26.0-26.9, adult: Secondary | ICD-10-CM | POA: Diagnosis not present

## 2020-07-20 DIAGNOSIS — A689 Relapsing fever, unspecified: Secondary | ICD-10-CM | POA: Diagnosis not present

## 2020-07-26 ENCOUNTER — Other Ambulatory Visit: Payer: Self-pay | Admitting: Cardiology

## 2020-08-10 DIAGNOSIS — N189 Chronic kidney disease, unspecified: Secondary | ICD-10-CM | POA: Diagnosis not present

## 2020-08-10 DIAGNOSIS — D631 Anemia in chronic kidney disease: Secondary | ICD-10-CM | POA: Diagnosis not present

## 2020-08-11 DIAGNOSIS — H2703 Aphakia, bilateral: Secondary | ICD-10-CM | POA: Diagnosis not present

## 2020-08-19 DIAGNOSIS — J00 Acute nasopharyngitis [common cold]: Secondary | ICD-10-CM | POA: Diagnosis not present

## 2020-08-19 DIAGNOSIS — Z20828 Contact with and (suspected) exposure to other viral communicable diseases: Secondary | ICD-10-CM | POA: Diagnosis not present

## 2020-10-03 ENCOUNTER — Encounter: Payer: Self-pay | Admitting: Pharmacist

## 2020-10-03 DIAGNOSIS — N183 Chronic kidney disease, stage 3 unspecified: Secondary | ICD-10-CM

## 2020-10-03 DIAGNOSIS — N189 Chronic kidney disease, unspecified: Secondary | ICD-10-CM | POA: Insufficient documentation

## 2020-10-03 DIAGNOSIS — D631 Anemia in chronic kidney disease: Secondary | ICD-10-CM

## 2020-10-03 HISTORY — DX: Chronic kidney disease, unspecified: N18.9

## 2020-10-03 HISTORY — DX: Anemia in chronic kidney disease: D63.1

## 2020-10-06 NOTE — Progress Notes (Signed)
Cardiology Office Note:    Date:  10/07/2020   ID:  Angela Mccoy, DOB 09/18/1946, MRN [id number]  PCP:  Ronita Hipps, MD  Cardiologist:  [name], MD    Referring MD: Ronita Hipps, MD    ASSESSMENT:    1. Coronary artery disease involving native coronary artery of native heart with angina pectoris ([place])   2. Essential hypertension   3. Hyperlipidemia, unspecified hyperlipidemia type   4. Iron deficiency anemia, unspecified iron deficiency anemia type    PLAN:    In order of problems listed above:  1. She is much improved after treatment of her iron deficiency anemia and adequate hemoglobin.  Having no angina we will continue medical therapy including long-term clopidogrel beta-blocker Imdur and statin.  At this time I do not think she requires coronary angiography. 2. BP at target continue current treatment and follow home blood pressures. 3. Stable continue her statin and check lipid profile given an order today along with a CMP 4. Improved managed by hematology hemoglobin now greater than 10   Next appointment: 6 months   Medication Adjustments/Labs and Tests Ordered: Current medicines are reviewed at length with the patient today.  Concerns regarding medicines are outlined above.  No orders of the defined types were placed in this encounter.  No orders of the defined types were placed in this encounter.   Chief Complaint  Patient presents with  . Follow-up  . Coronary Artery Disease    History of Present Illness:    Angela Mccoy is a 74 y.o. female with a hx of CAD with PCI and drug-eluting stent to the LAD and right coronary artery in 2003, dyslipidemia hypertension deficiency anemia and chronic lower extremity edema  last seen 03/30/2020. Compliance with diet, lifestyle and medications: Yes  She is doing much better she is reduce stress at work no longer is and we position to improve the quality of her life.  She has been seen  by hematology iron deficient has received parenteral as well as transfusion feels much better her hemoglobin is now greater than 10.  Associated with that she has had no chest pain shortness of breath palpitation or syncope.  Home blood pressure runs consistently less than 140/70.  She is overdue for labs including CMP and lipids and I have ordered them to be drawn at the cancer center when she goes for hematology follow-up next week. Past Medical History:  Diagnosis Date  . Anemia 07/31/2017  . Carotid bruit 02/26/2016  . Carotid bruit present 02/26/2016  . Cavitary pneumonia 03/20/2019   Onset of symptoms around late  Feb 2020 > all symptoms resolved p 5 days Meropenem and rx x 2 weeks then changed to cleocin -  D/c cleocin 03/20/2019  With esr 12, wbc 7,800  -  Quant TB 03/20/2019  Neg  - 05/14/2019 cxr with minimal streaky residual, no as dz   . Coronary artery disease involving native coronary artery 02/26/2016  . Coronary artery disease involving native coronary artery of native heart with angina pectoris ([place]) 03/12/2016   Overview:  PCI and DES to LAD and RCA in 2003, cath 2010 wo restenosis  . Essential hypertension 03/12/2016  . High cholesterol 01/17/2017  . Hx of diverticulitis of colon 09/11/2016  . Hyperlipidemia 03/12/2016  . Hypertension 01/27/2016  . Insomnia 02/26/2016  . Migraine with aura 02/26/2016  . Mitral valve prolapse 02/26/2016  . Peripheral vascular disease ([place]) 02/26/2016  . Persistent atrial  fibrillation ([place]) 11/06/2019  . Pneumonia of right upper lobe due to infectious organism 02/26/2019  . Rheumatoid arthritis involving multiple joints ([place]) 02/26/2016  . Rheumatoid arthritis involving multiple sites [facility]) 02/26/2016    Past Surgical History:  Procedure Laterality Date  . ABDOMINAL HYSTERECTOMY    . CHOLECYSTECTOMY      Current Medications: Current Meds  Medication Sig  . candesartan-hydrochlorothiazide (ATACAND HCT) 32-12.5 MG tablet Take 1 tablet by mouth daily.  . citalopram  (CELEXA) 40 MG tablet TAKE 1 TABLET BY MOUTH EVERY DAY  . cloNIDine (CATAPRES) 0.1 MG tablet Take 0.1 mg by mouth daily. Pt takes half a tablet by mouth daily  . cloNIDine (CATAPRES) 0.1 MG tablet Take 0.1 mg by mouth at bedtime.  . clopidogrel (PLAVIX) 75 MG tablet Take 1 tablet (75 mg total) by mouth daily.  . Ferrous Sulfate (IRON) 325 (65 Fe) MG TABS Take 1 tablet (325 mg total) by mouth 2 (two) times daily.  . isosorbide mononitrate (IMDUR) 30 MG 24 hr tablet TAKE 1 TABLET(30 MG) BY MOUTH DAILY  . metoprolol succinate (TOPROL-XL) 50 MG 24 hr tablet Take 50 mg by mouth daily. Take 0.5 tablet twice a day  . Multiple Vitamins-Minerals (MULTIVITAMIN WITH MINERALS) tablet Take 1 tablet by mouth daily.  . nitroGLYCERIN (NITROSTAT) 0.4 MG SL tablet ONE TABLET UNDER TONGUE AS NEEDED FOR CHEST PAIN EVERY 5 MINUTES  . pantoprazole (PROTONIX) 40 MG tablet TAKE 1 TABLET(40 MG) BY MOUTH DAILY  . pravastatin (PRAVACHOL) 40 MG tablet Take 1 tablet (40 mg total) by mouth daily.  . QUEtiapine (SEROQUEL) 200 MG tablet Take 200 mg by mouth daily.   . traMADol (ULTRAM) 50 MG tablet Take 50 mg by mouth every 6 (six) hours as needed (pain).   . vitamin E 400 UNIT capsule Take 400 Units by mouth daily.     Allergies:   Penicillins   Social History   Socioeconomic History  . Marital status: Married    Spouse name: Not on file  . Number of children: Not on file  . Years of education: Not on file  . Highest education level: Not on file  Occupational History  . Not on file  Tobacco Use  . Smoking status: Former Smoker    Packs/day: 1.00    Years: 18.00    Pack years: 18.00    Types: Cigarettes    Quit date: 12/26/1987    Years since quitting: 32.8  . Smokeless tobacco: Never Used  Vaping Use  . Vaping Use: Never used  Substance and Sexual Activity  . Alcohol use: No  . Drug use: Never  . Sexual activity: Not on file  Other Topics Concern  . Not on file  Social History Narrative  . Not on file     Social Determinants of Health   Financial Resource Strain:   . Difficulty of Paying Living Expenses: Not on file  Food Insecurity:   . Worried About Charity fundraiser in the Last Year: Not on file  . Ran Out of Food in the Last Year: Not on file  Transportation Needs:   . Lack of Transportation (Medical): Not on file  . Lack of Transportation (Non-Medical): Not on file  Physical Activity:   . Days of Exercise per Week: Not on file  . Minutes of Exercise per Session: Not on file  Stress:   . Feeling of Stress : Not on file  Social Connections:   . Frequency of Communication with Friends  and Family: Not on file  . Frequency of Social Gatherings with Friends and Family: Not on file  . Attends Religious Services: Not on file  . Active Member of Clubs or Organizations: Not on file  . Attends Archivist Meetings: Not on file  . Marital Status: Not on file     Family History: The patient's family history includes Breast cancer in her sister; Heart attack in her brother, mother, and sister; Lung cancer in her brother. There is no history of Asthma. ROS:   Please see the history of present illness.    All other systems reviewed and are negative.  EKGs/Labs/Other Studies Reviewed:    The following studies were reviewed today:   Recent Labs: 11/06/2019: ALT 12; NT-Pro BNP 566 11/19/2019: BUN 16; Creatinine, Ser 1.19; Hemoglobin 8.7; Platelets 177; Potassium 4.7; Sodium 135  Recent Lipid Panel    Component Value Date/Time   CHOL 137 11/06/2019 1122   TRIG 226 (H) 11/06/2019 1122   HDL 46 11/06/2019 1122   CHOLHDL 3.0 11/06/2019 1122   LDLCALC 55 11/06/2019 1122    Physical Exam:    VS:  BP (!) 147/70   Pulse 62   Ht 5' 3"  (1.6 m)   Wt 144 lb 12.8 oz (65.7 kg)   SpO2 95%   BMI 25.65 kg/m     Wt Readings from Last 3 Encounters:  10/07/20 144 lb 12.8 oz (65.7 kg)  03/30/20 145 lb 3.2 oz (65.9 kg)  12/31/19 149 lb (67.6 kg)     GEN:  Well nourished,  well developed in no acute distress HEENT: Normal NECK: No JVD; No carotid bruits LYMPHATICS: No lymphadenopathy CARDIAC: RRR, no murmurs, rubs, gallops RESPIRATORY:  Clear to auscultation without rales, wheezing or rhonchi  ABDOMEN: Soft, non-tender, non-distended MUSCULOSKELETAL:  No edema; No deformity  SKIN: Warm and dry she no longer has pallor NEUROLOGIC:  Alert and oriented x 3 PSYCHIATRIC:  Normal affect    Signed, [name], MD  10/07/2020 3:52 PM    [facility]

## 2020-10-07 ENCOUNTER — Ambulatory Visit (INDEPENDENT_AMBULATORY_CARE_PROVIDER_SITE_OTHER): Payer: Medicare Other | Admitting: Cardiology

## 2020-10-07 ENCOUNTER — Encounter: Payer: Self-pay | Admitting: Cardiology

## 2020-10-07 ENCOUNTER — Other Ambulatory Visit: Payer: Self-pay

## 2020-10-07 VITALS — BP 147/70 | HR 62 | Ht 63.0 in | Wt 144.8 lb

## 2020-10-07 DIAGNOSIS — I10 Essential (primary) hypertension: Secondary | ICD-10-CM | POA: Diagnosis not present

## 2020-10-07 DIAGNOSIS — E785 Hyperlipidemia, unspecified: Secondary | ICD-10-CM

## 2020-10-07 DIAGNOSIS — D509 Iron deficiency anemia, unspecified: Secondary | ICD-10-CM

## 2020-10-07 DIAGNOSIS — I25119 Atherosclerotic heart disease of native coronary artery with unspecified angina pectoris: Secondary | ICD-10-CM

## 2020-10-07 DIAGNOSIS — Z23 Encounter for immunization: Secondary | ICD-10-CM | POA: Diagnosis not present

## 2020-10-07 NOTE — Patient Instructions (Signed)
Medication Instructions:  Your physician recommends that you continue on your current medications as directed. Please refer to the Current Medication list given to you today.  *If you need a refill on your cardiac medications before your next appointment, please call your pharmacy*   Lab Work: Your physician recommends that you return for lab work in: At [facility] during your next cancer treatment/appointment.  CMP, Lipids If you have labs (blood work) drawn today and your tests are completely normal, you will receive your results only by: Marland Kitchen MyChart Message (if you have MyChart) OR . A paper copy in the mail If you have any lab test that is abnormal or we need to change your treatment, we will call you to review the results.   Testing/Procedures: None   Follow-Up: At [facility], you and your health needs are our priority.  As part of our continuing mission to provide you with exceptional heart care, we have created designated Provider Care Teams.  These Care Teams include your primary Cardiologist (physician) and Advanced Practice Providers (APPs -  Physician Assistants and Nurse Practitioners) who all work together to provide you with the care you need, when you need it.  We recommend signing up for the patient portal called "MyChart".  Sign up information is provided on this After Visit Summary.  MyChart is used to connect with patients for Virtual Visits (Telemedicine).  Patients are able to view lab/test results, encounter notes, upcoming appointments, etc.  Non-urgent messages can be sent to your provider as well.   To learn more about what you can do with MyChart, go to [email or web address].    Your next appointment:   6 month(s)  The format for your next appointment:   In Person  Provider:   [name], MD   Other Instructions

## 2020-10-11 ENCOUNTER — Other Ambulatory Visit: Payer: Self-pay | Admitting: Hematology and Oncology

## 2020-10-11 ENCOUNTER — Other Ambulatory Visit: Payer: Self-pay | Admitting: Oncology

## 2020-10-11 DIAGNOSIS — D631 Anemia in chronic kidney disease: Secondary | ICD-10-CM | POA: Diagnosis not present

## 2020-10-11 DIAGNOSIS — E785 Hyperlipidemia, unspecified: Secondary | ICD-10-CM | POA: Diagnosis not present

## 2020-10-11 DIAGNOSIS — N189 Chronic kidney disease, unspecified: Secondary | ICD-10-CM | POA: Diagnosis not present

## 2020-10-11 LAB — CBC AND DIFFERENTIAL
HCT: 27 — AB (ref 36–46)
Hemoglobin: 8.9 — AB (ref 12.0–16.0)
Platelets: 184 (ref 150–399)
WBC: 5.8

## 2020-10-11 LAB — HEPATIC FUNCTION PANEL
ALT: 12 (ref 7–35)
AST: 22 (ref 13–35)
Alkaline Phosphatase: 89 (ref 25–125)
Bilirubin, Total: 0.4

## 2020-10-11 LAB — CBC: RBC: 3.03 — AB (ref 3.87–5.11)

## 2020-10-11 LAB — LIPID PANEL
Cholesterol: 135 (ref 0–200)
HDL: 33 — AB (ref 35–70)
LDL Cholesterol: 43
LDl/HDL Ratio: 4.1
Triglycerides: 294 — AB (ref 40–160)

## 2020-10-11 LAB — BASIC METABOLIC PANEL
BUN: 45 — AB (ref 4–21)
CO2: 28 — AB (ref 13–22)
Chloride: 98 — AB (ref 99–108)
Creatinine: 2.5 — AB (ref 0.5–1.1)
Glucose: 108
Potassium: 4.2 (ref 3.4–5.3)
Sodium: 135 — AB (ref 137–147)

## 2020-10-11 LAB — COMPREHENSIVE METABOLIC PANEL
Albumin: 4.1 (ref 3.5–5.0)
Calcium: 9.5 (ref 8.7–10.7)

## 2020-10-12 ENCOUNTER — Inpatient Hospital Stay: Payer: Medicare Other | Attending: Oncology

## 2020-10-12 ENCOUNTER — Other Ambulatory Visit: Payer: Self-pay

## 2020-10-12 VITALS — BP 128/59 | HR 64 | Temp 98.8°F | Resp 18 | Ht 63.0 in | Wt 148.5 lb

## 2020-10-12 DIAGNOSIS — N189 Chronic kidney disease, unspecified: Secondary | ICD-10-CM | POA: Insufficient documentation

## 2020-10-12 DIAGNOSIS — N183 Chronic kidney disease, stage 3 unspecified: Secondary | ICD-10-CM

## 2020-10-12 DIAGNOSIS — D631 Anemia in chronic kidney disease: Secondary | ICD-10-CM | POA: Insufficient documentation

## 2020-10-12 MED ORDER — EPOETIN ALFA-EPBX 40000 UNIT/ML IJ SOLN
40000.0000 [IU] | Freq: Once | INTRAMUSCULAR | Status: AC
  Administered 2020-10-12: 40000 [IU] via SUBCUTANEOUS

## 2020-10-12 MED ORDER — EPOETIN ALFA-EPBX 40000 UNIT/ML IJ SOLN
INTRAMUSCULAR | Status: AC
  Filled 2020-10-12: qty 1

## 2020-10-12 NOTE — Progress Notes (Signed)
Pt is stable at discharge. 

## 2020-10-12 NOTE — Patient Instructions (Signed)

## 2020-10-14 ENCOUNTER — Telehealth: Payer: Self-pay

## 2020-10-14 DIAGNOSIS — N184 Chronic kidney disease, stage 4 (severe): Secondary | ICD-10-CM

## 2020-10-14 NOTE — Telephone Encounter (Signed)
Per Dr. Bettina Gavia:  Re: Angela Mccoy 1946-05-19 her kidney function is most much worse than the labs I saw from [facility] 10/11/2020 she has stage IV CKD if she has not seen a nephrologist please refer to [facility].  Left message on patients voicemail to please return our call.

## 2020-10-14 NOTE — Addendum Note (Signed)
Addended by: Resa Miner I on: 10/14/2020 04:41 PM   Modules accepted: Orders

## 2020-10-14 NOTE — Telephone Encounter (Signed)
Spoke with patient regarding results and recommendation.  Patient verbalizes understanding and is agreeable to plan of care. Advised patient to call back with any issues or concerns.  

## 2020-10-18 ENCOUNTER — Telehealth: Payer: Self-pay

## 2020-10-18 ENCOUNTER — Other Ambulatory Visit: Payer: Self-pay | Admitting: Cardiology

## 2020-10-18 MED ORDER — CARVEDILOL 6.25 MG PO TABS
6.2500 mg | ORAL_TABLET | Freq: Two times a day (BID) | ORAL | 0 refills | Status: DC
Start: 2020-10-18 — End: 2020-10-18

## 2020-10-18 NOTE — Telephone Encounter (Signed)
Left message with husband per patient's DPR to return our call to go over her medications.  We received a fax from [facility] advising to hold the patient's candesartan- hydrochlorothiazide.  Per Dr. Harriet Masson patient will stop the candesartan-hydrochlorothiazide and the metoprolol.  And will start taking Carvedilol 6.25 mg BID.

## 2020-10-18 NOTE — Telephone Encounter (Signed)
Spoke with patient and she will stop taking the Metoprolol and Candesartan- Hydrochlorothiazide.  Informed her I will be sending in Carvedilol 6.25 mg twice a day to [facility].  Informed her the kidney specialists will be calling to schedule her appointment with them.

## 2020-10-28 ENCOUNTER — Other Ambulatory Visit: Payer: Self-pay | Admitting: Hematology and Oncology

## 2020-10-28 DIAGNOSIS — D631 Anemia in chronic kidney disease: Secondary | ICD-10-CM

## 2020-10-28 DIAGNOSIS — N183 Chronic kidney disease, stage 3 unspecified: Secondary | ICD-10-CM

## 2020-11-04 DIAGNOSIS — D509 Iron deficiency anemia, unspecified: Secondary | ICD-10-CM | POA: Diagnosis not present

## 2020-11-04 DIAGNOSIS — N1832 Chronic kidney disease, stage 3b: Secondary | ICD-10-CM | POA: Diagnosis not present

## 2020-11-04 DIAGNOSIS — I2510 Atherosclerotic heart disease of native coronary artery without angina pectoris: Secondary | ICD-10-CM | POA: Diagnosis not present

## 2020-11-04 DIAGNOSIS — D631 Anemia in chronic kidney disease: Secondary | ICD-10-CM | POA: Diagnosis not present

## 2020-11-04 DIAGNOSIS — E785 Hyperlipidemia, unspecified: Secondary | ICD-10-CM | POA: Diagnosis not present

## 2020-11-04 DIAGNOSIS — N179 Acute kidney failure, unspecified: Secondary | ICD-10-CM | POA: Diagnosis not present

## 2020-11-04 DIAGNOSIS — I129 Hypertensive chronic kidney disease with stage 1 through stage 4 chronic kidney disease, or unspecified chronic kidney disease: Secondary | ICD-10-CM | POA: Diagnosis not present

## 2020-11-04 DIAGNOSIS — I480 Paroxysmal atrial fibrillation: Secondary | ICD-10-CM | POA: Diagnosis not present

## 2020-11-04 DIAGNOSIS — R509 Fever, unspecified: Secondary | ICD-10-CM | POA: Diagnosis not present

## 2020-11-04 DIAGNOSIS — M069 Rheumatoid arthritis, unspecified: Secondary | ICD-10-CM | POA: Diagnosis not present

## 2020-11-04 DIAGNOSIS — N1831 Chronic kidney disease, stage 3a: Secondary | ICD-10-CM | POA: Diagnosis not present

## 2020-11-04 DIAGNOSIS — R7309 Other abnormal glucose: Secondary | ICD-10-CM | POA: Diagnosis not present

## 2020-11-04 DIAGNOSIS — I739 Peripheral vascular disease, unspecified: Secondary | ICD-10-CM | POA: Diagnosis not present

## 2020-11-09 DIAGNOSIS — N183 Chronic kidney disease, stage 3 unspecified: Secondary | ICD-10-CM | POA: Diagnosis not present

## 2020-11-09 DIAGNOSIS — N1831 Chronic kidney disease, stage 3a: Secondary | ICD-10-CM | POA: Diagnosis not present

## 2020-11-10 ENCOUNTER — Inpatient Hospital Stay: Payer: Medicare Other | Attending: Oncology

## 2020-11-10 ENCOUNTER — Other Ambulatory Visit: Payer: Self-pay

## 2020-11-10 DIAGNOSIS — D631 Anemia in chronic kidney disease: Secondary | ICD-10-CM | POA: Insufficient documentation

## 2020-11-10 DIAGNOSIS — N189 Chronic kidney disease, unspecified: Secondary | ICD-10-CM | POA: Diagnosis not present

## 2020-11-10 DIAGNOSIS — N183 Chronic kidney disease, stage 3 unspecified: Secondary | ICD-10-CM

## 2020-11-10 LAB — CBC (CANCER CENTER ONLY)
HCT: 31.2 % — ABNORMAL LOW (ref 36.0–46.0)
Hemoglobin: 10.1 g/dL — ABNORMAL LOW (ref 12.0–15.0)
MCH: 30.6 pg (ref 26.0–34.0)
MCHC: 32.4 g/dL (ref 30.0–36.0)
MCV: 94.5 fL (ref 80.0–100.0)
Platelet Count: 219 10*3/uL (ref 150–400)
RBC: 3.3 MIL/uL — ABNORMAL LOW (ref 3.87–5.11)
RDW: 14.3 % (ref 11.5–15.5)
WBC Count: 6.4 10*3/uL (ref 4.0–10.5)
nRBC: 0 % (ref 0.0–0.2)

## 2020-11-11 ENCOUNTER — Inpatient Hospital Stay: Payer: Medicare Other

## 2020-11-12 ENCOUNTER — Telehealth: Payer: Self-pay

## 2020-11-12 NOTE — Telephone Encounter (Signed)
Pt called to request that we fax her lab results from 11/10/2020 to Dr Joylene Grapes @ [facility].  Report printed & faxed as requested to [phone].

## 2020-11-23 DIAGNOSIS — I129 Hypertensive chronic kidney disease with stage 1 through stage 4 chronic kidney disease, or unspecified chronic kidney disease: Secondary | ICD-10-CM | POA: Diagnosis not present

## 2020-11-25 ENCOUNTER — Other Ambulatory Visit: Payer: Self-pay | Admitting: Hematology and Oncology

## 2020-11-25 DIAGNOSIS — N183 Chronic kidney disease, stage 3 unspecified: Secondary | ICD-10-CM

## 2020-11-25 DIAGNOSIS — D631 Anemia in chronic kidney disease: Secondary | ICD-10-CM

## 2020-12-02 NOTE — Progress Notes (Signed)
PT STABLE AT TIME OF DISCHARGE 

## 2020-12-07 DIAGNOSIS — J984 Other disorders of lung: Secondary | ICD-10-CM | POA: Diagnosis not present

## 2020-12-07 DIAGNOSIS — J189 Pneumonia, unspecified organism: Secondary | ICD-10-CM | POA: Diagnosis not present

## 2020-12-07 DIAGNOSIS — G4700 Insomnia, unspecified: Secondary | ICD-10-CM | POA: Diagnosis not present

## 2020-12-07 DIAGNOSIS — Z6825 Body mass index (BMI) 25.0-25.9, adult: Secondary | ICD-10-CM | POA: Diagnosis not present

## 2020-12-09 ENCOUNTER — Other Ambulatory Visit: Payer: Self-pay

## 2020-12-09 ENCOUNTER — Inpatient Hospital Stay: Payer: Medicare Other | Attending: Oncology

## 2020-12-09 ENCOUNTER — Other Ambulatory Visit: Payer: Self-pay | Admitting: Hematology and Oncology

## 2020-12-09 ENCOUNTER — Other Ambulatory Visit: Payer: Self-pay | Admitting: Oncology

## 2020-12-09 DIAGNOSIS — D649 Anemia, unspecified: Secondary | ICD-10-CM | POA: Diagnosis not present

## 2020-12-09 DIAGNOSIS — Z0001 Encounter for general adult medical examination with abnormal findings: Secondary | ICD-10-CM | POA: Diagnosis not present

## 2020-12-09 LAB — CBC
MCV: 88 — AB (ref 81–99)
RBC: 3.29 — AB (ref 3.87–5.11)

## 2020-12-09 LAB — CBC AND DIFFERENTIAL
HCT: 29 — AB (ref 36–46)
Hemoglobin: 10.2 — AB (ref 12.0–16.0)
Neutrophils Absolute: 3.63
Platelets: 183 (ref 150–399)
WBC: 6.6

## 2020-12-09 LAB — IRON,TIBC AND FERRITIN PANEL
%SAT: 21
Ferritin: 63.1
Iron: 63
TIBC: 300

## 2020-12-09 LAB — BASIC METABOLIC PANEL
BUN: 25 — AB (ref 4–21)
CO2: 34 — AB (ref 13–22)
Chloride: 91 — AB (ref 99–108)
Creatinine: 1 (ref 0.5–1.1)
Glucose: 95
Potassium: 3.2 — AB (ref 3.4–5.3)
Sodium: 135 — AB (ref 137–147)

## 2020-12-09 LAB — COMPREHENSIVE METABOLIC PANEL: Calcium: 9.3 (ref 8.7–10.7)

## 2020-12-10 ENCOUNTER — Inpatient Hospital Stay: Payer: Medicare Other

## 2020-12-10 ENCOUNTER — Telehealth: Payer: Self-pay | Admitting: Oncology

## 2020-12-10 NOTE — Telephone Encounter (Signed)
12/10/20 Spoke with patient and appt cancelled.

## 2020-12-10 NOTE — Progress Notes (Unsigned)
t-1

## 2020-12-13 DIAGNOSIS — R918 Other nonspecific abnormal finding of lung field: Secondary | ICD-10-CM | POA: Diagnosis not present

## 2020-12-13 DIAGNOSIS — Z8701 Personal history of pneumonia (recurrent): Secondary | ICD-10-CM | POA: Diagnosis not present

## 2020-12-13 DIAGNOSIS — I2510 Atherosclerotic heart disease of native coronary artery without angina pectoris: Secondary | ICD-10-CM | POA: Diagnosis not present

## 2020-12-13 DIAGNOSIS — J984 Other disorders of lung: Secondary | ICD-10-CM | POA: Diagnosis not present

## 2020-12-13 DIAGNOSIS — J189 Pneumonia, unspecified organism: Secondary | ICD-10-CM | POA: Diagnosis not present

## 2020-12-13 DIAGNOSIS — I700 Atherosclerosis of aorta: Secondary | ICD-10-CM | POA: Diagnosis not present

## 2020-12-14 NOTE — Telephone Encounter (Signed)
Next appt given to patient 

## 2020-12-23 DIAGNOSIS — I10 Essential (primary) hypertension: Secondary | ICD-10-CM | POA: Diagnosis not present

## 2020-12-23 DIAGNOSIS — G4700 Insomnia, unspecified: Secondary | ICD-10-CM | POA: Diagnosis not present

## 2020-12-23 DIAGNOSIS — E785 Hyperlipidemia, unspecified: Secondary | ICD-10-CM | POA: Diagnosis not present

## 2020-12-28 ENCOUNTER — Other Ambulatory Visit: Payer: Self-pay | Admitting: Cardiology

## 2021-01-08 IMAGING — DX CHEST - 2 VIEW
2 series · 2 of 2 positions shown · non-contrast
Comparison: CT chest from yesterday.

CLINICAL DATA: Pneumonia follow-up.

EXAM:
CHEST - 2 VIEW

[chest pa]
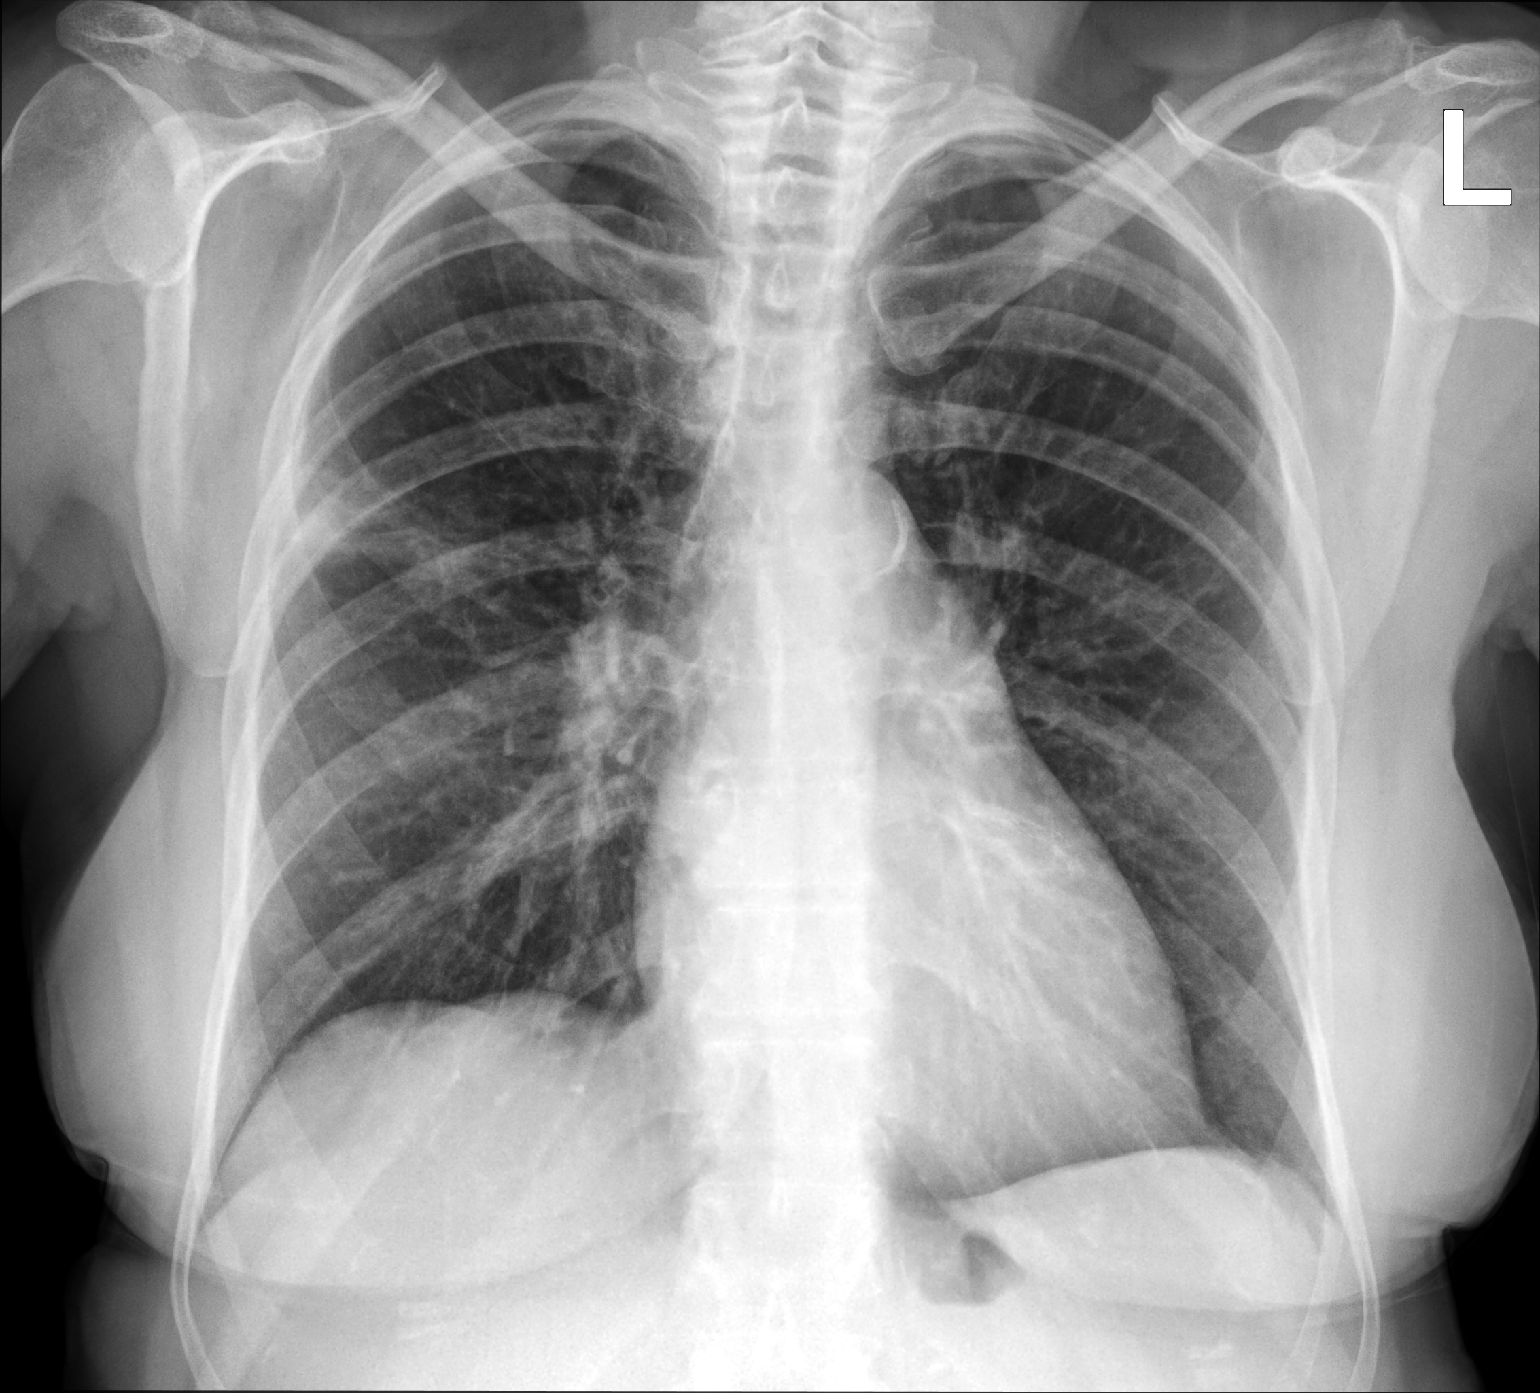

[chest lat]
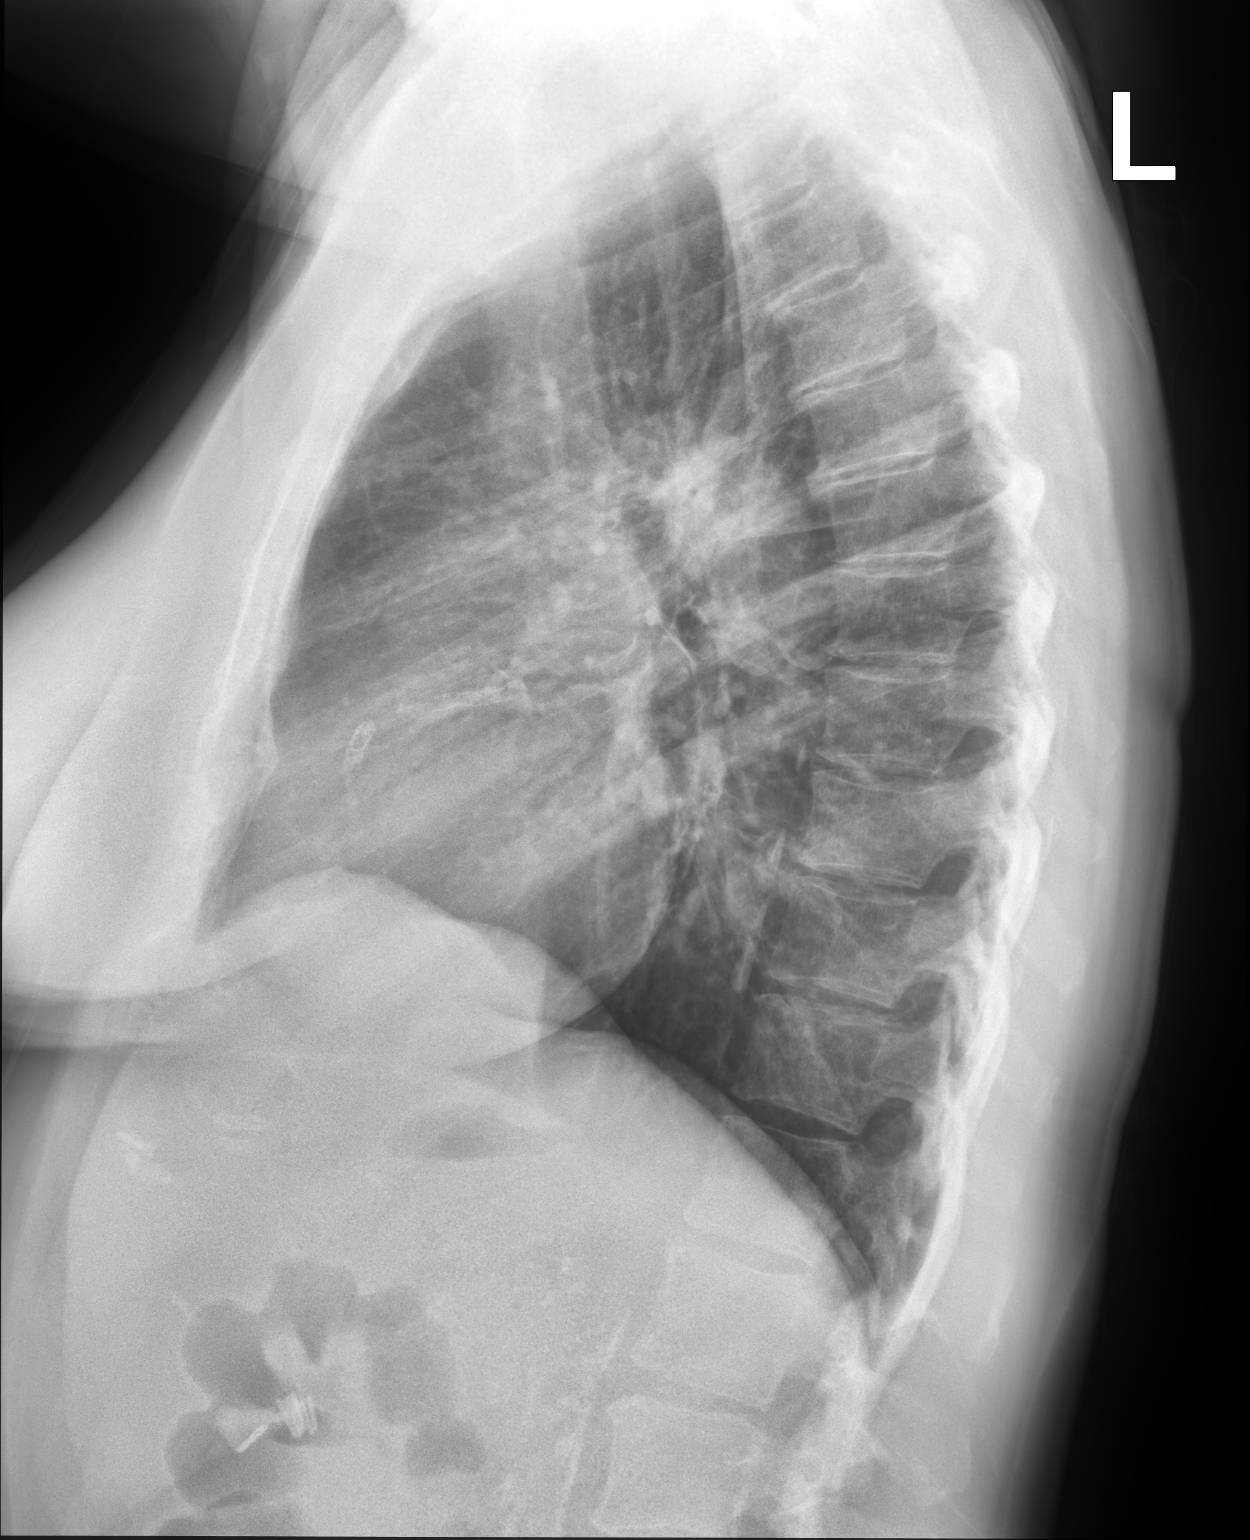

[2 of 2 positions shown; findings below may reference images not displayed]

FINDINGS: The heart remains at the upper limits of normal in size. Normal
mediastinal contours. Atherosclerotic calcification of the aortic
arch. Normal pulmonary vascularity. Unchanged irregular density in
the peripheral right upper lobe. Remaining lungs are clear. No
pleural effusion or pneumothorax. No acute osseous abnormality.
IMPRESSION: 1. Unchanged irregular density in the peripheral right upper lobe.
Although this could represent pneumonia in the appropriate clinical
setting, the somewhat spiculated appearance on yesterday's chest CT
is concerning for malignancy. Further evaluation with PET-CT or
followup PA and lateral chest X-ray in 3-4 weeks following trial of
antibiotic therapy is recommended.

## 2021-01-10 ENCOUNTER — Telehealth: Payer: Self-pay | Admitting: Oncology

## 2021-01-10 NOTE — Telephone Encounter (Signed)
1/17 spoke with patient and rs appts

## 2021-01-11 ENCOUNTER — Ambulatory Visit: Payer: Medicare Other | Admitting: Oncology

## 2021-01-11 ENCOUNTER — Inpatient Hospital Stay: Payer: Medicare Other

## 2021-01-12 ENCOUNTER — Other Ambulatory Visit: Payer: Self-pay | Admitting: Cardiology

## 2021-01-12 ENCOUNTER — Ambulatory Visit: Payer: Medicare Other

## 2021-01-12 NOTE — Telephone Encounter (Signed)
Refill sent to pharmacy.   

## 2021-01-13 ENCOUNTER — Other Ambulatory Visit: Payer: Self-pay | Admitting: Pharmacist

## 2021-01-18 ENCOUNTER — Inpatient Hospital Stay: Payer: Medicare Other | Attending: Oncology

## 2021-01-18 ENCOUNTER — Other Ambulatory Visit: Payer: Self-pay

## 2021-01-18 ENCOUNTER — Other Ambulatory Visit: Payer: Self-pay | Admitting: Hematology and Oncology

## 2021-01-18 DIAGNOSIS — D631 Anemia in chronic kidney disease: Secondary | ICD-10-CM | POA: Diagnosis not present

## 2021-01-18 DIAGNOSIS — D649 Anemia, unspecified: Secondary | ICD-10-CM | POA: Diagnosis not present

## 2021-01-18 LAB — BASIC METABOLIC PANEL
BUN: 21 (ref 4–21)
CO2: 28 — AB (ref 13–22)
Chloride: 92 — AB (ref 99–108)
Creatinine: 1 (ref 0.5–1.1)
Glucose: 160
Potassium: 3.7 (ref 3.4–5.3)
Sodium: 131 — AB (ref 137–147)

## 2021-01-18 LAB — COMPREHENSIVE METABOLIC PANEL: Calcium: 9.6 (ref 8.7–10.7)

## 2021-01-18 LAB — CBC AND DIFFERENTIAL
HCT: 32 — AB (ref 36–46)
Hemoglobin: 11.1 — AB (ref 12.0–16.0)
Neutrophils Absolute: 4.38
Platelets: 251 (ref 150–399)
WBC: 7.3

## 2021-01-18 LAB — CBC: RBC: 3.65 — AB (ref 3.87–5.11)

## 2021-01-19 ENCOUNTER — Inpatient Hospital Stay: Payer: Medicare Other

## 2021-01-24 ENCOUNTER — Encounter: Payer: Self-pay | Admitting: Oncology

## 2021-02-02 DIAGNOSIS — E785 Hyperlipidemia, unspecified: Secondary | ICD-10-CM | POA: Diagnosis not present

## 2021-02-02 DIAGNOSIS — M069 Rheumatoid arthritis, unspecified: Secondary | ICD-10-CM | POA: Diagnosis not present

## 2021-02-02 DIAGNOSIS — I129 Hypertensive chronic kidney disease with stage 1 through stage 4 chronic kidney disease, or unspecified chronic kidney disease: Secondary | ICD-10-CM | POA: Diagnosis not present

## 2021-02-02 DIAGNOSIS — N1831 Chronic kidney disease, stage 3a: Secondary | ICD-10-CM | POA: Diagnosis not present

## 2021-02-02 DIAGNOSIS — I480 Paroxysmal atrial fibrillation: Secondary | ICD-10-CM | POA: Diagnosis not present

## 2021-02-02 DIAGNOSIS — D509 Iron deficiency anemia, unspecified: Secondary | ICD-10-CM | POA: Diagnosis not present

## 2021-02-02 DIAGNOSIS — I2510 Atherosclerotic heart disease of native coronary artery without angina pectoris: Secondary | ICD-10-CM | POA: Diagnosis not present

## 2021-02-02 DIAGNOSIS — N179 Acute kidney failure, unspecified: Secondary | ICD-10-CM | POA: Diagnosis not present

## 2021-02-15 ENCOUNTER — Other Ambulatory Visit: Payer: Self-pay | Admitting: Cardiology

## 2021-02-15 NOTE — Telephone Encounter (Signed)
Refill sent to pharmacy.   

## 2021-02-18 DIAGNOSIS — I10 Essential (primary) hypertension: Secondary | ICD-10-CM | POA: Diagnosis not present

## 2021-02-18 DIAGNOSIS — Z6825 Body mass index (BMI) 25.0-25.9, adult: Secondary | ICD-10-CM | POA: Diagnosis not present

## 2021-02-20 ENCOUNTER — Other Ambulatory Visit: Payer: Self-pay | Admitting: Oncology

## 2021-02-20 DIAGNOSIS — N183 Chronic kidney disease, stage 3 unspecified: Secondary | ICD-10-CM

## 2021-02-20 NOTE — Progress Notes (Signed)
[facility]  [address] [place],  [place]  [id number] [id number]  Clinic Day:  02/21/2021  Referring physician: Ronita Hipps, MD   HISTORY OF PRESENT ILLNESS:  The patient is a 75 y.o. female with anemia secondary to chronic renal insufficiency.  She receives red cell shot therapy intermittently, which has been effective in keeping her hemoglobin at or above 10.  She comes in today to reassess her anemia.  Since her last visit, the patient has been doing okay.   She denies having increased fatigue or any overt forms of blood loss which concern her for progressive anemia.    PHYSICAL EXAM:  Blood pressure (!) 150/66, pulse 78, temperature 98.2 F (36.8 C), resp. rate 16, height 5\' 3"  (1.6 m), weight 147 lb (66.7 kg), SpO2 93 %. Wt Readings from Last 3 Encounters:  02/21/21 147 lb (66.7 kg)  10/11/20 147 lb 4 oz (66.8 kg)  10/12/20 148 lb 8 oz (67.4 kg)   Body mass index is 26.04 kg/m. Performance status (ECOG): 1 - Symptomatic but completely ambulatory Physical Exam Constitutional:      Appearance: Normal appearance. She is not ill-appearing.  HENT:     Mouth/Throat:     Mouth: Mucous membranes are moist.     Pharynx: Oropharynx is clear. No oropharyngeal exudate or posterior oropharyngeal erythema.  Cardiovascular:     Rate and Rhythm: Normal rate and regular rhythm.     Heart sounds: No murmur heard. No friction rub. No gallop.   Pulmonary:     Effort: Pulmonary effort is normal. No respiratory distress.     Breath sounds: Normal breath sounds. No wheezing, rhonchi or rales.  Chest:  Breasts:     Right: No axillary adenopathy or supraclavicular adenopathy.     Left: No axillary adenopathy or supraclavicular adenopathy.    Abdominal:     General: Bowel sounds are normal. There is no distension.     Palpations: Abdomen is soft. There is no mass.     Tenderness: There is no abdominal tenderness.  Musculoskeletal:        General:  No swelling.     Right lower leg: No edema.     Left lower leg: No edema.  Lymphadenopathy:     Cervical: No cervical adenopathy.     Upper Body:     Right upper body: No supraclavicular or axillary adenopathy.     Left upper body: No supraclavicular or axillary adenopathy.     Lower Body: No right inguinal adenopathy. No left inguinal adenopathy.  Skin:    General: Skin is warm.     Coloration: Skin is not jaundiced.     Findings: No lesion or rash.  Neurological:     General: No focal deficit present.     Mental Status: She is alert and oriented to person, place, and time. Mental status is at baseline.     Cranial Nerves: Cranial nerves are intact.  Psychiatric:        Mood and Affect: Mood normal.        Behavior: Behavior normal.        Thought Content: Thought content normal.     LABS:   CBC Latest Ref Rng & Units 02/21/2021 01/18/2021 12/09/2020  WBC - 6.2 7.3 6.6  Hemoglobin 12.0 - 16.0 9.4(A) 11.1(A) 10.2(A)  Hematocrit 36 - 46 27(A) 32(A) 29(A)  Platelets 150 - 399 212 251 183   CMP Latest Ref Rng & Units  02/21/2021 01/18/2021 12/09/2020  Glucose 65 - 99 mg/dL - - -  BUN 4 - 21 26(A) 21 25(A)  Creatinine 0.5 - 1.1 1.0 1.0 1.0  Sodium 137 - 147 133(A) 131(A) 135(A)  Potassium 3.4 - 5.3 3.3(A) 3.7 3.2(A)  [facility] 99 - 108 94(A) 92(A) 91(A)  CO2 13 - 22 33(A) 28(A) 34(A)  Calcium 8.7 - 10.7 9.5 9.6 9.3  Total Protein 6.0 - 8.5 g/dL - - -  Total Bilirubin 0.0 - 1.2 mg/dL - - -  Alkaline Phos 25 - 125 87 - -  AST 13 - 35 30 - -  ALT 7 - 35 15 - -       ASSESSMENT & PLAN:  Assessment/Plan:  A 75 y.o. female with anemia secondary to renal insufficiency.  As her hemoglobin is below 10, I will arrange for her to receive Retracrit 40,000 units.  This dose will be repeated if her monthly CBC checks show her hemoglobin to be less than 10.  I will see her back in 3 months for repeat clinical assessment.  The patient understands all the plans discussed today and is in  agreement with them.      Dequincy Macarthur Critchley, MD

## 2021-02-21 ENCOUNTER — Other Ambulatory Visit: Payer: Self-pay | Admitting: Hematology and Oncology

## 2021-02-21 ENCOUNTER — Inpatient Hospital Stay (INDEPENDENT_AMBULATORY_CARE_PROVIDER_SITE_OTHER): Payer: Medicare Other | Admitting: Oncology

## 2021-02-21 ENCOUNTER — Inpatient Hospital Stay: Payer: Medicare Other | Attending: Oncology

## 2021-02-21 ENCOUNTER — Other Ambulatory Visit: Payer: Self-pay

## 2021-02-21 ENCOUNTER — Other Ambulatory Visit: Payer: Self-pay | Admitting: Oncology

## 2021-02-21 VITALS — BP 150/66 | HR 78 | Temp 98.2°F | Resp 16 | Ht 63.0 in | Wt 147.0 lb

## 2021-02-21 DIAGNOSIS — N189 Chronic kidney disease, unspecified: Secondary | ICD-10-CM | POA: Diagnosis not present

## 2021-02-21 DIAGNOSIS — D631 Anemia in chronic kidney disease: Secondary | ICD-10-CM | POA: Diagnosis not present

## 2021-02-21 DIAGNOSIS — D649 Anemia, unspecified: Secondary | ICD-10-CM | POA: Diagnosis not present

## 2021-02-21 DIAGNOSIS — N183 Chronic kidney disease, stage 3 unspecified: Secondary | ICD-10-CM

## 2021-02-21 LAB — COMPREHENSIVE METABOLIC PANEL
Albumin: 4.3 (ref 3.5–5.0)
Calcium: 9.5 (ref 8.7–10.7)

## 2021-02-21 LAB — CBC AND DIFFERENTIAL
HCT: 27 — AB (ref 36–46)
Hemoglobin: 9.4 — AB (ref 12.0–16.0)
Neutrophils Absolute: 3.66
Platelets: 212 (ref 150–399)
WBC: 6.2

## 2021-02-21 LAB — HEPATIC FUNCTION PANEL
ALT: 15 (ref 7–35)
AST: 30 (ref 13–35)
Alkaline Phosphatase: 87 (ref 25–125)
Bilirubin, Total: 0.5

## 2021-02-21 LAB — IRON,TIBC AND FERRITIN PANEL
%SAT: 23.4
Ferritin: 101
Iron: 73
TIBC: 311

## 2021-02-21 LAB — BASIC METABOLIC PANEL
BUN: 26 — AB (ref 4–21)
CO2: 33 — AB (ref 13–22)
Chloride: 94 — AB (ref 99–108)
Creatinine: 1 (ref 0.5–1.1)
Glucose: 144
Potassium: 3.3 — AB (ref 3.4–5.3)
Sodium: 133 — AB (ref 137–147)

## 2021-02-21 LAB — CBC: RBC: 3.02 — AB (ref 3.87–5.11)

## 2021-03-01 NOTE — Addendum Note (Signed)
Addended by: Juanetta Beets on: 03/01/2021 12:44 PM   Modules accepted: Orders

## 2021-03-02 ENCOUNTER — Inpatient Hospital Stay: Payer: Medicare Other | Attending: Oncology

## 2021-03-02 ENCOUNTER — Other Ambulatory Visit: Payer: Self-pay

## 2021-03-02 VITALS — BP 133/53 | HR 73 | Temp 98.5°F | Resp 18 | Ht 63.0 in | Wt 144.8 lb

## 2021-03-02 DIAGNOSIS — D631 Anemia in chronic kidney disease: Secondary | ICD-10-CM | POA: Insufficient documentation

## 2021-03-02 DIAGNOSIS — N189 Chronic kidney disease, unspecified: Secondary | ICD-10-CM | POA: Diagnosis not present

## 2021-03-02 DIAGNOSIS — N183 Chronic kidney disease, stage 3 unspecified: Secondary | ICD-10-CM

## 2021-03-02 MED ORDER — EPOETIN ALFA-EPBX 40000 UNIT/ML IJ SOLN
INTRAMUSCULAR | Status: AC
  Filled 2021-03-02: qty 1

## 2021-03-02 MED ORDER — EPOETIN ALFA-EPBX 40000 UNIT/ML IJ SOLN
40000.0000 [IU] | Freq: Once | INTRAMUSCULAR | Status: AC
  Administered 2021-03-02: 40000 [IU] via SUBCUTANEOUS

## 2021-03-02 NOTE — Patient Instructions (Signed)

## 2021-03-25 ENCOUNTER — Other Ambulatory Visit: Payer: Self-pay | Admitting: Cardiology

## 2021-03-25 NOTE — Telephone Encounter (Signed)
Carvedilol approved and sent 

## 2021-03-31 ENCOUNTER — Telehealth: Payer: Self-pay

## 2021-03-31 ENCOUNTER — Inpatient Hospital Stay: Payer: Medicare Other | Attending: Oncology

## 2021-03-31 ENCOUNTER — Other Ambulatory Visit: Payer: Self-pay | Admitting: Hematology and Oncology

## 2021-03-31 ENCOUNTER — Other Ambulatory Visit: Payer: Self-pay

## 2021-03-31 DIAGNOSIS — D649 Anemia, unspecified: Secondary | ICD-10-CM | POA: Diagnosis not present

## 2021-03-31 DIAGNOSIS — D631 Anemia in chronic kidney disease: Secondary | ICD-10-CM | POA: Diagnosis not present

## 2021-03-31 DIAGNOSIS — N189 Chronic kidney disease, unspecified: Secondary | ICD-10-CM | POA: Insufficient documentation

## 2021-03-31 LAB — HEPATIC FUNCTION PANEL
ALT: 18 (ref 7–35)
AST: 33 (ref 13–35)
Alkaline Phosphatase: 75 (ref 25–125)
Bilirubin, Total: 0.5

## 2021-03-31 LAB — PROTEIN, TOTAL: Total Protein: 8.6 g/dL — AB (ref 6.3–8.2)

## 2021-03-31 LAB — BASIC METABOLIC PANEL
BUN: 31 — AB (ref 4–21)
CO2: 28 — AB (ref 13–22)
Chloride: 97 — AB (ref 99–108)
Creatinine: 1.4 — AB (ref 0.5–1.1)
Glucose: 144
Potassium: 2.8 — AB (ref 3.4–5.3)
Sodium: 138 (ref 137–147)

## 2021-03-31 LAB — IRON AND TIBC
Iron: 86 ug/dL (ref 28–170)
Saturation Ratios: 23 % (ref 10.4–31.8)
TIBC: 373 ug/dL (ref 250–450)
UIBC: 287 ug/dL

## 2021-03-31 LAB — CBC AND DIFFERENTIAL
HCT: 35 — AB (ref 36–46)
Hemoglobin: 11.8 — AB (ref 12.0–16.0)
Neutrophils Absolute: 4.87
Platelets: 258 (ref 150–399)
WBC: 8.7

## 2021-03-31 LAB — COMPREHENSIVE METABOLIC PANEL
Albumin: 5 (ref 3.5–5.0)
Calcium: 9.6 (ref 8.7–10.7)

## 2021-03-31 LAB — FERRITIN: Ferritin: 99 ng/mL (ref 11–307)

## 2021-03-31 LAB — CBC
MCV: 91 (ref 81–99)
RBC: 3.84 — AB (ref 3.87–5.11)

## 2021-03-31 MED ORDER — POTASSIUM CHLORIDE CRYS ER 20 MEQ PO TBCR: 20.0000 meq | EXTENDED_RELEASE_TABLET | Freq: Two times a day (BID) | ORAL | 0 refills | Status: DC

## 2021-03-31 NOTE — Addendum Note (Signed)
Addended by: Juanetta Beets on: 03/31/2021 03:41 PM   Modules accepted: Orders

## 2021-03-31 NOTE — Progress Notes (Unsigned)
The patient was here for labs only today.  Her potassium was 2.8, so I placed on potassium chloride 20 mEq twice daily for 7 days and asked her to follow-up with Dr. Helene Kelp office next week.  Her labs from today were faxed to Dr. Helene Kelp.  The patient verbalized understanding.

## 2021-03-31 NOTE — Telephone Encounter (Signed)
CALLED PATIENT AND CANCELLED HER APPOINTMENT FOR 04/01/21. HER HEMOGLOBIN IS 11.8. WE WILL HOLD THE RETACRIT SHOT. THE HUSBAND WILL LET THE PATIENT KNOW NOT TO COME.

## 2021-04-01 ENCOUNTER — Telehealth: Payer: Self-pay

## 2021-04-01 ENCOUNTER — Inpatient Hospital Stay: Payer: Medicare Other

## 2021-04-01 NOTE — Telephone Encounter (Signed)
Pt notified that lab results were sent to Dr Helene Kelp yesterday per [facility]. Pt also requesting that a copy of her lab results be mailed to her. Labs printed & placed in basket @ front desk for mail delivery.

## 2021-04-07 DIAGNOSIS — I10 Essential (primary) hypertension: Secondary | ICD-10-CM | POA: Diagnosis not present

## 2021-04-07 DIAGNOSIS — F419 Anxiety disorder, unspecified: Secondary | ICD-10-CM | POA: Diagnosis not present

## 2021-04-07 DIAGNOSIS — Z6824 Body mass index (BMI) 24.0-24.9, adult: Secondary | ICD-10-CM | POA: Diagnosis not present

## 2021-04-17 DIAGNOSIS — Z7902 Long term (current) use of antithrombotics/antiplatelets: Secondary | ICD-10-CM | POA: Diagnosis not present

## 2021-04-17 DIAGNOSIS — Z79899 Other long term (current) drug therapy: Secondary | ICD-10-CM | POA: Diagnosis not present

## 2021-04-17 DIAGNOSIS — M62830 Muscle spasm of back: Secondary | ICD-10-CM | POA: Diagnosis not present

## 2021-04-17 DIAGNOSIS — M5136 Other intervertebral disc degeneration, lumbar region: Secondary | ICD-10-CM | POA: Diagnosis not present

## 2021-04-17 DIAGNOSIS — Z880 Allergy status to penicillin: Secondary | ICD-10-CM | POA: Diagnosis not present

## 2021-04-17 DIAGNOSIS — I129 Hypertensive chronic kidney disease with stage 1 through stage 4 chronic kidney disease, or unspecified chronic kidney disease: Secondary | ICD-10-CM | POA: Diagnosis not present

## 2021-04-17 DIAGNOSIS — Z20822 Contact with and (suspected) exposure to covid-19: Secondary | ICD-10-CM | POA: Diagnosis not present

## 2021-04-17 DIAGNOSIS — E785 Hyperlipidemia, unspecified: Secondary | ICD-10-CM | POA: Diagnosis not present

## 2021-04-17 DIAGNOSIS — M4686 Other specified inflammatory spondylopathies, lumbar region: Secondary | ICD-10-CM | POA: Diagnosis not present

## 2021-04-17 DIAGNOSIS — M545 Low back pain, unspecified: Secondary | ICD-10-CM | POA: Diagnosis not present

## 2021-04-17 DIAGNOSIS — Z87891 Personal history of nicotine dependence: Secondary | ICD-10-CM | POA: Diagnosis not present

## 2021-04-17 DIAGNOSIS — E7800 Pure hypercholesterolemia, unspecified: Secondary | ICD-10-CM | POA: Diagnosis not present

## 2021-04-17 DIAGNOSIS — N10 Acute tubulo-interstitial nephritis: Secondary | ICD-10-CM | POA: Diagnosis not present

## 2021-04-17 DIAGNOSIS — N183 Chronic kidney disease, stage 3 unspecified: Secondary | ICD-10-CM | POA: Diagnosis not present

## 2021-04-17 DIAGNOSIS — Z955 Presence of coronary angioplasty implant and graft: Secondary | ICD-10-CM | POA: Diagnosis not present

## 2021-04-22 DIAGNOSIS — D485 Neoplasm of uncertain behavior of skin: Secondary | ICD-10-CM | POA: Diagnosis not present

## 2021-04-22 DIAGNOSIS — L821 Other seborrheic keratosis: Secondary | ICD-10-CM | POA: Diagnosis not present

## 2021-04-23 DIAGNOSIS — E785 Hyperlipidemia, unspecified: Secondary | ICD-10-CM | POA: Diagnosis not present

## 2021-04-23 DIAGNOSIS — I10 Essential (primary) hypertension: Secondary | ICD-10-CM | POA: Diagnosis not present

## 2021-04-23 DIAGNOSIS — G4700 Insomnia, unspecified: Secondary | ICD-10-CM | POA: Diagnosis not present

## 2021-04-29 ENCOUNTER — Inpatient Hospital Stay: Payer: Medicare Other | Attending: Oncology

## 2021-04-29 ENCOUNTER — Other Ambulatory Visit: Payer: Self-pay

## 2021-04-29 ENCOUNTER — Other Ambulatory Visit: Payer: Self-pay | Admitting: Hematology and Oncology

## 2021-04-29 DIAGNOSIS — D631 Anemia in chronic kidney disease: Secondary | ICD-10-CM | POA: Diagnosis not present

## 2021-04-29 DIAGNOSIS — D649 Anemia, unspecified: Secondary | ICD-10-CM | POA: Diagnosis not present

## 2021-04-29 DIAGNOSIS — N189 Chronic kidney disease, unspecified: Secondary | ICD-10-CM | POA: Insufficient documentation

## 2021-04-29 DIAGNOSIS — N183 Chronic kidney disease, stage 3 unspecified: Secondary | ICD-10-CM

## 2021-04-29 LAB — CBC AND DIFFERENTIAL
HCT: 30 — AB (ref 36–46)
Hemoglobin: 10 — AB (ref 12.0–16.0)
Neutrophils Absolute: 4.65
Platelets: 203 (ref 150–399)
WBC: 7.5

## 2021-04-29 LAB — CBC: RBC: 3.31 — AB (ref 3.87–5.11)

## 2021-05-02 ENCOUNTER — Inpatient Hospital Stay: Payer: Medicare Other

## 2021-05-02 ENCOUNTER — Other Ambulatory Visit: Payer: Self-pay

## 2021-05-02 VITALS — BP 120/56 | HR 67

## 2021-05-02 DIAGNOSIS — N183 Chronic kidney disease, stage 3 unspecified: Secondary | ICD-10-CM

## 2021-05-02 DIAGNOSIS — N189 Chronic kidney disease, unspecified: Secondary | ICD-10-CM | POA: Diagnosis not present

## 2021-05-02 DIAGNOSIS — D631 Anemia in chronic kidney disease: Secondary | ICD-10-CM | POA: Diagnosis not present

## 2021-05-02 MED ORDER — EPOETIN ALFA-EPBX 40000 UNIT/ML IJ SOLN
40000.0000 [IU] | Freq: Once | INTRAMUSCULAR | Status: AC
  Administered 2021-05-02: 40000 [IU] via SUBCUTANEOUS

## 2021-05-02 MED ORDER — EPOETIN ALFA-EPBX 40000 UNIT/ML IJ SOLN
INTRAMUSCULAR | Status: AC
  Filled 2021-05-02: qty 1

## 2021-05-02 NOTE — Patient Instructions (Signed)
[facility]  Discharge Instructions: Thank you for choosing [facility] to provide your oncology and hematology care.  If you have a lab appointment with the [facility], please go directly to the [facility] and check in at the registration area.   Wear comfortable clothing and clothing appropriate for easy access to any Portacath or PICC line.   We strive to give you quality time with your provider. You may need to reschedule your appointment if you arrive late (15 or more minutes).  Arriving late affects you and other patients whose appointments are after yours.  Also, if you miss three or more appointments without notifying the office, you may be dismissed from the clinic at the provider's discretion.      For prescription refill requests, have your pharmacy contact our office and allow 72 hours for refills to be completed.    Today you received the following: retacrit   To help prevent nausea and vomiting after your treatment, we encourage you to take your nausea medication as directed.  BELOW ARE SYMPTOMS THAT SHOULD BE REPORTED IMMEDIATELY: . *FEVER GREATER THAN 100.4 F (38 C) OR HIGHER . *CHILLS OR SWEATING . *NAUSEA AND VOMITING THAT IS NOT CONTROLLED WITH YOUR NAUSEA MEDICATION . *UNUSUAL SHORTNESS OF BREATH . *UNUSUAL BRUISING OR BLEEDING . *URINARY PROBLEMS (pain or burning when urinating, or frequent urination) . *BOWEL PROBLEMS (unusual diarrhea, constipation, pain near the anus) . TENDERNESS IN MOUTH AND THROAT WITH OR WITHOUT PRESENCE OF ULCERS (sore throat, sores in mouth, or a toothache) . UNUSUAL RASH, SWELLING OR PAIN  . UNUSUAL VAGINAL DISCHARGE OR ITCHING   Items with * indicate a potential emergency and should be followed up as soon as possible or go to the Emergency Department if any problems should occur.  Please show the CHEMOTHERAPY ALERT CARD or IMMUNOTHERAPY ALERT CARD at check-in to the Emergency Department and  triage nurse.  Should you have questions after your visit or need to cancel or reschedule your appointment, please contact [facility]  Dept: [phone]  and follow the prompts.  Office hours are 8:00 a.m. to 4:30 p.m. Monday - Friday. Please note that voicemails left after 4:00 p.m. may not be returned until the following business day.  We are closed weekends and major holidays. You have access to a nurse at all times for urgent questions. Please call the main number to the clinic Dept: [phone] and follow the prompts.  For any non-urgent questions, you may also contact your provider using MyChart. We now offer e-Visits for anyone 74 and older to request care online for non-urgent symptoms. For details visit [email or web address].   Also download the MyChart app! Go to the app store, search "MyChart", open the app, select [facility], and log in with your MyChart username and password.  Due to Covid, a mask is required upon entering the hospital/clinic. If you do not have a mask, one will be given to you upon arrival. For doctor visits, patients may have 1 support person aged 19 or older with them. For treatment visits, patients cannot have anyone with them due to current Covid guidelines and our immunocompromised population.

## 2021-05-03 DIAGNOSIS — I129 Hypertensive chronic kidney disease with stage 1 through stage 4 chronic kidney disease, or unspecified chronic kidney disease: Secondary | ICD-10-CM | POA: Diagnosis not present

## 2021-05-03 DIAGNOSIS — R7989 Other specified abnormal findings of blood chemistry: Secondary | ICD-10-CM | POA: Diagnosis not present

## 2021-05-03 DIAGNOSIS — R509 Fever, unspecified: Secondary | ICD-10-CM | POA: Diagnosis not present

## 2021-05-03 DIAGNOSIS — M069 Rheumatoid arthritis, unspecified: Secondary | ICD-10-CM | POA: Diagnosis not present

## 2021-05-03 DIAGNOSIS — N1831 Chronic kidney disease, stage 3a: Secondary | ICD-10-CM | POA: Diagnosis not present

## 2021-05-03 DIAGNOSIS — N390 Urinary tract infection, site not specified: Secondary | ICD-10-CM | POA: Diagnosis not present

## 2021-05-03 DIAGNOSIS — N179 Acute kidney failure, unspecified: Secondary | ICD-10-CM | POA: Diagnosis not present

## 2021-05-03 DIAGNOSIS — E785 Hyperlipidemia, unspecified: Secondary | ICD-10-CM | POA: Diagnosis not present

## 2021-05-17 NOTE — Progress Notes (Signed)
[facility]  [address] [place],  [place]  [address] [phone]  Clinic Day:  05/20/2021  Referring physician: Ronita Hipps, MD  This document serves as a record of services personally performed by Dequincy Macarthur Critchley, MD. It was created on their behalf by [facility], a trained medical scribe. The creation of this record is based on the scribe's personal observations and the provider's statements to them.  HISTORY OF PRESENT ILLNESS:  The patient is a 75 y.o. female with anemia secondary to chronic renal insufficiency.  She receives red cell shot therapy intermittently, which has been effective in keeping her hemoglobin at or above 10.  She comes in today to reassess her anemia.  Since her last visit, the patient has been doing okay.   She denies having increased fatigue or any overt forms of blood loss which concern her for progressive anemia.    PHYSICAL EXAM:  Blood pressure 138/63, pulse 84, temperature 98.3 F (36.8 C), resp. rate 16, height 5\' 3"  (1.6 m), weight 141 lb 9.6 oz (64.2 kg), SpO2 92 %. Wt Readings from Last 3 Encounters:  05/20/21 141 lb 9.6 oz (64.2 kg)  05/18/21 139 lb 12.8 oz (63.4 kg)  03/02/21 144 lb 12 oz (65.7 kg)   Body mass index is 25.08 kg/m. Performance status (ECOG): 1 - Symptomatic but completely ambulatory Physical Exam Constitutional:      Appearance: Normal appearance. She is not ill-appearing.  HENT:     Mouth/Throat:     Mouth: Mucous membranes are moist.     Pharynx: Oropharynx is clear. No oropharyngeal exudate or posterior oropharyngeal erythema.  Cardiovascular:     Rate and Rhythm: Normal rate and regular rhythm.     Heart sounds: No murmur heard. No friction rub. No gallop.   Pulmonary:     Effort: Pulmonary effort is normal. No respiratory distress.     Breath sounds: Normal breath sounds. No wheezing, rhonchi or rales.  Chest:  Breasts:     Right: No axillary adenopathy or supraclavicular  adenopathy.     Left: No axillary adenopathy or supraclavicular adenopathy.    Abdominal:     General: Bowel sounds are normal. There is no distension.     Palpations: Abdomen is soft. There is no mass.     Tenderness: There is no abdominal tenderness.  Musculoskeletal:        General: No swelling.     Right lower leg: No edema.     Left lower leg: No edema.  Lymphadenopathy:     Cervical: No cervical adenopathy.     Upper Body:     Right upper body: No supraclavicular or axillary adenopathy.     Left upper body: No supraclavicular or axillary adenopathy.     Lower Body: No right inguinal adenopathy. No left inguinal adenopathy.  Skin:    General: Skin is warm.     Coloration: Skin is not jaundiced.     Findings: No lesion or rash.  Neurological:     General: No focal deficit present.     Mental Status: She is alert and oriented to person, place, and time. Mental status is at baseline.     Cranial Nerves: Cranial nerves are intact.  Psychiatric:        Mood and Affect: Mood normal.        Behavior: Behavior normal.        Thought Content: Thought content normal.     LABS:  CBC Latest Ref Rng & Units 05/20/2021 04/29/2021 03/31/2021  WBC - 7.7 7.5 8.7  Hemoglobin 12.0 - 16.0 10.9(A) 10.0(A) 11.8(A)  Hematocrit 36 - 46 33(A) 30(A) 35(A)  Platelets 150 - 399 225 203 258   CMP Latest Ref Rng & Units 03/31/2021 02/21/2021 01/18/2021  Glucose 65 - 99 mg/dL - - -  BUN 4 - 21 31(A) 26(A) 21  Creatinine 0.5 - 1.1 1.4(A) 1.0 1.0  Sodium 137 - 147 138 133(A) 131(A)  Potassium 3.4 - 5.3 2.8(A) 3.3(A) 3.7  Chloride 99 - 108 97(A) 94(A) 92(A)  CO2 13 - 22 28(A) 33(A) 28(A)  Calcium 8.7 - 10.7 9.6 9.5 9.6  Total Protein 6.3 - 8.2 g/dL 8.6(A) - -  Total Bilirubin 0.0 - 1.2 mg/dL - - -  Alkaline Phos 25 - 125 75 87 -  AST 13 - 35 33 30 -  ALT 7 - 35 18 15 -   ASSESSMENT & PLAN:  Assessment/Plan:  A 75 y.o. female with anemia secondary to renal insufficiency.  I am pleased as her  hemoglobin remains above 10.  Based upon this, red cell shot therapy is not necessary for now.  Moving forward, her CBC will be checked every 2 months to ensure there is no precipitous decline in her counts.  I will see her back in 4 months for repeat clinical assessment.  The patient understands all the plans discussed today and is in agreement with them.    I, Rita Ohara, am acting as scribe for Marice Potter, MD    I have reviewed this report as typed by the medical scribe, and it is complete and accurate.  Dequincy Macarthur Critchley, MD

## 2021-05-18 ENCOUNTER — Other Ambulatory Visit: Payer: Self-pay

## 2021-05-18 ENCOUNTER — Encounter: Payer: Self-pay | Admitting: Cardiology

## 2021-05-18 ENCOUNTER — Ambulatory Visit (INDEPENDENT_AMBULATORY_CARE_PROVIDER_SITE_OTHER): Payer: Medicare Other | Admitting: Cardiology

## 2021-05-18 VITALS — BP 120/60 | HR 93 | Ht 63.0 in | Wt 139.8 lb

## 2021-05-18 DIAGNOSIS — E785 Hyperlipidemia, unspecified: Secondary | ICD-10-CM | POA: Diagnosis not present

## 2021-05-18 DIAGNOSIS — I10 Essential (primary) hypertension: Secondary | ICD-10-CM

## 2021-05-18 DIAGNOSIS — I25119 Atherosclerotic heart disease of native coronary artery with unspecified angina pectoris: Secondary | ICD-10-CM | POA: Diagnosis not present

## 2021-05-18 MED ORDER — ISOSORBIDE MONONITRATE ER 60 MG PO TB24: 60.0000 mg | ORAL_TABLET | Freq: Every day | ORAL | 3 refills | Status: DC

## 2021-05-18 MED ORDER — NITROGLYCERIN 0.4 MG SL SUBL: SUBLINGUAL_TABLET | SUBLINGUAL | 3 refills | Status: DC

## 2021-05-18 NOTE — Patient Instructions (Signed)
Medication Instructions:  Your physician has recommended you make the following change in your medication:  INCREASE: Imdur 60 mg take one tablet by mouth daily.  *If you need a refill on your cardiac medications before your next appointment, please call your pharmacy*   Lab Work: None If you have labs (blood work) drawn today and your tests are completely normal, you will receive your results only by: Marland Kitchen MyChart Message (if you have MyChart) OR . A paper copy in the mail If you have any lab test that is abnormal or we need to change your treatment, we will call you to review the results.   Testing/Procedures:   [facility] [address] [place], [place] [address] Phone:  [phone]    Please arrive 15 minutes prior to your appointment time for registration and insurance purposes.  The test will take approximately 3 to 4 hours to complete; you may bring reading material.  If someone comes with you to your appointment, they will need to remain in the main lobby due to limited space in the testing area. **If you are pregnant or breastfeeding, please notify the nuclear lab prior to your appointment**  How to prepare for your Myocardial Perfusion Test: . Do not eat or drink 3 hours prior to your test, except you may have water. . Do not consume products containing caffeine (regular or decaffeinated) 12 hours prior to your test. (ex: coffee, chocolate, sodas, tea). . Do bring a list of your current medications with you.  If not listed below, you may take your medications as normal. . Do wear comfortable clothes (no dresses or overalls) and walking shoes, tennis shoes preferred (No heels or open toe shoes are allowed). . Do NOT wear cologne, perfume, aftershave, or lotions (deodorant is allowed). . If these instructions are not followed, your test will have to be rescheduled.  Please report to [address] for your test.  If you have questions or  concerns about your appointment, you can call the [facility] at [phone].  If you cannot keep your appointment, please provide 24 hours notification to the Nuclear Lab, to avoid a possible $50 charge to your account.    Follow-Up: At [facility], you and your health needs are our priority.  As part of our continuing mission to provide you with exceptional heart care, we have created designated Provider Care Teams.  These Care Teams include your primary Cardiologist (physician) and Advanced Practice Providers (APPs -  Physician Assistants and Nurse Practitioners) who all work together to provide you with the care you need, when you need it.  We recommend signing up for the patient portal called "MyChart".  Sign up information is provided on this After Visit Summary.  MyChart is used to connect with patients for Virtual Visits (Telemedicine).  Patients are able to view lab/test results, encounter notes, upcoming appointments, etc.  Non-urgent messages can be sent to your provider as well.   To learn more about what you can do with MyChart, go to [email or web address].    Your next appointment:   6 week(s)  The format for your next appointment:   In Person  Provider:   [name], MD   Other Instructions

## 2021-05-18 NOTE — Progress Notes (Signed)
Cardiology Office Note:    Date:  05/18/2021   ID:  Angela Mccoy, DOB 03/22/1946, MRN [id number]  PCP:  Angela Hipps, MD  Cardiologist:  Angela More, MD    Referring MD: Angela Hipps, MD    ASSESSMENT:    1. Coronary artery disease involving native coronary artery of native heart with angina pectoris ([place])   2. Essential hypertension   3. Hyperlipidemia, unspecified hyperlipidemia type    PLAN:    In order of problems listed above:  1. Her symptoms are atypical but raise concern for progressive CAD we discussed either direct referral to coronary angiography or perfusion study in our office she prefers the latter and we will arrange this to be done and if abnormal with ischemia she will need to be referred for coronary angiography. 2. Stable BP at target continue current antihypertensives 3. Stable continue statin with CAD   Next appointment: 6 weeks   Medication Adjustments/Labs and Tests Ordered: Current medicines are reviewed at length with the patient today.  Concerns regarding medicines are outlined above.  Orders Placed This Encounter  Procedures  . MYOCARDIAL PERFUSION IMAGING   Meds ordered this encounter  Medications  . isosorbide mononitrate (IMDUR) 60 MG 24 hr tablet    Sig: Take 1 tablet (60 mg total) by mouth daily.    Dispense:  90 tablet    Refill:  3  . nitroGLYCERIN (NITROSTAT) 0.4 MG SL tablet    Sig: ONE TABLET UNDER TONGUE AS NEEDED FOR CHEST PAIN EVERY 5 MINUTES    Dispense:  25 tablet    Refill:  3    No chief complaint on file.   History of Present Illness:    Angela Mccoy is a 75 y.o. female with a hx of CAD with PCI and drug-eluting stent to the LAD and right coronary artery in 2003 dyslipidemia hypertension iron deficiency anemia and chronic lower extremity edema last seen 10/07/2020. Compliance with diet, lifestyle and medications: Yes  Is a difficult time for months the anniversary of her daughter's  death. Last month she has had 3 episodes of left-sided chest pain pressure radiating to the left neck into the left arm not exertional but relieved with rest and up to 3 nitroglycerin. She wonders if this is related to grieving. She is not having exertional chest pain edema shortness of breath palpitation or syncope  Remains anemic with a recent hemoglobin 10.02 weeks ago and CMP a month ago showed a potassium of 3.2 GFR 64 cc creatinine 0.9 Past Medical History:  Diagnosis Date  . Anemia 07/31/2017  . Anemia in chronic kidney disease 10/03/2020  . Carotid bruit 02/26/2016  . Carotid bruit present 02/26/2016  . Cavitary pneumonia 03/20/2019   Onset of symptoms around late  Feb 2020 > all symptoms resolved p 5 days Meropenem and rx x 2 weeks then changed to cleocin -  D/c cleocin 03/20/2019  With esr 12, wbc 7,800  -  Quant TB 03/20/2019  Neg  - 05/14/2019 cxr with minimal streaky residual, no as dz   . Coronary artery disease involving native coronary artery 02/26/2016  . Coronary artery disease involving native coronary artery of native heart with angina pectoris ([place]) 03/12/2016   Overview:  PCI and DES to LAD and RCA in 2003, cath 2010 wo restenosis  . Essential hypertension 03/12/2016  . High cholesterol 01/17/2017  . Hx of diverticulitis of colon 09/11/2016  . Hyperlipidemia 03/12/2016  . Hypertension 01/27/2016  .  Insomnia 02/26/2016  . Migraine with aura 02/26/2016  . Mitral valve prolapse 02/26/2016  . Peripheral vascular disease ([place]) 02/26/2016  . Persistent atrial fibrillation ([place]) 11/06/2019  . Pneumonia of right upper lobe due to infectious organism 02/26/2019  . Pre-op evaluation 11/19/2019  . Rheumatoid arthritis involving multiple joints ([place]) 02/26/2016  . Rheumatoid arthritis involving multiple sites [facility]) 02/26/2016    Past Surgical History:  Procedure Laterality Date  . ABDOMINAL HYSTERECTOMY    . CHOLECYSTECTOMY      Current Medications: Current Meds  Medication Sig  . amLODipine  (NORVASC) 5 MG tablet Take 5 mg by mouth daily.  . carvedilol (COREG) 6.25 MG tablet TAKE 1 TABLET(6.25 MG) BY MOUTH TWICE DAILY WITH A MEAL  . chlorthalidone (HYGROTON) 25 MG tablet Take 25 mg by mouth daily.  . citalopram (CELEXA) 40 MG tablet TAKE 1 TABLET BY MOUTH EVERY DAY  . clopidogrel (PLAVIX) 75 MG tablet TAKE 1 TABLET(75 MG) BY MOUTH DAILY  . isosorbide mononitrate (IMDUR) 60 MG 24 hr tablet Take 1 tablet (60 mg total) by mouth daily.  Marland Kitchen LORazepam (ATIVAN) 0.5 MG tablet Take 0.5 mg by mouth 2 (two) times daily.  . pantoprazole (PROTONIX) 40 MG tablet TAKE 1 TABLET(40 MG) BY MOUTH DAILY  . pravastatin (PRAVACHOL) 40 MG tablet TAKE 1 TABLET(40 MG) BY MOUTH DAILY  . QUEtiapine (SEROQUEL) 200 MG tablet Take 200 mg by mouth daily.   . traMADol (ULTRAM) 50 MG tablet Take 50 mg by mouth every 6 (six) hours as needed (pain).   . vitamin E 400 UNIT capsule Take 400 Units by mouth daily.  . [DISCONTINUED] isosorbide mononitrate (IMDUR) 30 MG 24 hr tablet TAKE 1 TABLET(30 MG) BY MOUTH DAILY  . [DISCONTINUED] nitroGLYCERIN (NITROSTAT) 0.4 MG SL tablet ONE TABLET UNDER TONGUE AS NEEDED FOR CHEST PAIN EVERY 5 MINUTES     Allergies:   Penicillins   Social History   Socioeconomic History  . Marital status: Married    Spouse name: Not on file  . Number of children: Not on file  . Years of education: Not on file  . Highest education level: Not on file  Occupational History  . Not on file  Tobacco Use  . Smoking status: Former Smoker    Packs/day: 1.00    Years: 18.00    Pack years: 18.00    Types: Cigarettes    Quit date: 12/26/1987    Years since quitting: 33.4  . Smokeless tobacco: Never Used  Vaping Use  . Vaping Use: Never used  Substance and Sexual Activity  . Alcohol use: No  . Drug use: Never  . Sexual activity: Not on file  Other Topics Concern  . Not on file  Social History Narrative  . Not on file   Social Determinants of Health   Financial Resource Strain: Not on  file  Food Insecurity: Not on file  Transportation Needs: Not on file  Physical Activity: Not on file  Stress: Not on file  Social Connections: Not on file     Family History: The patient's family history includes Breast cancer in her sister; Heart attack in her brother, mother, and sister; Lung cancer in her brother. There is no history of Asthma. ROS:   Please see the history of present illness.    All other systems reviewed and are negative.  EKGs/Labs/Other Studies Reviewed:    The following studies were reviewed today:  EKG:  EKG ordered today and personally reviewed.  The ekg ordered  today demonstrates sinus rhythm diffuse ST depressions similar to previous EKGs  Recent Labs: 03/31/2021: ALT 18; BUN 31; Creatinine 1.4; Potassium 2.8; Sodium 138 04/29/2021: Hemoglobin 10.0; Platelets 203  Recent Lipid Panel    Component Value Date/Time   CHOL 135 10/11/2020 0000   CHOL 137 11/06/2019 1122   TRIG 294 (A) 10/11/2020 0000   HDL 33 (A) 10/11/2020 0000   HDL 46 11/06/2019 1122   CHOLHDL 3.0 11/06/2019 1122   LDLCALC 43 10/11/2020 0000   LDLCALC 55 11/06/2019 1122    Physical Exam:    VS:  BP 120/60 (BP Location: Right Arm, Patient Position: Sitting, Cuff Size: Normal)   Pulse 93   Ht 5' 3"  (1.6 m)   Wt 139 lb 12.8 oz (63.4 kg)   SpO2 95%   BMI 24.76 kg/m     Wt Readings from Last 3 Encounters:  05/18/21 139 lb 12.8 oz (63.4 kg)  03/02/21 144 lb 12 oz (65.7 kg)  02/21/21 147 lb (66.7 kg)     GEN:  Well nourished, well developed in no acute distress HEENT: Normal NECK: No JVD; No carotid bruits LYMPHATICS: No lymphadenopathy CARDIAC: RRR, no murmurs, rubs, gallops RESPIRATORY:  Clear to auscultation without rales, wheezing or rhonchi  ABDOMEN: Soft, non-tender, non-distended MUSCULOSKELETAL:  No edema; No deformity  SKIN: Warm and dry NEUROLOGIC:  Alert and oriented x 3 PSYCHIATRIC:  Normal affect    Signed, Angela More, MD  05/18/2021 3:39 PM    [facility]

## 2021-05-18 NOTE — Addendum Note (Signed)
Addended by: Resa Miner I on: 05/18/2021 03:56 PM   Modules accepted: Orders

## 2021-05-20 ENCOUNTER — Telehealth: Payer: Self-pay | Admitting: Oncology

## 2021-05-20 ENCOUNTER — Inpatient Hospital Stay: Payer: Medicare Other

## 2021-05-20 ENCOUNTER — Other Ambulatory Visit: Payer: Self-pay | Admitting: Oncology

## 2021-05-20 ENCOUNTER — Encounter: Payer: Self-pay | Admitting: Oncology

## 2021-05-20 ENCOUNTER — Other Ambulatory Visit: Payer: Self-pay

## 2021-05-20 ENCOUNTER — Inpatient Hospital Stay (INDEPENDENT_AMBULATORY_CARE_PROVIDER_SITE_OTHER): Payer: Medicare Other | Admitting: Oncology

## 2021-05-20 VITALS — BP 138/63 | HR 84 | Temp 98.3°F | Resp 16 | Ht 63.0 in | Wt 141.6 lb

## 2021-05-20 DIAGNOSIS — D631 Anemia in chronic kidney disease: Secondary | ICD-10-CM

## 2021-05-20 DIAGNOSIS — N183 Chronic kidney disease, stage 3 unspecified: Secondary | ICD-10-CM

## 2021-05-20 DIAGNOSIS — N189 Chronic kidney disease, unspecified: Secondary | ICD-10-CM

## 2021-05-20 DIAGNOSIS — D649 Anemia, unspecified: Secondary | ICD-10-CM | POA: Diagnosis not present

## 2021-05-20 LAB — CBC AND DIFFERENTIAL
HCT: 33 — AB (ref 36–46)
Hemoglobin: 10.9 — AB (ref 12.0–16.0)
Neutrophils Absolute: 4.7
Platelets: 225 (ref 150–399)
WBC: 7.7

## 2021-05-20 LAB — CBC: RBC: 3.6 — AB (ref 3.87–5.11)

## 2021-05-20 NOTE — Telephone Encounter (Signed)
Per 5/27 LOS, patient scheduled for July Labs, Aug Appt's.  Gave patient Appt Summary/Calendar

## 2021-05-24 ENCOUNTER — Telehealth: Payer: Self-pay | Admitting: *Deleted

## 2021-05-24 NOTE — Telephone Encounter (Signed)
Patient given detailed instructions per Myocardial Perfusion Study Information Sheet for the test on 05/26/21 at 0745. Patient notified to arrive 15 minutes early and that it is imperative to arrive on time for appointment to keep from having the test rescheduled.  If you need to cancel or reschedule your appointment, please call the office within 24 hours of your appointment. . Patient verbalized understanding.Brynja Marker, Ranae [place] No mychart

## 2021-05-24 NOTE — Addendum Note (Signed)
Addended byShirlee More on: 05/24/2021 09:21 AM   Modules accepted: Orders

## 2021-05-26 ENCOUNTER — Ambulatory Visit (INDEPENDENT_AMBULATORY_CARE_PROVIDER_SITE_OTHER): Payer: Medicare Other

## 2021-05-26 ENCOUNTER — Other Ambulatory Visit: Payer: Self-pay

## 2021-05-26 VITALS — Ht 63.0 in | Wt 139.0 lb

## 2021-05-26 DIAGNOSIS — I10 Essential (primary) hypertension: Secondary | ICD-10-CM

## 2021-05-26 DIAGNOSIS — E785 Hyperlipidemia, unspecified: Secondary | ICD-10-CM

## 2021-05-26 DIAGNOSIS — I25119 Atherosclerotic heart disease of native coronary artery with unspecified angina pectoris: Secondary | ICD-10-CM | POA: Diagnosis not present

## 2021-05-26 DIAGNOSIS — R110 Nausea: Secondary | ICD-10-CM

## 2021-05-26 LAB — MYOCARDIAL PERFUSION IMAGING
LV dias vol: 49 mL (ref 46–106)
LV sys vol: 16 mL
Peak HR: 93 {beats}/min
Rest HR: 69 {beats}/min
SDS: 4
SRS: 3
SSS: 7
TID: 0.88

## 2021-05-26 MED ORDER — REGADENOSON 0.4 MG/5ML IV SOLN
0.4000 mg | Freq: Once | INTRAVENOUS | Status: AC
  Administered 2021-05-26: 0.4 mg via INTRAVENOUS

## 2021-05-26 MED ORDER — AMINOPHYLLINE 25 MG/ML IV SOLN
150.0000 mg | Freq: Once | INTRAVENOUS | Status: AC
  Administered 2021-05-26: 150 mg via INTRAVENOUS

## 2021-05-26 MED ORDER — TECHNETIUM TC 99M TETROFOSMIN IV KIT
10.3000 | PACK | Freq: Once | INTRAVENOUS | Status: AC | PRN
  Administered 2021-05-26: 10.3 via INTRAVENOUS

## 2021-05-26 MED ORDER — TECHNETIUM TC 99M TETROFOSMIN IV KIT
32.4000 | PACK | Freq: Once | INTRAVENOUS | Status: AC | PRN
  Administered 2021-05-26: 32.4 via INTRAVENOUS

## 2021-05-27 ENCOUNTER — Telehealth: Payer: Self-pay

## 2021-05-27 ENCOUNTER — Inpatient Hospital Stay: Payer: Medicare Other

## 2021-05-27 NOTE — Telephone Encounter (Signed)
Spoke with patient regarding results and recommendation.  Patient verbalizes understanding and is agreeable to plan of care. Advised patient to call back with any issues or concerns.  

## 2021-05-27 NOTE — Telephone Encounter (Signed)
-----   Message from Richardo Priest, MD sent at 05/27/2021  8:10 AM EDT ----- Good result normal  No change in treatment

## 2021-05-30 ENCOUNTER — Ambulatory Visit: Payer: Medicare Other

## 2021-06-18 ENCOUNTER — Encounter: Payer: Self-pay | Admitting: Oncology

## 2021-07-03 DIAGNOSIS — R079 Chest pain, unspecified: Secondary | ICD-10-CM | POA: Diagnosis not present

## 2021-07-03 DIAGNOSIS — R0602 Shortness of breath: Secondary | ICD-10-CM | POA: Diagnosis not present

## 2021-07-03 DIAGNOSIS — N3000 Acute cystitis without hematuria: Secondary | ICD-10-CM | POA: Diagnosis not present

## 2021-07-03 DIAGNOSIS — R6883 Chills (without fever): Secondary | ICD-10-CM | POA: Diagnosis not present

## 2021-07-03 DIAGNOSIS — I2510 Atherosclerotic heart disease of native coronary artery without angina pectoris: Secondary | ICD-10-CM | POA: Diagnosis not present

## 2021-07-03 DIAGNOSIS — N183 Chronic kidney disease, stage 3 unspecified: Secondary | ICD-10-CM | POA: Diagnosis not present

## 2021-07-03 DIAGNOSIS — F419 Anxiety disorder, unspecified: Secondary | ICD-10-CM | POA: Diagnosis not present

## 2021-07-03 DIAGNOSIS — I4891 Unspecified atrial fibrillation: Secondary | ICD-10-CM | POA: Diagnosis not present

## 2021-07-03 DIAGNOSIS — R531 Weakness: Secondary | ICD-10-CM | POA: Diagnosis not present

## 2021-07-03 DIAGNOSIS — E7800 Pure hypercholesterolemia, unspecified: Secondary | ICD-10-CM | POA: Diagnosis not present

## 2021-07-03 DIAGNOSIS — R42 Dizziness and giddiness: Secondary | ICD-10-CM | POA: Diagnosis not present

## 2021-07-03 DIAGNOSIS — R410 Disorientation, unspecified: Secondary | ICD-10-CM | POA: Diagnosis not present

## 2021-07-03 DIAGNOSIS — R61 Generalized hyperhidrosis: Secondary | ICD-10-CM | POA: Diagnosis not present

## 2021-07-03 DIAGNOSIS — Z20822 Contact with and (suspected) exposure to covid-19: Secondary | ICD-10-CM | POA: Diagnosis not present

## 2021-07-03 DIAGNOSIS — I129 Hypertensive chronic kidney disease with stage 1 through stage 4 chronic kidney disease, or unspecified chronic kidney disease: Secondary | ICD-10-CM | POA: Diagnosis not present

## 2021-07-03 DIAGNOSIS — T50905A Adverse effect of unspecified drugs, medicaments and biological substances, initial encounter: Secondary | ICD-10-CM | POA: Diagnosis not present

## 2021-07-03 DIAGNOSIS — E876 Hypokalemia: Secondary | ICD-10-CM | POA: Diagnosis not present

## 2021-07-03 DIAGNOSIS — R110 Nausea: Secondary | ICD-10-CM | POA: Diagnosis not present

## 2021-07-03 DIAGNOSIS — Z87891 Personal history of nicotine dependence: Secondary | ICD-10-CM | POA: Diagnosis not present

## 2021-07-03 DIAGNOSIS — D631 Anemia in chronic kidney disease: Secondary | ICD-10-CM | POA: Diagnosis not present

## 2021-07-03 DIAGNOSIS — Z9049 Acquired absence of other specified parts of digestive tract: Secondary | ICD-10-CM | POA: Diagnosis not present

## 2021-07-03 DIAGNOSIS — Z90710 Acquired absence of both cervix and uterus: Secondary | ICD-10-CM | POA: Diagnosis not present

## 2021-07-03 DIAGNOSIS — R55 Syncope and collapse: Secondary | ICD-10-CM | POA: Diagnosis not present

## 2021-07-03 DIAGNOSIS — Z955 Presence of coronary angioplasty implant and graft: Secondary | ICD-10-CM | POA: Diagnosis not present

## 2021-07-03 DIAGNOSIS — R0600 Dyspnea, unspecified: Secondary | ICD-10-CM | POA: Diagnosis not present

## 2021-07-03 DIAGNOSIS — R9431 Abnormal electrocardiogram [ECG] [EKG]: Secondary | ICD-10-CM | POA: Diagnosis not present

## 2021-07-04 ENCOUNTER — Other Ambulatory Visit: Payer: Self-pay

## 2021-07-05 DIAGNOSIS — Z6824 Body mass index (BMI) 24.0-24.9, adult: Secondary | ICD-10-CM | POA: Diagnosis not present

## 2021-07-05 DIAGNOSIS — N390 Urinary tract infection, site not specified: Secondary | ICD-10-CM | POA: Diagnosis not present

## 2021-07-05 DIAGNOSIS — Z1331 Encounter for screening for depression: Secondary | ICD-10-CM | POA: Diagnosis not present

## 2021-07-05 DIAGNOSIS — Z79899 Other long term (current) drug therapy: Secondary | ICD-10-CM | POA: Diagnosis not present

## 2021-07-05 DIAGNOSIS — I10 Essential (primary) hypertension: Secondary | ICD-10-CM | POA: Diagnosis not present

## 2021-07-05 NOTE — Progress Notes (Signed)
Cardiology Office Note:    Date:  07/06/2021   ID:  Angela Mccoy, DOB 1946-12-18, MRN [id number]  PCP:  Angela Hipps, MD  Cardiologist:  Angela More, MD    Referring MD: Angela Hipps, MD    ASSESSMENT:    1. Essential hypertension   2. Coronary artery disease involving native coronary artery of native heart with angina pectoris ([place])   3. Hyperlipidemia, unspecified hyperlipidemia type    PLAN:    In order of problems listed above:  Blood pressure is poorly controlled previously had been on clonidine with her CNS side effects of other medications I would not restart them can place her on an ARB along with her diuretic she tells me labs done in the last day or 2 shows normal potassium continue supplements and carvedilol.  I asked her in 3 weeks to use MyChart to send me a list of her blood pressures.  Her daughter and RN is now involved supervising medications and blood pressure.  We will have her take her previous doses of Imdur and pravastatin. Stable CAD no anginal discomfort recent perfusion study was normal continue medical treatment New York Heart Association class I Continue pravastatin 40 mg/day   Next appointment: 3 months with change in blood pressure medications   Medication Adjustments/Labs and Tests Ordered: Current medicines are reviewed at length with the patient today.  Concerns regarding medicines are outlined above.  No orders of the defined types were placed in this encounter.  No orders of the defined types were placed in this encounter.   Chief Complaint  Patient presents with   Follow-up   Coronary Artery Disease    Recent myocardial perfusion study showed normal perfusion and ejection fraction 67% low risk test    History of Present Illness:    Angela Mccoy is a 75 y.o. female with a hx of CAD with remote PCI and drug-eluting stent to LAD and right coronary artery in 2003 dyslipidemia hypertension iron deficiency  anemia chronic lower extremity edema last seen 05/18/2021 with increasing frequency of episodes of angina.  Compliance with diet, lifestyle and medications: Yes  Is a very complex case she took several medications and had subsequent confusion perhaps delirium tramadol and benzodiazepine.  These were stopped She had a urinary tract infection. She was lightheaded no orthostatic shift and blood pressure and hypertensive and had been out of her medications for a day when she was traveling with her family. Home blood pressure runs 150-160/70-80. She is somewhat confused about what dose of Imdur and statin to take. No edema chest pain palpitation or syncope  She underwent a myocardial perfusion test 05/26/2021 showing normal perfusion EF 67% no ischemic ST changes as performed pharmacologically with Lexiscan. She was seen at [facility] ED 07/03/2021 with elevated blood pressure laboratory studies showed mild anemia hemoglobin 11.2 creatinine normal 1.10 potassium is diminished 3.0 EKG showed sinus rhythm with nonspecific ST-T changes troponin I undetectable D-dimer normal for age of 69 chest x-ray with no acute abnormality.  She has felt to have a urinary tract infection and was treated with IV ceftriaxone and also received IV fluids in the emergency room.  Further review shows an episode of near syncope prior to presentation.  Recent labs PCP 07/05/2021 hemoglobin 11.6 creatinine 0.9 GFR greater than 60 cc sodium mildly diminished at 135 potassium 3.6 cholesterol 139 LDL 39 triglyceride 302 HDL 40 Past Medical History:  Diagnosis Date   Anemia 07/31/2017   Anemia  in chronic kidney disease 10/03/2020   Carotid bruit 02/26/2016   Carotid bruit present 02/26/2016   Cavitary pneumonia 03/20/2019   Onset of symptoms around late  Feb 2020 > all symptoms resolved p 5 days Meropenem and rx x 2 weeks then changed to cleocin -  D/c cleocin 03/20/2019  With esr 12, wbc 7,800  -  Quant TB 03/20/2019  Neg  - 05/14/2019 cxr with  minimal streaky residual, no as dz    Coronary artery disease involving native coronary artery 02/26/2016   Coronary artery disease involving native coronary artery of native heart with angina pectoris ([place]) 03/12/2016   Overview:  PCI and DES to LAD and RCA in 2003, cath 2010 wo restenosis   Essential hypertension 03/12/2016   High cholesterol 01/17/2017   Hx of diverticulitis of colon 09/11/2016   Hyperlipidemia 03/12/2016   Hypertension 01/27/2016   Insomnia 02/26/2016   Migraine with aura 02/26/2016   Mitral valve prolapse 02/26/2016   Peripheral vascular disease ([place]) 02/26/2016   Persistent atrial fibrillation ([place]) 11/06/2019   Pneumonia of right upper lobe due to infectious organism 02/26/2019   Pre-op evaluation 11/19/2019   Rheumatoid arthritis involving multiple joints ([place]) 02/26/2016   Rheumatoid arthritis involving multiple sites ([place]) 02/26/2016    Past Surgical History:  Procedure Laterality Date   ABDOMINAL HYSTERECTOMY     CHOLECYSTECTOMY      Current Medications: Current Meds  Medication Sig   carvedilol (COREG) 6.25 MG tablet TAKE 1 TABLET(6.25 MG) BY MOUTH TWICE DAILY WITH A MEAL   chlorthalidone (HYGROTON) 25 MG tablet Take 25 mg by mouth daily.   citalopram (CELEXA) 40 MG tablet TAKE 1 TABLET BY MOUTH EVERY DAY   clopidogrel (PLAVIX) 75 MG tablet TAKE 1 TABLET(75 MG) BY MOUTH DAILY   isosorbide mononitrate (IMDUR) 30 MG 24 hr tablet Take 30 mg by mouth daily.   nitroGLYCERIN (NITROSTAT) 0.4 MG SL tablet ONE TABLET UNDER TONGUE AS NEEDED FOR CHEST PAIN EVERY 5 MINUTES   pantoprazole (PROTONIX) 40 MG tablet TAKE 1 TABLET(40 MG) BY MOUTH DAILY   potassium chloride SA (KLOR-CON) 20 MEQ tablet Take 1 tablet (20 mEq total) by mouth 2 (two) times daily. (Patient taking differently: Take 20 mEq by mouth daily.)   pravastatin (PRAVACHOL) 40 MG tablet TAKE 1 TABLET(40 MG) BY MOUTH DAILY   QUEtiapine (SEROQUEL) 200 MG tablet Take 200 mg by mouth daily.    sulfamethoxazole-trimethoprim  (BACTRIM DS) 800-160 MG tablet Take 1 tablet by mouth 2 (two) times daily.     Allergies:   Penicillins   Social History   Socioeconomic History   Marital status: Married    Spouse name: Not on file   Number of children: Not on file   Years of education: Not on file   Highest education level: Not on file  Occupational History   Not on file  Tobacco Use   Smoking status: Former    Packs/day: 1.00    Years: 18.00    Pack years: 18.00    Types: Cigarettes    Quit date: 12/26/1987    Years since quitting: 33.5   Smokeless tobacco: Never  Vaping Use   Vaping Use: Never used  Substance and Sexual Activity   Alcohol use: No   Drug use: Never   Sexual activity: Not on file  Other Topics Concern   Not on file  Social History Narrative   Not on file   Social Determinants of Health   Financial Resource Strain: Not  on file  Food Insecurity: Not on file  Transportation Needs: Not on file  Physical Activity: Not on file  Stress: Not on file  Social Connections: Not on file     Family History: The patient's family history includes Breast cancer in her sister; Heart attack in her brother, mother, and sister; Lung cancer in her brother. There is no history of Asthma. ROS:   Please see the history of present illness.    All other systems reviewed and are negative.  EKGs/Labs/Other Studies Reviewed:    The following studies were reviewed today:    Recent Labs: 03/31/2021: ALT 18; BUN 31; Creatinine 1.4; Potassium 2.8; Sodium 138 05/20/2021: Hemoglobin 10.9; Platelets 225  Recent Lipid Panel    Component Value Date/Time   CHOL 135 10/11/2020 0000   CHOL 137 11/06/2019 1122   TRIG 294 (A) 10/11/2020 0000   HDL 33 (A) 10/11/2020 0000   HDL 46 11/06/2019 1122   CHOLHDL 3.0 11/06/2019 1122   LDLCALC 43 10/11/2020 0000   LDLCALC 55 11/06/2019 1122    Physical Exam:    VS:  Ht 5' 3"  (1.6 m)   Wt 138 lb (62.6 kg)   SpO2 97%   BMI 24.45 kg/m     Wt Readings from Last 3  Encounters:  07/06/21 138 lb (62.6 kg)  05/26/21 139 lb (63 kg)  05/20/21 141 lb 9.6 oz (64.2 kg)     GEN:  Well nourished, well developed in no acute distress HEENT: Normal NECK: No JVD; No carotid bruits LYMPHATICS: No lymphadenopathy CARDIAC: RRR, no murmurs, rubs, gallops RESPIRATORY:  Clear to auscultation without rales, wheezing or rhonchi  ABDOMEN: Soft, non-tender, non-distended MUSCULOSKELETAL:  No edema; No deformity  SKIN: Warm and dry NEUROLOGIC:  Alert and oriented x 3 PSYCHIATRIC:  Normal affect    Signed, Angela More, MD  07/06/2021 9:27 AM    [facility]

## 2021-07-06 ENCOUNTER — Ambulatory Visit (INDEPENDENT_AMBULATORY_CARE_PROVIDER_SITE_OTHER): Payer: Medicare Other | Admitting: Cardiology

## 2021-07-06 ENCOUNTER — Other Ambulatory Visit: Payer: Self-pay

## 2021-07-06 ENCOUNTER — Encounter: Payer: Self-pay | Admitting: Cardiology

## 2021-07-06 VITALS — Ht 63.0 in | Wt 138.0 lb

## 2021-07-06 DIAGNOSIS — E785 Hyperlipidemia, unspecified: Secondary | ICD-10-CM

## 2021-07-06 DIAGNOSIS — I25119 Atherosclerotic heart disease of native coronary artery with unspecified angina pectoris: Secondary | ICD-10-CM | POA: Diagnosis not present

## 2021-07-06 DIAGNOSIS — I10 Essential (primary) hypertension: Secondary | ICD-10-CM

## 2021-07-06 MED ORDER — VALSARTAN 80 MG PO TABS: 80.0000 mg | ORAL_TABLET | Freq: Every day | ORAL | 3 refills | Status: DC

## 2021-07-06 NOTE — Patient Instructions (Signed)
Medication Instructions:  Your physician has recommended you make the following change in your medication:   Start Valsartan 80 mg daily. Use over the counter Melatonin.  *If you need a refill on your cardiac medications before your next appointment, please call your pharmacy*   Lab Work: None ordered If you have labs (blood work) drawn today and your tests are completely normal, you will receive your results only by: [place] (if you have MyChart) OR A paper copy in the mail If you have any lab test that is abnormal or we need to change your treatment, we will call you to review the results.   Testing/Procedures: None ordered   Follow-Up: At [facility], you and your health needs are our priority.  As part of our continuing mission to provide you with exceptional heart care, we have created designated Provider Care Teams.  These Care Teams include your primary Cardiologist (physician) and Advanced Practice Providers (APPs -  Physician Assistants and Nurse Practitioners) who all work together to provide you with the care you need, when you need it.  We recommend signing up for the patient portal called "MyChart".  Sign up information is provided on this After Visit Summary.  MyChart is used to connect with patients for Virtual Visits (Telemedicine).  Patients are able to view lab/test results, encounter notes, upcoming appointments, etc.  Non-urgent messages can be sent to your provider as well.   To learn more about what you can do with MyChart, go to [email or web address].    Your next appointment:   3 month(s)  The format for your next appointment:   In Person  Provider:   [name], MD   Other Instructions NA

## 2021-07-20 ENCOUNTER — Encounter: Payer: Self-pay | Admitting: Hematology and Oncology

## 2021-07-20 ENCOUNTER — Inpatient Hospital Stay: Payer: Medicare Other | Attending: Oncology

## 2021-07-20 ENCOUNTER — Other Ambulatory Visit: Payer: Self-pay

## 2021-07-20 DIAGNOSIS — D631 Anemia in chronic kidney disease: Secondary | ICD-10-CM | POA: Diagnosis not present

## 2021-07-20 DIAGNOSIS — D649 Anemia, unspecified: Secondary | ICD-10-CM | POA: Diagnosis not present

## 2021-07-20 DIAGNOSIS — N189 Chronic kidney disease, unspecified: Secondary | ICD-10-CM | POA: Diagnosis not present

## 2021-07-20 LAB — CBC AND DIFFERENTIAL
HCT: 31 — AB (ref 36–46)
Hemoglobin: 10.4 — AB (ref 12.0–16.0)
Neutrophils Absolute: 5.1
Platelets: 193 (ref 150–399)
WBC: 8.1

## 2021-07-20 LAB — CBC: RBC: 3.43 — AB (ref 3.87–5.11)

## 2021-07-24 DIAGNOSIS — E785 Hyperlipidemia, unspecified: Secondary | ICD-10-CM | POA: Diagnosis not present

## 2021-07-24 DIAGNOSIS — I10 Essential (primary) hypertension: Secondary | ICD-10-CM | POA: Diagnosis not present

## 2021-07-24 DIAGNOSIS — G4700 Insomnia, unspecified: Secondary | ICD-10-CM | POA: Diagnosis not present

## 2021-08-19 DIAGNOSIS — H2703 Aphakia, bilateral: Secondary | ICD-10-CM | POA: Diagnosis not present

## 2021-08-26 DIAGNOSIS — Z1231 Encounter for screening mammogram for malignant neoplasm of breast: Secondary | ICD-10-CM | POA: Diagnosis not present

## 2021-09-13 NOTE — Progress Notes (Signed)
[facility]  [address] [place],  [place]  [address] [phone]  Clinic Day:  09/20/2021  Referring physician: Ronita Hipps, MD  This document serves as a record of services personally performed by Dequincy Macarthur Critchley, MD. It was created on their behalf by [facility], a trained medical scribe. The creation of this record is based on the scribe's personal observations and the provider's statements to them.  HISTORY OF PRESENT ILLNESS:  The patient is a 75 y.o. female with anemia secondary to chronic renal insufficiency.  She receives red cell shot therapy intermittently, which has been effective in keeping her hemoglobin at or above 10.  She comes in today to reassess her anemia.  Since her last visit, the patient has been doing okay.   She denies having increased fatigue or any overt forms of blood loss which concern her for progressive anemia.    PHYSICAL EXAM:  Blood pressure (!) 163/74, pulse 67, temperature 98.1 F (36.7 C), resp. rate 14, height 5\' 3"  (1.6 m), weight 147 lb 8 oz (66.9 kg), SpO2 98 %. Wt Readings from Last 3 Encounters:  09/20/21 147 lb 8 oz (66.9 kg)  07/06/21 138 lb (62.6 kg)  05/26/21 139 lb (63 kg)   Body mass index is 26.13 kg/m. Performance status (ECOG): 1 - Symptomatic but completely ambulatory Physical Exam Constitutional:      Appearance: Normal appearance. She is not ill-appearing.  HENT:     Mouth/Throat:     Mouth: Mucous membranes are moist.     Pharynx: Oropharynx is clear. No oropharyngeal exudate or posterior oropharyngeal erythema.  Cardiovascular:     Rate and Rhythm: Normal rate and regular rhythm.     Heart sounds: No murmur heard.   No friction rub. No gallop.  Pulmonary:     Effort: Pulmonary effort is normal. No respiratory distress.     Breath sounds: Normal breath sounds. No wheezing, rhonchi or rales.  Abdominal:     General: Bowel sounds are normal. There is no distension.      Palpations: Abdomen is soft. There is no mass.     Tenderness: There is no abdominal tenderness.  Musculoskeletal:        General: No swelling.     Right lower leg: No edema.     Left lower leg: No edema.  Lymphadenopathy:     Cervical: No cervical adenopathy.     Upper Body:     Right upper body: No supraclavicular or axillary adenopathy.     Left upper body: No supraclavicular or axillary adenopathy.     Lower Body: No right inguinal adenopathy. No left inguinal adenopathy.  Skin:    General: Skin is warm.     Coloration: Skin is not jaundiced.     Findings: No lesion or rash.  Neurological:     General: No focal deficit present.     Mental Status: She is alert and oriented to person, place, and time. Mental status is at baseline.     Cranial Nerves: Cranial nerves are intact.  Psychiatric:        Mood and Affect: Mood normal.        Behavior: Behavior normal.        Thought Content: Thought content normal.    LABS:   CBC Latest Ref Rng & Units 09/20/2021 07/20/2021 05/20/2021  WBC - 5.9 8.1 7.7  Hemoglobin 12.0 - 16.0 9.6(A) 10.4(A) 10.9(A)  Hematocrit 36 - 46 29(A)  31(A) 33(A)  Platelets 150 - 399 184 193 225   CMP Latest Ref Rng & Units 09/20/2021 03/31/2021 02/21/2021  Glucose 65 - 99 mg/dL - - -  BUN 4 - 21 21 31(A) 26(A)  Creatinine 0.5 - 1.1 1.2(A) 1.4(A) 1.0  Sodium 137 - 147 135(A) 138 133(A)  Potassium 3.4 - 5.3 4.0 2.8(A) 3.3(A)  Chloride 99 - 108 99 97(A) 94(A)  CO2 13 - 22 28(A) 28(A) 33(A)  Calcium 8.7 - 10.7 9.1 9.6 9.5  Total Protein 6.3 - 8.2 g/dL - 8.6(A) -  Total Bilirubin 0.0 - 1.2 mg/dL - - -  Alkaline Phos 25 - 125 74 75 87  AST 13 - 35 23 33 30  ALT 7 - 35 12 18 15     Ref. Range 09/20/2021 10:37  Iron Latest Ref Range: 28 - 170 ug/dL 62  UIBC Latest Units: ug/dL 271  TIBC Latest Ref Range: 250 - 450 ug/dL 333  Saturation Ratios Latest Ref Range: 10.4 - 31.8 % 19  Ferritin Latest Ref Range: 11 - 307 ng/mL 79   ASSESSMENT & PLAN:   Assessment/Plan:  A 75 y.o. female with anemia secondary to renal insufficiency.  As her hemoglobin has fallen below 10, I will arrange for her to receive Retacrit 20,000 units this week.  Moving forward, her CBC will be checked monthly to ensure her hemoglobin is trending in the right direction.  I will see her back in 3 months for repeat clinical assessment.  The patient understands all the plans discussed today and is in agreement with them.    I, Rita Ohara, am acting as scribe for Marice Potter, MD    I have reviewed this report as typed by the medical scribe, and it is complete and accurate.  Dequincy Macarthur Critchley, MD

## 2021-09-16 DIAGNOSIS — Z6824 Body mass index (BMI) 24.0-24.9, adult: Secondary | ICD-10-CM | POA: Diagnosis not present

## 2021-09-16 DIAGNOSIS — R197 Diarrhea, unspecified: Secondary | ICD-10-CM | POA: Diagnosis not present

## 2021-09-19 DIAGNOSIS — R197 Diarrhea, unspecified: Secondary | ICD-10-CM | POA: Diagnosis not present

## 2021-09-20 ENCOUNTER — Other Ambulatory Visit: Payer: Self-pay | Admitting: Hematology and Oncology

## 2021-09-20 ENCOUNTER — Other Ambulatory Visit: Payer: Self-pay

## 2021-09-20 ENCOUNTER — Inpatient Hospital Stay: Payer: Medicare Other | Attending: Oncology

## 2021-09-20 ENCOUNTER — Encounter: Payer: Self-pay | Admitting: Oncology

## 2021-09-20 ENCOUNTER — Telehealth: Payer: Self-pay | Admitting: Oncology

## 2021-09-20 ENCOUNTER — Inpatient Hospital Stay (INDEPENDENT_AMBULATORY_CARE_PROVIDER_SITE_OTHER): Payer: Medicare Other | Admitting: Oncology

## 2021-09-20 ENCOUNTER — Other Ambulatory Visit: Payer: Self-pay | Admitting: Oncology

## 2021-09-20 VITALS — BP 163/74 | HR 67 | Temp 98.1°F | Resp 14 | Ht 63.0 in | Wt 147.5 lb

## 2021-09-20 DIAGNOSIS — N189 Chronic kidney disease, unspecified: Secondary | ICD-10-CM | POA: Diagnosis not present

## 2021-09-20 DIAGNOSIS — D631 Anemia in chronic kidney disease: Secondary | ICD-10-CM

## 2021-09-20 LAB — HEPATIC FUNCTION PANEL
ALT: 12 (ref 7–35)
AST: 23 (ref 13–35)
Alkaline Phosphatase: 74 (ref 25–125)
Bilirubin, Total: 0.4

## 2021-09-20 LAB — BASIC METABOLIC PANEL
BUN: 21 (ref 4–21)
CO2: 28 — AB (ref 13–22)
Chloride: 99 (ref 99–108)
Creatinine: 1.2 — AB (ref 0.5–1.1)
Glucose: 137
Potassium: 4 (ref 3.4–5.3)
Sodium: 135 — AB (ref 137–147)

## 2021-09-20 LAB — IRON AND TIBC
Iron: 62 ug/dL (ref 28–170)
Saturation Ratios: 19 % (ref 10.4–31.8)
TIBC: 333 ug/dL (ref 250–450)
UIBC: 271 ug/dL

## 2021-09-20 LAB — CBC
MCV: 91 (ref 81–99)
RBC: 3.15 — AB (ref 3.87–5.11)

## 2021-09-20 LAB — CBC AND DIFFERENTIAL
HCT: 29 — AB (ref 36–46)
Hemoglobin: 9.6 — AB (ref 12.0–16.0)
Neutrophils Absolute: 3.78
Platelets: 184 (ref 150–399)
WBC: 5.9

## 2021-09-20 LAB — COMPREHENSIVE METABOLIC PANEL
Albumin: 4.3 (ref 3.5–5.0)
Calcium: 9.1 (ref 8.7–10.7)

## 2021-09-20 LAB — FERRITIN: Ferritin: 79 ng/mL (ref 11–307)

## 2021-09-20 NOTE — Telephone Encounter (Signed)
Per 9/27 LOS next appt scheduled and given to patient

## 2021-09-21 ENCOUNTER — Other Ambulatory Visit: Payer: Self-pay | Admitting: Pharmacist

## 2021-09-22 ENCOUNTER — Inpatient Hospital Stay: Payer: Medicare Other

## 2021-09-22 VITALS — BP 167/76 | HR 66 | Temp 97.7°F | Resp 18 | Ht 63.0 in | Wt 147.0 lb

## 2021-09-22 DIAGNOSIS — D631 Anemia in chronic kidney disease: Secondary | ICD-10-CM

## 2021-09-22 DIAGNOSIS — N189 Chronic kidney disease, unspecified: Secondary | ICD-10-CM | POA: Diagnosis not present

## 2021-09-22 DIAGNOSIS — N183 Chronic kidney disease, stage 3 unspecified: Secondary | ICD-10-CM

## 2021-09-22 MED ORDER — EPOETIN ALFA-EPBX 10000 UNIT/ML IJ SOLN
10000.0000 [IU] | Freq: Once | INTRAMUSCULAR | Status: AC
  Administered 2021-09-22: 10000 [IU] via SUBCUTANEOUS
  Filled 2021-09-22: qty 1

## 2021-09-22 NOTE — Patient Instructions (Signed)
Epoetin Alfa injection What is this medication? EPOETIN ALFA (e POE e tin AL fa) helps your body make more red blood cells. This medicine is used to treat anemia caused by chronic kidney disease, cancer chemotherapy, or HIV-therapy. It may also be used before surgery if you have anemia. This medicine may be used for other purposes; ask your health care provider or pharmacist if you have questions. COMMON BRAND NAME(S): Epogen, Procrit, Retacrit What should I tell my care team before I take this medication? They need to know if you have any of these conditions: cancer heart disease high blood pressure history of blood clots history of stroke low levels of folate, iron, or vitamin B12 in the blood seizures an unusual or allergic reaction to erythropoietin, albumin, benzyl alcohol, hamster proteins, other medicines, foods, dyes, or preservatives pregnant or trying to get pregnant breast-feeding How should I use this medication? This medicine is for injection into a vein or under the skin. It is usually given by a health care professional in a hospital or clinic setting. If you get this medicine at home, you will be taught how to prepare and give this medicine. Use exactly as directed. Take your medicine at regular intervals. Do not take your medicine more often than directed. It is important that you put your used needles and syringes in a special sharps container. Do not put them in a trash can. If you do not have a sharps container, call your pharmacist or healthcare provider to get one. A special MedGuide will be given to you by the pharmacist with each prescription and refill. Be sure to read this information carefully each time. Talk to your pediatrician regarding the use of this medicine in children. While this drug may be prescribed for selected conditions, precautions do apply. Overdosage: If you think you have taken too much of this medicine contact a poison control center or emergency  room at once. NOTE: This medicine is only for you. Do not share this medicine with others. What if I miss a dose? If you miss a dose, take it as soon as you can. If it is almost time for your next dose, take only that dose. Do not take double or extra doses. What may interact with this medication? Interactions have not been studied. This list may not describe all possible interactions. Give your health care provider a list of all the medicines, herbs, non-prescription drugs, or dietary supplements you use. Also tell them if you smoke, drink alcohol, or use illegal drugs. Some items may interact with your medicine. What should I watch for while using this medication? Your condition will be monitored carefully while you are receiving this medicine. You may need blood work done while you are taking this medicine. This medicine may cause a decrease in vitamin B6. You should make sure that you get enough vitamin B6 while you are taking this medicine. Discuss the foods you eat and the vitamins you take with your health care professional. What side effects may I notice from receiving this medication? Side effects that you should report to your doctor or health care professional as soon as possible: allergic reactions like skin rash, itching or hives, swelling of the face, lips, or tongue seizures signs and symptoms of a blood clot such as breathing problems; changes in vision; chest pain; severe, sudden headache; pain, swelling, warmth in the leg; trouble speaking; sudden numbness or weakness of the face, arm or leg signs and symptoms of a stroke like   changes in vision; confusion; trouble speaking or understanding; severe headaches; sudden numbness or weakness of the face, arm or leg; trouble walking; dizziness; loss of balance or coordination Side effects that usually do not require medical attention (report to your doctor or health care professional if they continue or are  bothersome): chills cough dizziness fever headaches joint pain muscle cramps muscle pain nausea, vomiting pain, redness, or irritation at site where injected This list may not describe all possible side effects. Call your doctor for medical advice about side effects. You may report side effects to FDA at 1-800-FDA-1088. Where should I keep my medication? Keep out of the reach of children. Store in a refrigerator between 2 and 8 degrees C (36 and 46 degrees F). Do not freeze or shake. Throw away any unused portion if using a single-dose vial. Multi-dose vials can be kept in the refrigerator for up to 21 days after the initial dose. Throw away unused medicine. NOTE: This sheet is a summary. It may not cover all possible information. If you have questions about this medicine, talk to your doctor, pharmacist, or health care provider.  2022 Elsevier/Gold Standard (2017-07-20 08:35:19)  

## 2021-09-23 DIAGNOSIS — E1129 Type 2 diabetes mellitus with other diabetic kidney complication: Secondary | ICD-10-CM | POA: Diagnosis not present

## 2021-09-23 DIAGNOSIS — I480 Paroxysmal atrial fibrillation: Secondary | ICD-10-CM | POA: Diagnosis not present

## 2021-09-23 DIAGNOSIS — I5022 Chronic systolic (congestive) heart failure: Secondary | ICD-10-CM | POA: Diagnosis not present

## 2021-10-06 ENCOUNTER — Encounter: Payer: Self-pay | Admitting: Gastroenterology

## 2021-10-09 NOTE — Progress Notes (Signed)
Cardiology Office Note:    Date:  10/10/2021   ID:  Angela Mccoy, DOB 03-11-1946, MRN [id number]  PCP:  Ronita Hipps, MD  Cardiologist:  [name], MD    Referring MD: Ronita Hipps, MD    ASSESSMENT:    1. Coronary artery disease involving native coronary artery of native heart with angina pectoris ([place])   2. Essential hypertension   3. Hyperlipidemia, unspecified hyperlipidemia type   4. Iron deficiency anemia, unspecified iron deficiency anemia type    PLAN:    In order of problems listed above:  She continues to have frequent angina but pattern is becoming unstable and I think she should undergo both coronary angiography options benefits risks detailed and she agrees continue medical therapy including clopidogrel beta-blocker statin oral nitrate and will add nighttime Nitropaste.  At this time I do not think she requires admission to the hospital for acute coronary syndrome. Stable continue current treatment Continue her statin we will check a lipid profile with her precatheterization laboratories Also recheck a CBC prior to coronary angiography   Next appointment: 8 weeks   Medication Adjustments/Labs and Tests Ordered: Current medicines are reviewed at length with the patient today.  Concerns regarding medicines are outlined above.  No orders of the defined types were placed in this encounter.  No orders of the defined types were placed in this encounter.   Chief Complaint  Patient presents with   Follow-up   Coronary Artery Disease    History of Present Illness:    Angela Mccoy is a 75 y.o. female with a hx of CAD with remote PCI and drug-eluting stent to the LAD and right coronary artery in 2003 dyslipidemia hypertension chronic anemia chronic lower extremity edema last seen 07/06/2021.  She underwent a myocardial perfusion study 05/26/2001 showing normal perfusion EF 67% no ischemic ST changes low risk.  She has anemia of  chronic renal disease and has been receiving erythropoietin and Dr. Beckie Salts hematology oncology.  Compliance with diet, lifestyle and medications: Yes  She does not feel well she has been having diarrhea and abdominal pain and scheduled to see GI next month. Unfortunate she is having frequent angina she is taking nitroglycerin she estimates 12 times in the last month and predominantly occurs at nighttime and awakens her from her sleep. Because of her GI problems she has been continent and active she no longer goes to the gym is not having angina with daily activities She describes substernal pressure aching no radiation no shortness of breath awakens her from her sleep relieved with nitroglycerin does not radiate into the back or neck. She is also had back pain recently and took a short course of nonsteroidal over-the-counter anti-inflammatory drug With her known history of CAD continued angina pattern that is becoming unstable and good medical therapy I think she should undergo coronary angiography benefits risks and options detailed she agrees Past Medical History:  Diagnosis Date   Anemia 07/31/2017   Anemia in chronic kidney disease 10/03/2020   Carotid bruit 02/26/2016   Carotid bruit present 02/26/2016   Cavitary pneumonia 03/20/2019   Onset of symptoms around late  Feb 2020 > all symptoms resolved p 5 days Meropenem and rx x 2 weeks then changed to cleocin -  D/c cleocin 03/20/2019  With esr 12, wbc 7,800  -  Quant TB 03/20/2019  Neg  - 05/14/2019 cxr with minimal streaky residual, no as dz    Coronary artery  disease involving native coronary artery 02/26/2016   Coronary artery disease involving native coronary artery of native heart with angina pectoris ([place]) 03/12/2016   Overview:  PCI and DES to LAD and RCA in 2003, cath 2010 wo restenosis   Essential hypertension 03/12/2016   High cholesterol 01/17/2017   Hx of diverticulitis of colon 09/11/2016   Hyperlipidemia 03/12/2016    Hypertension 01/27/2016   Insomnia 02/26/2016   Migraine with aura 02/26/2016   Mitral valve prolapse 02/26/2016   Peripheral vascular disease ([place]) 02/26/2016   Persistent atrial fibrillation ([place]) 11/06/2019   Pneumonia of right upper lobe due to infectious organism 02/26/2019   Pre-op evaluation 11/19/2019   Rheumatoid arthritis involving multiple joints ([place]) 02/26/2016   Rheumatoid arthritis involving multiple sites ([place]) 02/26/2016    Past Surgical History:  Procedure Laterality Date   ABDOMINAL HYSTERECTOMY     CHOLECYSTECTOMY      Current Medications: Current Meds  Medication Sig   carvedilol (COREG) 6.25 MG tablet TAKE 1 TABLET(6.25 MG) BY MOUTH TWICE DAILY WITH A MEAL (Patient taking differently: Take 6.25 mg by mouth 2 (two) times daily with a meal.)   chlorthalidone (HYGROTON) 25 MG tablet Take 25 mg by mouth daily.   citalopram (CELEXA) 40 MG tablet Take 40 mg by mouth daily.   clopidogrel (PLAVIX) 75 MG tablet TAKE 1 TABLET(75 MG) BY MOUTH DAILY (Patient taking differently: Take 75 mg by mouth daily.)   isosorbide mononitrate (IMDUR) 30 MG 24 hr tablet Take 30 mg by mouth daily.   nitroGLYCERIN (NITROSTAT) 0.4 MG SL tablet ONE TABLET UNDER TONGUE AS NEEDED FOR CHEST PAIN EVERY 5 MINUTES (Patient taking differently: Place 0.4 mg under the tongue every 5 (five) minutes as needed for chest pain. ONE TABLET UNDER TONGUE AS NEEDED FOR CHEST PAIN EVERY 5 MINUTES)   pantoprazole (PROTONIX) 40 MG tablet TAKE 1 TABLET(40 MG) BY MOUTH DAILY (Patient taking differently: Take 40 mg by mouth daily.)   potassium chloride SA (KLOR-CON) 20 MEQ tablet Take 1 tablet (20 mEq total) by mouth 2 (two) times daily. (Patient taking differently: Take 20 mEq by mouth daily.)   pravastatin (PRAVACHOL) 40 MG tablet TAKE 1 TABLET(40 MG) BY MOUTH DAILY (Patient taking differently: Take 40 mg by mouth daily.)   QUEtiapine (SEROQUEL) 200 MG tablet Take 200 mg by mouth daily.    valsartan (DIOVAN) 80 MG tablet Take 1  tablet (80 mg total) by mouth daily.     Allergies:   Penicillins   Social History   Socioeconomic History   Marital status: Married    Spouse name: Not on file   Number of children: Not on file   Years of education: Not on file   Highest education level: Not on file  Occupational History   Not on file  Tobacco Use   Smoking status: Former    Packs/day: 1.00    Years: 18.00    Pack years: 18.00    Types: Cigarettes    Quit date: 12/26/1987    Years since quitting: 33.8   Smokeless tobacco: Never  Vaping Use   Vaping Use: Never used  Substance and Sexual Activity   Alcohol use: No   Drug use: Never   Sexual activity: Not on file  Other Topics Concern   Not on file  Social History Narrative   Not on file   Social Determinants of Health   Financial Resource Strain: Not on file  Food Insecurity: Not on file  Transportation Needs: Not on  file  Physical Activity: Not on file  Stress: Not on file  Social Connections: Not on file     Family History: The patient's family history includes Breast cancer in her sister; Heart attack in her brother, mother, and sister; Lung cancer in her brother. There is no history of Asthma. ROS:   Please see the history of present illness.    All other systems reviewed and are negative.  EKGs/Labs/Other Studies Reviewed:    The following studies were reviewed today:  EKG:  EKG ordered today and personally reviewed.  The ekg ordered today demonstrates sinus rhythm minor nonspecific ST and T abnormality  Recent Labs: 09/20/2021: ALT 12; BUN 21; Creatinine 1.2; Hemoglobin 9.6; Platelets 184; Potassium 4.0; Sodium 135  Recent Lipid Panel    Component Value Date/Time   CHOL 135 10/11/2020 0000   CHOL 137 11/06/2019 1122   TRIG 294 (A) 10/11/2020 0000   HDL 33 (A) 10/11/2020 0000   HDL 46 11/06/2019 1122   CHOLHDL 3.0 11/06/2019 1122   LDLCALC 43 10/11/2020 0000   LDLCALC 55 11/06/2019 1122    Physical Exam:    VS:  BP 134/60 (BP  Location: Right Arm, Patient Position: Sitting)   Pulse 89   Ht 5' 3"  (1.6 m)   Wt 144 lb 3.2 oz (65.4 kg)   SpO2 94%   BMI 25.54 kg/m     Wt Readings from Last 3 Encounters:  10/10/21 144 lb 3.2 oz (65.4 kg)  09/22/21 147 lb (66.7 kg)  09/20/21 147 lb 8 oz (66.9 kg)     GEN:  Well nourished, well developed in no acute distress HEENT: Normal NECK: No JVD; No carotid bruits LYMPHATICS: No lymphadenopathy CARDIAC: RRR, no murmurs, rubs, gallops RESPIRATORY:  Clear to auscultation without rales, wheezing or rhonchi  ABDOMEN: Soft, non-tender, non-distended MUSCULOSKELETAL:  No edema; No deformity  SKIN: Warm and dry NEUROLOGIC:  Alert and oriented x 3 PSYCHIATRIC:  Normal affect    Signed, [name], MD  10/10/2021 9:04 AM    [place]

## 2021-10-09 NOTE — H&P (View-Only) (Signed)
Cardiology Office Note:    Date:  10/10/2021   ID:  Angela Mccoy, DOB [birth year], MRN [id number]  PCP:  Angela Hipps, MD  Cardiologist:  Angela More, MD    Referring MD: Angela Hipps, MD    ASSESSMENT:    1. Coronary artery disease involving native coronary artery of native heart with angina pectoris ([place])   2. Essential hypertension   3. Hyperlipidemia, unspecified hyperlipidemia type   4. Iron deficiency anemia, unspecified iron deficiency anemia type    PLAN:    In order of problems listed above:  She continues to have frequent angina but pattern is becoming unstable and I think she should undergo both coronary angiography options benefits risks detailed and she agrees continue medical therapy including clopidogrel beta-blocker statin oral nitrate and will add nighttime Nitropaste.  At this time I do not think she requires admission to the hospital for acute coronary syndrome. Stable continue current treatment Continue her statin we will check a lipid profile with her precatheterization laboratories Also recheck a CBC prior to coronary angiography   Next appointment: 8 weeks   Medication Adjustments/Labs and Tests Ordered: Current medicines are reviewed at length with the patient today.  Concerns regarding medicines are outlined above.  No orders of the defined types were placed in this encounter.  No orders of the defined types were placed in this encounter.   Chief Complaint  Patient presents with   Follow-up   Coronary Artery Disease    History of Present Illness:    Angela Mccoy is a 75 y.o. female with a hx of CAD with remote PCI and drug-eluting stent to the LAD and right coronary artery in 2003 dyslipidemia hypertension chronic anemia chronic lower extremity edema last seen 07/06/2021.  She underwent a myocardial perfusion study 05/26/2001 showing normal perfusion EF 67% no ischemic ST changes low risk.  She has anemia of  chronic renal disease and has been receiving erythropoietin and Dr. Beckie Salts hematology oncology.  Compliance with diet, lifestyle and medications: Yes  She does not feel well she has been having diarrhea and abdominal pain and scheduled to see GI next month. Unfortunate she is having frequent angina she is taking nitroglycerin she estimates 12 times in the last month and predominantly occurs at nighttime and awakens her from her sleep. Because of her GI problems she has been continent and active she no longer goes to the gym is not having angina with daily activities She describes substernal pressure aching no radiation no shortness of breath awakens her from her sleep relieved with nitroglycerin does not radiate into the back or neck. She is also had back pain recently and took a short course of nonsteroidal over-the-counter anti-inflammatory drug With her known history of CAD continued angina pattern that is becoming unstable and good medical therapy I think she should undergo coronary angiography benefits risks and options detailed she agrees Past Medical History:  Diagnosis Date   Anemia 07/31/2017   Anemia in chronic kidney disease 10/03/2020   Carotid bruit 02/26/2016   Carotid bruit present 02/26/2016   Cavitary pneumonia 03/20/2019   Onset of symptoms around late  Feb 2020 > all symptoms resolved p 5 days Meropenem and rx x 2 weeks then changed to cleocin -  D/c cleocin 03/20/2019  With esr 12, wbc 7,800  -  Quant TB 03/20/2019  Neg  - 05/14/2019 cxr with minimal streaky residual, no as dz    Coronary artery  disease involving native coronary artery 02/26/2016   Coronary artery disease involving native coronary artery of native heart with angina pectoris ([place]) 03/12/2016   Overview:  PCI and DES to LAD and RCA in 2003, cath 2010 wo restenosis   Essential hypertension 03/12/2016   High cholesterol 01/17/2017   Hx of diverticulitis of colon 09/11/2016   Hyperlipidemia 03/12/2016    Hypertension 01/27/2016   Insomnia 02/26/2016   Migraine with aura 02/26/2016   Mitral valve prolapse 02/26/2016   Peripheral vascular disease ([place]) 02/26/2016   Persistent atrial fibrillation ([place]) 11/06/2019   Pneumonia of right upper lobe due to infectious organism 02/26/2019   Pre-op evaluation 11/19/2019   Rheumatoid arthritis involving multiple joints ([place]) 02/26/2016   Rheumatoid arthritis involving multiple sites ([place]) 02/26/2016    Past Surgical History:  Procedure Laterality Date   ABDOMINAL HYSTERECTOMY     CHOLECYSTECTOMY      Current Medications: Current Meds  Medication Sig   carvedilol (COREG) 6.25 MG tablet TAKE 1 TABLET(6.25 MG) BY MOUTH TWICE DAILY WITH A MEAL (Patient taking differently: Take 6.25 mg by mouth 2 (two) times daily with a meal.)   chlorthalidone (HYGROTON) 25 MG tablet Take 25 mg by mouth daily.   citalopram (CELEXA) 40 MG tablet Take 40 mg by mouth daily.   clopidogrel (PLAVIX) 75 MG tablet TAKE 1 TABLET(75 MG) BY MOUTH DAILY (Patient taking differently: Take 75 mg by mouth daily.)   isosorbide mononitrate (IMDUR) 30 MG 24 hr tablet Take 30 mg by mouth daily.   nitroGLYCERIN (NITROSTAT) 0.4 MG SL tablet ONE TABLET UNDER TONGUE AS NEEDED FOR CHEST PAIN EVERY 5 MINUTES (Patient taking differently: Place 0.4 mg under the tongue every 5 (five) minutes as needed for chest pain. ONE TABLET UNDER TONGUE AS NEEDED FOR CHEST PAIN EVERY 5 MINUTES)   pantoprazole (PROTONIX) 40 MG tablet TAKE 1 TABLET(40 MG) BY MOUTH DAILY (Patient taking differently: Take 40 mg by mouth daily.)   potassium chloride SA (KLOR-CON) 20 MEQ tablet Take 1 tablet (20 mEq total) by mouth 2 (two) times daily. (Patient taking differently: Take 20 mEq by mouth daily.)   pravastatin (PRAVACHOL) 40 MG tablet TAKE 1 TABLET(40 MG) BY MOUTH DAILY (Patient taking differently: Take 40 mg by mouth daily.)   QUEtiapine (SEROQUEL) 200 MG tablet Take 200 mg by mouth daily.    valsartan (DIOVAN) 80 MG tablet Take 1  tablet (80 mg total) by mouth daily.     Allergies:   Penicillins   Social History   Socioeconomic History   Marital status: Married    Spouse name: Not on file   Number of children: Not on file   Years of education: Not on file   Highest education level: Not on file  Occupational History   Not on file  Tobacco Use   Smoking status: Former    Packs/day: 1.00    Years: 18.00    Pack years: 18.00    Types: Cigarettes    Quit date: 12/26/1987    Years since quitting: 33.8   Smokeless tobacco: Never  Vaping Use   Vaping Use: Never used  Substance and Sexual Activity   Alcohol use: No   Drug use: Never   Sexual activity: Not on file  Other Topics Concern   Not on file  Social History Narrative   Not on file   Social Determinants of Health   Financial Resource Strain: Not on file  Food Insecurity: Not on file  Transportation Needs: Not on  file  Physical Activity: Not on file  Stress: Not on file  Social Connections: Not on file     Family History: The patient's family history includes Breast cancer in her sister; Heart attack in her brother, mother, and sister; Lung cancer in her brother. There is no history of Asthma. ROS:   Please see the history of present illness.    All other systems reviewed and are negative.  EKGs/Labs/Other Studies Reviewed:    The following studies were reviewed today:  EKG:  EKG ordered today and personally reviewed.  The ekg ordered today demonstrates sinus rhythm minor nonspecific ST and T abnormality  Recent Labs: 09/20/2021: ALT 12; BUN 21; Creatinine 1.2; Hemoglobin 9.6; Platelets 184; Potassium 4.0; Sodium 135  Recent Lipid Panel    Component Value Date/Time   CHOL 135 10/11/2020 0000   CHOL 137 11/06/2019 1122   TRIG 294 (A) 10/11/2020 0000   HDL 33 (A) 10/11/2020 0000   HDL 46 11/06/2019 1122   CHOLHDL 3.0 11/06/2019 1122   LDLCALC 43 10/11/2020 0000   LDLCALC 55 11/06/2019 1122    Physical Exam:    VS:  BP 134/60 (BP  Location: Right Arm, Patient Position: Sitting)   Pulse 89   Ht 5' 3"  (1.6 m)   Wt 144 lb 3.2 oz (65.4 kg)   SpO2 94%   BMI 25.54 kg/m     Wt Readings from Last 3 Encounters:  10/10/21 144 lb 3.2 oz (65.4 kg)  09/22/21 147 lb (66.7 kg)  09/20/21 147 lb 8 oz (66.9 kg)     GEN:  Well nourished, well developed in no acute distress HEENT: Normal NECK: No JVD; No carotid bruits LYMPHATICS: No lymphadenopathy CARDIAC: RRR, no murmurs, rubs, gallops RESPIRATORY:  Clear to auscultation without rales, wheezing or rhonchi  ABDOMEN: Soft, non-tender, non-distended MUSCULOSKELETAL:  No edema; No deformity  SKIN: Warm and dry NEUROLOGIC:  Alert and oriented x 3 PSYCHIATRIC:  Normal affect    Signed, Angela More, MD  10/10/2021 9:04 AM    [place]

## 2021-10-10 ENCOUNTER — Other Ambulatory Visit: Payer: Self-pay

## 2021-10-10 ENCOUNTER — Encounter: Payer: Self-pay | Admitting: Cardiology

## 2021-10-10 ENCOUNTER — Ambulatory Visit (INDEPENDENT_AMBULATORY_CARE_PROVIDER_SITE_OTHER): Payer: Medicare Other | Admitting: Cardiology

## 2021-10-10 VITALS — BP 134/60 | HR 89 | Ht 63.0 in | Wt 144.2 lb

## 2021-10-10 DIAGNOSIS — I10 Essential (primary) hypertension: Secondary | ICD-10-CM

## 2021-10-10 DIAGNOSIS — D509 Iron deficiency anemia, unspecified: Secondary | ICD-10-CM | POA: Diagnosis not present

## 2021-10-10 DIAGNOSIS — E785 Hyperlipidemia, unspecified: Secondary | ICD-10-CM

## 2021-10-10 DIAGNOSIS — I25119 Atherosclerotic heart disease of native coronary artery with unspecified angina pectoris: Secondary | ICD-10-CM

## 2021-10-10 MED ORDER — NITROGLYCERIN 0.4 % RE OINT: 1.0000 [in_us] | TOPICAL_OINTMENT | Freq: Every evening | RECTAL | 2 refills | Status: DC

## 2021-10-10 NOTE — Patient Instructions (Signed)
Medication Instructions:  Your physician has recommended you make the following change in your medication:   Use nitroglycerin paste 1 inch every night.  *If you need a refill on your cardiac medications before your next appointment, please call your pharmacy*   Lab Work: Your physician recommends that you have a BMET, lipids and CBC today in the office for your upcoming procedure.  If you have labs (blood work) drawn today and your tests are completely normal, you will receive your results only by: [place] (if you have MyChart) OR A paper copy in the mail If you have any lab test that is abnormal or we need to change your treatment, we will call you to review the results.   Testing/Procedures:  [place] [id number] Dept: [phone] Loc: [phone]  Angela Mccoy  10/10/2021  You are scheduled for a Cardiac Catheterization on Tuesday, October 25 with Dr. [name].  1. Please arrive at the [facility] (Main Entrance A) at [facility]: [address] [place], [place] [address] at 8:30 AM (This time is two hours before your procedure to ensure your preparation). Free valet parking service is available.   Special note: Every effort is made to have your procedure done on time. Please understand that emergencies sometimes delay scheduled procedures.  2. Diet: Do not eat solid foods after midnight.  The patient may have clear liquids until 5am upon the day of the procedure.  3. Labs: You had your labs done today in the office.  4. Medication instructions in preparation for your procedure:   Contrast Allergy: No  Stop taking, Diovan (Valsartan) Tuesday, October 25,  On the morning of your procedure, take your Aspirin /Clopidogrel (Plavix) and any morning medicines NOT listed above.  You may use sips of water.  5. Plan for one night  stay--bring personal belongings. 6. Bring a current list of your medications and current insurance cards. 7. You MUST have a responsible person to drive you home. 8. Someone MUST be with you the first 24 hours after you arrive home or your discharge will be delayed. 9. Please wear clothes that are easy to get on and off and wear slip-on shoes.  Thank you for allowing [place] to care for you!   -- [facility]  Follow-Up: At [facility], you and your health needs are our priority.  As part of our continuing mission to provide you with exceptional heart care, we have created designated Provider Care Teams.  These Care Teams include your primary Cardiologist (physician) and Advanced Practice Providers (APPs -  Physician Assistants and Nurse Practitioners) who all work together to provide you with the care you need, when you need it.  We recommend signing up for the patient portal called "MyChart".  Sign up information is provided on this After Visit Summary.  MyChart is used to connect with patients for Virtual Visits (Telemedicine).  Patients are able to view lab/test results, encounter notes, upcoming appointments, etc.  Non-urgent messages can be sent to your provider as well.   To learn more about what you can do with MyChart, go to [email or web address].    Your next appointment:   8 week(s)  The format for your next appointment:   In Person  Provider:   [name], MD   Other Instructions  Coronary Angiogram With Stent Coronary angiogram with stent placement is a procedure  to widen or open a narrow blood vessel of the heart (coronary artery). Arteries may become blocked by cholesterol buildup (plaques) in the lining of the artery wall. When a coronary artery becomes partially blocked, blood flow to that area decreases. This may lead to chest pain or a heart attack (myocardial infarction). A stent is a small piece of metal that looks like mesh or  spring. Stent placement may be done as treatment after a heart attack, or to prevent a heart attack if a blocked artery is found by a coronary angiogram. Let your health care provider know about: Any allergies you have, including allergies to medicines or contrast dye. All medicines you are taking, including vitamins, herbs, eye drops, creams, and over-the-counter medicines. Any problems you or family members have had with anesthetic medicines. Any blood disorders you have. Any surgeries you have had. Any medical conditions you have, including kidney problems or kidney failure. Whether you are pregnant or may be pregnant. Whether you are breastfeeding. What are the risks? Generally, this is a safe procedure. However, serious problems may occur, including: Damage to nearby structures or organs, such as the heart, blood vessels, or kidneys. A return of blockage. Bleeding, infection, or bruising at the insertion site. A collection of blood under the skin (hematoma) at the insertion site. A blood clot in another part of the body. Allergic reaction to medicines or dyes. Bleeding into the abdomen (retroperitoneal bleeding). Stroke (rare). Heart attack (rare). What happens before the procedure? Staying hydrated Follow instructions from your health care provider about hydration, which may include: Up to 2 hours before the procedure - you may continue to drink clear liquids, such as water, clear fruit juice, black coffee, and plain tea.    Eating and drinking restrictions Follow instructions from your health care provider about eating and drinking, which may include: 8 hours before the procedure - stop eating heavy meals or foods, such as meat, fried foods, or fatty foods. 6 hours before the procedure - stop eating light meals or foods, such as toast or cereal. 2 hours before the procedure - stop drinking clear liquids. Medicines Ask your health care provider about: Changing or stopping your  regular medicines. This is especially important if you are taking diabetes medicines or blood thinners. Taking medicines such as aspirin and ibuprofen. These medicines can thin your blood. Do not take these medicines unless your health care provider tells you to take them. Generally, aspirin is recommended before a thin tube, called a catheter, is passed through a blood vessel and inserted into the heart (cardiac catheterization). Taking over-the-counter medicines, vitamins, herbs, and supplements. General instructions Do not use any products that contain nicotine or tobacco for at least 4 weeks before the procedure. These products include cigarettes, e-cigarettes, and chewing tobacco. If you need help quitting, ask your health care provider. Plan to have someone take you home from the hospital or clinic. If you will be going home right after the procedure, plan to have someone with you for 24 hours. You may have tests and imaging procedures. Ask your health care provider: How your insertion site will be marked. Ask which artery will be used for the procedure. What steps will be taken to help prevent infection. These may include: Removing hair at the insertion site. Washing skin with a germ-killing soap. Taking antibiotic medicine. What happens during the procedure? An IV will be inserted into one of your veins. Electrodes may be placed on your chest to monitor your heart  rate during the procedure. You will be given one or more of the following: A medicine to help you relax (sedative). A medicine to numb the area (local anesthetic) for catheter insertion. A small incision will be made for catheter insertion. The catheter will be inserted into an artery using a guide wire. The location may be in your groin, your wrist, or the fold of your arm (near your elbow). An X-ray procedure (fluoroscopy) will be used to help guide the catheter to the opening of the heart arteries. A dye will be injected  into the catheter. X-rays will be taken. The dye helps to show where any narrowing or blockages are located in the arteries. Tell your health care provider if you have chest pain or trouble breathing. A tiny wire will be guided to the blocked spot, and a balloon will be inflated to make the artery wider. The stent will be expanded to crush the plaques into the wall of the vessel. The stent will hold the area open and improve the blood flow. Most stents have a drug coating to reduce the risk of the stent narrowing over time. The artery may be made wider using a drill, laser, or other tools that remove plaques. The catheter will be removed when the blood flow improves. The stent will stay where it was placed, and the lining of the artery will grow over it. A bandage (dressing) will be placed on the insertion site. Pressure will be applied to stop bleeding. The IV will be removed. This procedure may vary among health care providers and hospitals.    What happens after the procedure? Your blood pressure, heart rate, breathing rate, and blood oxygen level will be monitored until you leave the hospital or clinic. If the procedure is done through the leg, you will lie flat in bed for a few hours or for as long as told by your health care provider. You will be instructed not to bend or cross your legs. The insertion site and the pulse in your foot or wrist will be checked often. You may have more blood tests, X-rays, and a test that records the electrical activity of your heart (electrocardiogram, or ECG). Do not drive for 24 hours if you were given a sedative during your procedure. Summary Coronary angiogram with stent placement is a procedure to widen or open a narrowed coronary artery. This is done to treat heart problems. Before the procedure, let your health care provider know about all the medical conditions and surgeries you have or have had. This is a safe procedure. However, some problems may  occur, including damage to nearby structures or organs, bleeding, blood clots, or allergies. Follow your health care provider's instructions about eating, drinking, medicines, and other lifestyle changes, such as quitting tobacco use before the procedure. This information is not intended to replace advice given to you by your health care provider. Make sure you discuss any questions you have with your health care provider. Document Revised: 07/02/2019 Document Reviewed: 07/02/2019 Elsevier Patient Education  2021 [place].  Aspirin and Your Heart Aspirin is a medicine that prevents the platelets in your blood from sticking together. Platelets are the cells that your blood uses for clotting. Aspirin can be used to help reduce the risk of blood clots, heart attacks, and other heart-related problems. What are the risks? Daily use of aspirin can cause side effects. Some of these include: Bleeding. Bleeding can be minor or serious. An example of minor bleeding is bleeding  from a cut, and the bleeding does not stop. An example of more serious bleeding is stomach bleeding or, rarely, bleeding into the brain. Your risk of bleeding increases if you are also taking NSAIDs, such as ibuprofen. Increased bruising. Upset stomach. An allergic reaction. People who have growths inside the nose (nasal polyps) have an increased risk of developing an aspirin allergy. How to use aspirin to care for your heart Take aspirin only as told by your health care provider. Make sure that you understand how much to take and what form to take. The two forms of aspirin are: Non-enteric-coated.This type of aspirin does not have a coating and is absorbed quickly. This type of aspirin also comes in a chewable form. Enteric-coated. This type of aspirin has a coating that releases the medicine very slowly. Enteric-coated aspirin might cause less stomach upset than non-enteric-coated aspirin. This type of aspirin should not be  chewed or crushed. Work with your health care provider to find out whether it is safe and beneficial for you to take aspirin daily. Taking aspirin daily may be helpful if: You have had a heart attack or chest pain, or you are at risk for a heart attack. You have a condition in which certain heart vessels are blocked (coronary artery disease), and you have had a procedure to treat it. Examples are: Open-heart surgery, such as coronary artery bypass surgery (CABG). Coronary angioplasty,which is done to widen a blood vessel of your heart. Having a small mesh tube, or stent, placed in your coronary artery. You have had certain types of stroke or a mini-stroke known as a transient ischemic attack (TIA). You have a narrowing of the arteries that supply the limbs (peripheral artery disease, or PAD). You have long-term (chronic) heart rhythm problems, such as atrial fibrillation, and your health care provider thinks aspirin may help. You have valve disease or have had surgery on a valve. You are considered at increased risk of developing coronary artery disease or PAD.    Follow these instructions at home Medicines Take over-the-counter and prescription medicines only as told by your health care provider. If you are taking blood thinners: Talk with your health care provider before you take any medicines that contain aspirin or NSAIDs, such as ibuprofen. These medicines increase your risk for dangerous bleeding. Take your medicine exactly as told, at the same time every day. Avoid activities that could cause injury or bruising, and follow instructions about how to prevent falls. Wear a medical alert bracelet or carry a card that lists what medicines you take. General instructions Do not drink alcohol if: Your health care provider tells you not to drink. You are pregnant, may be pregnant, or are planning to become pregnant. If you drink alcohol: Limit how much you use to: 0-1 drink a day for  women. 0-2 drinks a day for men. Be aware of how much alcohol is in your drink. In the [place] one drink equals one 12 oz bottle of beer (355 mL), one 5 oz glass of wine (148 mL), or one 1 oz glass of hard liquor (44 mL). Keep all follow-up visits as told by your health care provider. This is important. Where to find more information The American Heart Association: www.heart.org Contact a health care provider if you have: Unusual bleeding or bruising. Stomach pain or nausea. Ringing in your ears. An allergic reaction that causes hives, itchy skin, or swelling of the lips, tongue, or face. Get help right away if: You notice that  your bowel movements are bloody, or dark red or black in color. You vomit or cough up blood. You have blood in your urine. You cough, breathe loudly (wheeze), or feel short of breath. You have chest pain, especially if the pain spreads to your arms, back, neck, or jaw. You have a headache with confusion. You have any symptoms of a stroke. "BE FAST" is an easy way to remember the main warning signs of a stroke: B - Balance. Signs are dizziness, sudden trouble walking, or loss of balance. E - Eyes. Signs are trouble seeing or a sudden change in vision. F - Face. Signs are sudden weakness or numbness of the face, or the face or eyelid drooping on one side. A - Arms. Signs are weakness or numbness in an arm. This happens suddenly and usually on one side of the body. S - Speech. Signs are sudden trouble speaking, slurred speech, or trouble understanding what people say. T - Time. Time to call emergency services. Write down what time symptoms started. You have other signs of a stroke, such as: A sudden, severe headache with no known cause. Nausea or vomiting. Seizure. These symptoms may represent a serious problem that is an emergency. Do not wait to see if the symptoms will go away. Get medical help right away. Call your local emergency services (911 in the [place] Do  not drive yourself to the hospital. Summary Aspirin use can help reduce the risk of blood clots, heart attacks, and other heart-related problems. Daily use of aspirin can cause side effects. Take aspirin only as told by your health care provider. Make sure that you understand how much to take and what form to take. Your health care provider will help you determine whether it is safe and beneficial for you to take aspirin daily. This information is not intended to replace advice given to you by your health care provider. Make sure you discuss any questions you have with your health care provider. Document Revised: 09/15/2019 Document Reviewed: 09/15/2019 Elsevier Patient Education  2021 [place]. Nitroglycerin sublingual tablets What is this medicine? NITROGLYCERIN (nye troe GLI ser in) is a type of vasodilator. It relaxes blood vessels, increasing the blood and oxygen supply to your heart. This medicine is used to relieve chest pain caused by angina. It is also used to prevent chest pain before activities like climbing stairs, going outdoors in cold weather, or sexual activity. This medicine may be used for other purposes; ask your health care provider or pharmacist if you have questions. COMMON BRAND NAME(S): Nitroquick, Nitrostat, Nitrotab What should I tell my health care provider before I take this medicine? They need to know if you have any of these conditions: anemia head injury, recent stroke, or bleeding in the brain liver disease previous heart attack an unusual or allergic reaction to nitroglycerin, other medicines, foods, dyes, or preservatives pregnant or trying to get pregnant breast-feeding How should I use this medicine? Take this medicine by mouth as needed. Use at the first sign of an angina attack (chest pain or tightness). You can also take this medicine 5 to 10 minutes before an event likely to produce chest pain. Follow the directions exactly as written on the  prescription label. Place one tablet under your tongue and let it dissolve. Do not swallow whole. Replace the dose if you accidentally swallow it. It will help if your mouth is not dry. Saliva around the tablet will help it to dissolve more quickly. Do not eat  or drink, smoke or chew tobacco while a tablet is dissolving. Sit down when taking this medicine. In an angina attack, you should feel better within 5 minutes after your first dose. You can take a dose every 5 minutes up to a total of 3 doses. If you do not feel better or feel worse after 1 dose, call 9-1-1 at once. Do not take more than 3 doses in 15 minutes. Your health care provider might give you other directions. Follow those directions if he or she does. Do not take your medicine more often than directed. Talk to your health care provider about the use of this medicine in children. Special care may be needed. Overdosage: If you think you have taken too much of this medicine contact a poison control center or emergency room at once. NOTE: This medicine is only for you. Do not share this medicine with others. What if I miss a dose? This does not apply. This medicine is only used as needed. What may interact with this medicine? Do not take this medicine with any of the following medications: certain migraine medicines like ergotamine and dihydroergotamine (DHE) medicines used to treat erectile dysfunction like sildenafil, tadalafil, and vardenafil riociguat This medicine may also interact with the following medications: alteplase aspirin heparin medicines for high blood pressure medicines for mental depression other medicines used to treat angina phenothiazines like chlorpromazine, mesoridazine, prochlorperazine, thioridazine This list may not describe all possible interactions. Give your health care provider a list of all the medicines, herbs, non-prescription drugs, or dietary supplements you use. Also tell them if you smoke, drink  alcohol, or use illegal drugs. Some items may interact with your medicine. What should I watch for while using this medicine? Tell your doctor or health care professional if you feel your medicine is no longer working. Keep this medicine with you at all times. Sit or lie down when you take your medicine to prevent falling if you feel dizzy or faint after using it. Try to remain calm. This will help you to feel better faster. If you feel dizzy, take several deep breaths and lie down with your feet propped up, or bend forward with your head resting between your knees. You may get drowsy or dizzy. Do not drive, use machinery, or do anything that needs mental alertness until you know how this drug affects you. Do not stand or sit up quickly, especially if you are an older patient. This reduces the risk of dizzy or fainting spells. Alcohol can make you more drowsy and dizzy. Avoid alcoholic drinks. Do not treat yourself for coughs, colds, or pain while you are taking this medicine without asking your doctor or health care professional for advice. Some ingredients may increase your blood pressure. What side effects may I notice from receiving this medicine? Side effects that you should report to your doctor or health care professional as soon as possible: allergic reactions (skin rash, itching or hives; swelling of the face, lips, or tongue) low blood pressure (dizziness; feeling faint or lightheaded, falls; unusually weak or tired) low red blood cell counts (trouble breathing; feeling faint; lightheaded, falls; unusually weak or tired) Side effects that usually do not require medical attention (report to your doctor or health care professional if they continue or are bothersome): facial flushing (redness) headache nausea, vomiting This list may not describe all possible side effects. Call your doctor for medical advice about side effects. You may report side effects to FDA at 1-800-FDA-1088. Where should  I keep my medicine? Keep out of the reach of children. Store at room temperature between 20 and 25 degrees C (68 and 77 degrees F). Store in [employer]. Protect from light and moisture. Keep tightly closed. Throw away any unused medicine after the expiration date. NOTE: This sheet is a summary. It may not cover all possible information. If you have questions about this medicine, talk to your doctor, pharmacist, or health care provider.  2021 Elsevier/Gold Standard (2018-09-11 16:46:32)

## 2021-10-11 ENCOUNTER — Telehealth: Payer: Self-pay

## 2021-10-11 LAB — CBC WITH DIFFERENTIAL/PLATELET
Basophils Absolute: 0.1 10*3/uL (ref 0.0–0.2)
Basos: 1 %
EOS (ABSOLUTE): 0.3 10*3/uL (ref 0.0–0.4)
Eos: 4 %
Hematocrit: 30.3 % — ABNORMAL LOW (ref 34.0–46.6)
Hemoglobin: 10.3 g/dL — ABNORMAL LOW (ref 11.1–15.9)
Immature Grans (Abs): 0 10*3/uL (ref 0.0–0.1)
Immature Granulocytes: 0 %
Lymphocytes Absolute: 1.6 10*3/uL (ref 0.7–3.1)
Lymphs: 25 %
MCH: 30.4 pg (ref 26.6–33.0)
MCHC: 34 g/dL (ref 31.5–35.7)
MCV: 89 fL (ref 79–97)
Monocytes Absolute: 0.5 10*3/uL (ref 0.1–0.9)
Monocytes: 8 %
Neutrophils Absolute: 4 10*3/uL (ref 1.4–7.0)
Neutrophils: 62 %
Platelets: 206 10*3/uL (ref 150–450)
RBC: 3.39 x10E6/uL — ABNORMAL LOW (ref 3.77–5.28)
RDW: 13.2 % (ref 11.7–15.4)
WBC: 6.4 10*3/uL (ref 3.4–10.8)

## 2021-10-11 LAB — LIPID PANEL
Chol/HDL Ratio: 3.5 ratio (ref 0.0–4.4)
Cholesterol, Total: 145 mg/dL (ref 100–199)
HDL: 42 mg/dL (ref >39)
LDL Chol Calc (NIH): 63 mg/dL (ref 0–99)
Triglycerides: 248 mg/dL — ABNORMAL HIGH (ref 0–149)
VLDL Cholesterol Cal: 40 mg/dL (ref 5–40)

## 2021-10-11 LAB — BASIC METABOLIC PANEL
BUN/Creatinine Ratio: 25 (ref 12–28)
BUN: 30 mg/dL — ABNORMAL HIGH (ref 8–27)
CO2: 24 mmol/L (ref 20–29)
Calcium: 9.9 mg/dL (ref 8.7–10.3)
Chloride: 99 mmol/L (ref 96–106)
Creatinine, Ser: 1.2 mg/dL — ABNORMAL HIGH (ref 0.57–1.00)
Glucose: 103 mg/dL — ABNORMAL HIGH (ref 70–99)
Potassium: 4.6 mmol/L (ref 3.5–5.2)
Sodium: 135 mmol/L (ref 134–144)
eGFR: 48 mL/min/{1.73_m2} — ABNORMAL LOW (ref >59)

## 2021-10-11 NOTE — Telephone Encounter (Signed)
Left voice message for pt to call back.

## 2021-10-11 NOTE — Telephone Encounter (Signed)
-----   Message from Richardo Priest, MD sent at 10/11/2021  8:10 AM EDT ----- Stable results left heart catheterization

## 2021-10-17 ENCOUNTER — Telehealth: Payer: Self-pay | Admitting: *Deleted

## 2021-10-17 NOTE — Telephone Encounter (Signed)
Cardiac catheterization scheduled at [facility] for: Tuesday October 18, 2021 10:30 AM [facility] Main Entrance A [facility]) at: 8:30 AM   No solid food after midnight prior to cath, clear liquids until 5 AM day of procedure.  Medication instructions: Hold: Chlorthalidone/KCl -AM of procedure-GFR 48 Valsartan-AM of procedure-GFR 48  Except hold medications usual morning medications can be taken pre-cath with sips of water including: -aspirin 81 mg -Plavix 75 mg    Confirmed patient has responsible adult to drive home post procedure and be with patient first 24 hours after arriving home.  [facility] does allow one visitor to accompany you and wait in the hospital waiting room while you are there for your procedure. You and your visitor will be asked to wear a mask once you enter the hospital.   Patient reports does not currently have any new symptoms concerning for COVID-19 and no household members with COVID-19 like illness.    Reviewed procedure/mask/visitor instructions with patient.

## 2021-10-18 ENCOUNTER — Ambulatory Visit (HOSPITAL_COMMUNITY)
Admission: RE | Admit: 2021-10-18 | Discharge: 2021-10-18 | Disposition: A | Discharge status: Home / Self Care | Payer: Medicare Other | Attending: Cardiology | Admitting: Cardiology

## 2021-10-18 ENCOUNTER — Other Ambulatory Visit: Payer: Self-pay

## 2021-10-18 ENCOUNTER — Encounter (HOSPITAL_COMMUNITY)
Admission: RE | Disposition: A | Discharge status: Home / Self Care | Payer: Self-pay | Source: Home / Self Care | Attending: Cardiology

## 2021-10-18 ENCOUNTER — Encounter: Payer: Self-pay | Admitting: Oncology

## 2021-10-18 ENCOUNTER — Other Ambulatory Visit (HOSPITAL_COMMUNITY): Payer: Self-pay

## 2021-10-18 DIAGNOSIS — Z79899 Other long term (current) drug therapy: Secondary | ICD-10-CM | POA: Diagnosis not present

## 2021-10-18 DIAGNOSIS — T82855A Stenosis of coronary artery stent, initial encounter: Secondary | ICD-10-CM | POA: Insufficient documentation

## 2021-10-18 DIAGNOSIS — D631 Anemia in chronic kidney disease: Secondary | ICD-10-CM | POA: Diagnosis not present

## 2021-10-18 DIAGNOSIS — I25110 Atherosclerotic heart disease of native coronary artery with unstable angina pectoris: Secondary | ICD-10-CM | POA: Diagnosis not present

## 2021-10-18 DIAGNOSIS — N189 Chronic kidney disease, unspecified: Secondary | ICD-10-CM | POA: Diagnosis not present

## 2021-10-18 DIAGNOSIS — Z9861 Coronary angioplasty status: Secondary | ICD-10-CM

## 2021-10-18 DIAGNOSIS — Z87891 Personal history of nicotine dependence: Secondary | ICD-10-CM | POA: Diagnosis not present

## 2021-10-18 DIAGNOSIS — E785 Hyperlipidemia, unspecified: Secondary | ICD-10-CM | POA: Diagnosis present

## 2021-10-18 DIAGNOSIS — Z7902 Long term (current) use of antithrombotics/antiplatelets: Secondary | ICD-10-CM | POA: Insufficient documentation

## 2021-10-18 DIAGNOSIS — Z955 Presence of coronary angioplasty implant and graft: Secondary | ICD-10-CM | POA: Insufficient documentation

## 2021-10-18 DIAGNOSIS — D649 Anemia, unspecified: Secondary | ICD-10-CM | POA: Diagnosis present

## 2021-10-18 DIAGNOSIS — I200 Unstable angina: Secondary | ICD-10-CM

## 2021-10-18 DIAGNOSIS — I2510 Atherosclerotic heart disease of native coronary artery without angina pectoris: Secondary | ICD-10-CM

## 2021-10-18 DIAGNOSIS — I129 Hypertensive chronic kidney disease with stage 1 through stage 4 chronic kidney disease, or unspecified chronic kidney disease: Secondary | ICD-10-CM | POA: Diagnosis not present

## 2021-10-18 DIAGNOSIS — E7800 Pure hypercholesterolemia, unspecified: Secondary | ICD-10-CM | POA: Diagnosis not present

## 2021-10-18 DIAGNOSIS — Y832 Surgical operation with anastomosis, bypass or graft as the cause of abnormal reaction of the patient, or of later complication, without mention of misadventure at the time of the procedure: Secondary | ICD-10-CM | POA: Insufficient documentation

## 2021-10-18 DIAGNOSIS — Z880 Allergy status to penicillin: Secondary | ICD-10-CM | POA: Insufficient documentation

## 2021-10-18 DIAGNOSIS — Z8249 Family history of ischemic heart disease and other diseases of the circulatory system: Secondary | ICD-10-CM | POA: Diagnosis not present

## 2021-10-18 DIAGNOSIS — I25119 Atherosclerotic heart disease of native coronary artery with unspecified angina pectoris: Secondary | ICD-10-CM

## 2021-10-18 DIAGNOSIS — I10 Essential (primary) hypertension: Secondary | ICD-10-CM | POA: Diagnosis present

## 2021-10-18 HISTORY — DX: Unstable angina: I20.0

## 2021-10-18 HISTORY — PX: LEFT HEART CATH AND CORONARY ANGIOGRAPHY: CATH118249

## 2021-10-18 HISTORY — PX: CORONARY STENT INTERVENTION: CATH118234

## 2021-10-18 LAB — POCT ACTIVATED CLOTTING TIME
Activated Clotting Time: 242 seconds
Activated Clotting Time: 277 seconds

## 2021-10-18 SURGERY — LEFT HEART CATH AND CORONARY ANGIOGRAPHY
Anesthesia: LOCAL

## 2021-10-18 MED ORDER — MIDAZOLAM HCL 2 MG/2ML IJ SOLN
INTRAMUSCULAR | Status: AC
  Filled 2021-10-18: qty 2

## 2021-10-18 MED ORDER — ONDANSETRON HCL 4 MG/2ML IJ SOLN: 4.0000 mg | Freq: Four times a day (QID) | INTRAMUSCULAR | Status: DC | PRN

## 2021-10-18 MED ORDER — ASPIRIN 81 MG PO CHEW: 81.0000 mg | CHEWABLE_TABLET | ORAL | Status: DC

## 2021-10-18 MED ORDER — LIDOCAINE HCL (PF) 1 % IJ SOLN
INTRAMUSCULAR | Status: DC | PRN
  Administered 2021-10-18: 2 mL

## 2021-10-18 MED ORDER — SODIUM CHLORIDE 0.9% FLUSH: 3.0000 mL | Freq: Two times a day (BID) | INTRAVENOUS | Status: DC

## 2021-10-18 MED ORDER — ATORVASTATIN CALCIUM 40 MG PO TABS: 40.0000 mg | ORAL_TABLET | Freq: Every day | ORAL | 5 refills | Status: DC

## 2021-10-18 MED ORDER — HEPARIN (PORCINE) IN NACL 1000-0.9 UT/500ML-% IV SOLN
INTRAVENOUS | Status: AC
  Filled 2021-10-18: qty 500

## 2021-10-18 MED ORDER — ASPIRIN EC 81 MG PO TBEC: 81.0000 mg | DELAYED_RELEASE_TABLET | Freq: Every day | ORAL | 11 refills | Status: DC

## 2021-10-18 MED ORDER — SODIUM CHLORIDE 0.9% FLUSH: 3.0000 mL | INTRAVENOUS | Status: DC | PRN

## 2021-10-18 MED ORDER — HYDRALAZINE HCL 20 MG/ML IJ SOLN: 10.0000 mg | INTRAMUSCULAR | Status: DC | PRN

## 2021-10-18 MED ORDER — FENTANYL CITRATE (PF) 100 MCG/2ML IJ SOLN
INTRAMUSCULAR | Status: AC
  Filled 2021-10-18: qty 2

## 2021-10-18 MED ORDER — SODIUM CHLORIDE 0.9 % IV SOLN: INTRAVENOUS | Status: DC

## 2021-10-18 MED ORDER — SODIUM CHLORIDE 0.9 % WEIGHT BASED INFUSION
3.0000 mL/kg/h | INTRAVENOUS | Status: AC
  Administered 2021-10-18: 3 mL/kg/h via INTRAVENOUS

## 2021-10-18 MED ORDER — FENTANYL CITRATE (PF) 100 MCG/2ML IJ SOLN
INTRAMUSCULAR | Status: DC | PRN
  Administered 2021-10-18 (×5): 25 ug via INTRAVENOUS

## 2021-10-18 MED ORDER — CLOPIDOGREL BISULFATE 300 MG PO TABS
ORAL_TABLET | ORAL | Status: DC | PRN
  Administered 2021-10-18: 300 mg via ORAL

## 2021-10-18 MED ORDER — HEPARIN SODIUM (PORCINE) 1000 UNIT/ML IJ SOLN
INTRAMUSCULAR | Status: DC | PRN
  Administered 2021-10-18: 2000 [IU] via INTRAVENOUS
  Administered 2021-10-18 (×2): 3500 [IU] via INTRAVENOUS

## 2021-10-18 MED ORDER — SODIUM CHLORIDE 0.9 % IV SOLN: 250.0000 mL | INTRAVENOUS | Status: DC | PRN

## 2021-10-18 MED ORDER — VERAPAMIL HCL 2.5 MG/ML IV SOLN
INTRAVENOUS | Status: DC | PRN
  Administered 2021-10-18 (×2): 10 mL via INTRA_ARTERIAL

## 2021-10-18 MED ORDER — MIDAZOLAM HCL 2 MG/2ML IJ SOLN
INTRAMUSCULAR | Status: DC | PRN
  Administered 2021-10-18 (×2): 1 mg via INTRAVENOUS
  Administered 2021-10-18: 2 mg via INTRAVENOUS

## 2021-10-18 MED ORDER — NITROGLYCERIN 1 MG/10 ML FOR IR/CATH LAB
INTRA_ARTERIAL | Status: AC
  Filled 2021-10-18: qty 10

## 2021-10-18 MED ORDER — ACETAMINOPHEN 325 MG PO TABS: 650.0000 mg | ORAL_TABLET | ORAL | Status: DC | PRN

## 2021-10-18 MED ORDER — LABETALOL HCL 5 MG/ML IV SOLN: 10.0000 mg | INTRAVENOUS | Status: DC | PRN

## 2021-10-18 MED ORDER — NITROGLYCERIN 1 MG/10 ML FOR IR/CATH LAB
INTRA_ARTERIAL | Status: DC | PRN
  Administered 2021-10-18 (×2): 200 ug

## 2021-10-18 MED ORDER — LIDOCAINE HCL (PF) 1 % IJ SOLN
INTRAMUSCULAR | Status: AC
  Filled 2021-10-18: qty 30

## 2021-10-18 MED ORDER — ATORVASTATIN CALCIUM 40 MG PO TABS
40.0000 mg | ORAL_TABLET | Freq: Every day | ORAL | 5 refills | Status: DC
  Filled 2021-10-18: qty 30, 30d supply, fill #0

## 2021-10-18 MED ORDER — CLOPIDOGREL BISULFATE 300 MG PO TABS
ORAL_TABLET | ORAL | Status: AC
  Filled 2021-10-18: qty 1

## 2021-10-18 MED ORDER — SODIUM CHLORIDE 0.9 % WEIGHT BASED INFUSION: 1.0000 mL/kg/h | INTRAVENOUS | Status: DC

## 2021-10-18 MED ORDER — HEPARIN (PORCINE) IN NACL 1000-0.9 UT/500ML-% IV SOLN
INTRAVENOUS | Status: DC | PRN
  Administered 2021-10-18 (×2): 500 mL

## 2021-10-18 SURGICAL SUPPLY — 22 items
BALLN SAPPHIRE 2.0X15 (BALLOONS) ×2
BALLN SCOREFLEX 2.50X15 (BALLOONS) ×2
BALLN ~~LOC~~ EMERGE MR 3.0X20 (BALLOONS) ×2
BALLOON SAPPHIRE 2.0X15 (BALLOONS) IMPLANT
BALLOON SCOREFLEX 2.50X15 (BALLOONS) IMPLANT
BALLOON ~~LOC~~ EMERGE MR 3.0X20 (BALLOONS) IMPLANT
CATH OPTITORQUE TIG 4.0 5F (CATHETERS) ×1 IMPLANT
CATH VISTA GUIDE 6FR JR4 (CATHETERS) ×1 IMPLANT
DEVICE RAD COMP TR BAND LRG (VASCULAR PRODUCTS) ×1 IMPLANT
GLIDESHEATH SLEND SS 6F .021 (SHEATH) ×1 IMPLANT
GUIDEWIRE INQWIRE 1.5J.035X260 (WIRE) IMPLANT
INQWIRE 1.5J .035X260CM (WIRE) ×2
KIT ENCORE 26 ADVANTAGE (KITS) ×1 IMPLANT
KIT HEART LEFT (KITS) ×2 IMPLANT
PACK CARDIAC CATHETERIZATION (CUSTOM PROCEDURE TRAY) ×2 IMPLANT
SHEATH PROBE COVER 6X72 (BAG) ×1 IMPLANT
STENT SYNERGY XD 2.75X28 (Permanent Stent) IMPLANT
SYNERGY XD 2.75X28 (Permanent Stent) ×2 IMPLANT
SYR MEDRAD MARK 7 150ML (SYRINGE) ×2 IMPLANT
TRANSDUCER W/STOPCOCK (MISCELLANEOUS) ×2 IMPLANT
TUBING CIL FLEX 10 FLL-RA (TUBING) ×2 IMPLANT
WIRE ASAHI PROWATER 180CM (WIRE) ×1 IMPLANT

## 2021-10-18 NOTE — Interval H&P Note (Signed)
History and Physical Interval Note:  10/18/2021 9:37 AM  Angela Mccoy  has presented today for surgery, with the diagnosis of CAD w/ unstable angina.  The various methods of treatment have been discussed with the patient and family. After consideration of risks, benefits and other options for treatment, the patient has consented to  Procedure(s): LEFT HEART CATH AND CORONARY ANGIOGRAPHY (N/A)  PERCUTANEOUS CORONARY INTERVENTION   as a surgical intervention.  The patient's history has been reviewed, patient examined, no change in status, stable for surgery.  I have reviewed the patient's chart and labs.  Questions were answered to the patient's satisfaction.    Cath Lab Visit (complete for each Cath Lab visit)  Clinical Evaluation Leading to the Procedure:   ACS: No.  Non-ACS:    Anginal Classification: CCS III  Anti-ischemic medical therapy: Maximal Therapy (2 or more classes of medications)  Non-Invasive Test Results: No non-invasive testing performed  Prior CABG: No previous CABG   Angela Mccoy

## 2021-10-18 NOTE — Progress Notes (Signed)
Discharge instructions reviewed with pt and her husband. Both voice understanding.  

## 2021-10-18 NOTE — Discharge Summary (Addendum)
Discharge Summary for Same Day PCI   Patient ID: Angela Mccoy MRN: [id number]; DOB: 17-Mar-1946  Admit date: 10/18/2021 Discharge date: 10/18/2021  Primary Care Provider: Ronita Hipps, MD  Primary Cardiologist: [name], MD  Primary Electrophysiologist:  None   Discharge Diagnoses    Principal Problem:   Unstable angina [facility]) Active Problems:   Anemia   Essential hypertension   Hyperlipidemia with target LDL less than 70   CAD S/P percutaneous coronary angioplasty    Diagnostic Studies/Procedures    Cardiac Catheterization 10/18/2021: Impression:   Ost RCA to Prox RCA stent is 10% in-stent restenosis.   CULPRIT LESION: Prox RCA to Mid RCA lesion is 85% stenosed just beyond the stent.   After scoring balloon angioplasty, A drug-eluting stent was successfully placed (overlapping previous stent) using a SYNERGY XD 2.75X28.  Postdilated to 3.1 mm   Post intervention, there is a 5% residual stenosis at the most significant segment, however the remainder has 0% visual stenosis.Marland Kitchen   ------------------------------   Mid LAD to Dist LAD previously placed stent is 10% stenosed (actually focally at small D2).   ------------------------------   The left ventricular systolic function is normal.  The left ventricular ejection fraction is 55-65% by visual estimate.   There is no aortic valve stenosis.   Summary: Two-vessel CAD: Widely patent LAD stent with maybe 10 to 15% ISR, otherwise normal LCA system with small nondominant LCx Large dominant RCA with proximal stent that has distal edge leading into a native vessel 85% irregular stenosis.  (CULPRIT LESION) Successful Scoring Balloon Angioplasty followed by DES PCI with an overlapping DES stent (2.75 mm x 28 mm postdilated to 3.1 mm) Normal LVEF and EDP.   Recommendations: Stable for Same-Day PCI. Is already on Plavix -> uninterrupted DAPT now for at least 6 months. Continue aggressive risk factor modification.    Follow-up with Dr. Bettina Gavia.  Diagnostic Dominance: Right   Intervention    History of Present Illness     Angela Mccoy is a 75 y.o. female with a history of CAD s/p remote PCI/DES to LAD and RCA in 2003, hypertension, dyslipidemia, chronic lower extremity edema, and chronic anemia followed by HemOnc who is followed by Dr. Bettina Gavia. She had a Myoview in 05/2021 which was low risk and showed no evidence of ischemia. Patient was recently seen by Dr. Bettina Gavia on 10/10/2021 at which time she reported frequent angina requiring at least 12 doses of sublingual Nitro within the last month. She described the pain as a substernal non-radiating pressure/aching that occurred primarily at night and was waking her up from sleep. She was also having some abdominal pain and diarrhea for which she is scheduled to see GI for next week. Given concern for unstable angina, outpatient cardiac catheterization was arranged for further evaluation.  Hospital Course     Patient presented to [facility] on 10/18/2021 for planned outpatient cardiac catheterization which showed 85% stenosis of the proximal to mid LAD. Patient underwent successful PCI with balloon angioplasty and DES to this RCA lesion overlapping prior RCA stent. Plan is for DAPT with Aspirin and Plavix for at least 6 months. She is already on Plavix at home so will restart Aspirin 81mg  daily. The patient was seen by Cardiac Rehab while in short stay. There were no observed complications post cath. Right radial cath site was re-evaluated prior to discharge and found to be stable without any complications. Instructions/precautions regarding cath site care were given prior to discharge.  Angela Mccoy was seen by Dr. Ellyn Hack and determined stable for discharge home. Follow up with our office has been arranged. Medications are listed below. Pertinent changes: Aspirin 81mg  restarted (already on Plavix). Home Pravastatin was stopped and she was  started on Lipitor 40mg  daily. Will need repeat lipid panel and LFTs in 6-8 weeks. Will provider patient a note excusing her from work for 1 week (she does not do any heavy lifting at her job but she does a lot of sewing). _____________  Cath/PCI Registry Performance & Quality Measures: Aspirin prescribed? - Yes ADP Receptor Inhibitor (Plavix/Clopidogrel, Brilinta/Ticagrelor or Effient/Prasugrel) prescribed (includes medically managed patients)? - Yes High Intensity Statin (Lipitor 40-80mg  or Crestor 20-40mg ) prescribed? - Yes For EF <40%, was ACEI/ARB prescribed? - Not Applicable (EF >/= 62%) For EF <40%, Aldosterone Antagonist (Spironolactone or Eplerenone) prescribed? - Not Applicable (EF >/= 83%) Cardiac Rehab Phase II ordered (Included Medically managed Patients)? - Yes  _____________   Discharge Vitals Blood pressure (!) 145/53, pulse (!) 58, temperature 98 F (36.7 C), temperature source Oral, resp. rate 15, height 5\' 3"  (1.6 m), weight 63.5 kg, SpO2 95 %.  Filed Weights   10/18/21 0843  Weight: 63.5 kg    Last Labs & Radiologic Studies    CBC No results for input(s): WBC, NEUTROABS, HGB, HCT, MCV, PLT in the last 72 hours. Basic Metabolic Panel No results for input(s): NA, K, CL, CO2, GLUCOSE, BUN, CREATININE, CALCIUM, MG, PHOS in the last 72 hours. Liver Function Tests No results for input(s): AST, ALT, ALKPHOS, BILITOT, PROT, ALBUMIN in the last 72 hours. No results for input(s): LIPASE, AMYLASE in the last 72 hours. High Sensitivity Troponin:   No results for input(s): TROPONINIHS in the last 720 hours.  BNP Invalid input(s): POCBNP D-Dimer No results for input(s): DDIMER in the last 72 hours. Hemoglobin A1C No results for input(s): HGBA1C in the last 72 hours. Fasting Lipid Panel No results for input(s): CHOL, HDL, LDLCALC, TRIG, CHOLHDL, LDLDIRECT in the last 72 hours. Thyroid Function Tests No results for input(s): TSH, T4TOTAL, T3FREE, THYROIDAB in the last 72  hours.  Invalid input(s): FREET3 _____________  CARDIAC CATHETERIZATION  Result Date: 10/18/2021   Ost RCA to Prox RCA stent is 10% in-stent restenosis.   CULPRIT LESION: Prox RCA to Mid RCA lesion is 85% stenosed just beyond the stent.   After scoring balloon angioplasty, A drug-eluting stent was successfully placed (overlapping previous stent) using a SYNERGY XD 2.75X28.  Postdilated to 3.1 mm   Post intervention, there is a 5% residual stenosis at the most significant segment, however the remainder has 0% visual stenosis.Marland Kitchen   ------------------------------   Mid LAD to Dist LAD previously placed stent is 10% stenosed (actually focally at small D2).   ------------------------------   The left ventricular systolic function is normal.  The left ventricular ejection fraction is 55-65% by visual estimate.   There is no aortic valve stenosis. SUMMARY Two-vessel CAD: Widely patent LAD stent with maybe 10 to 15% ISR, otherwise normal LCA system with small nondominant LCx Large dominant RCA with proximal stent that has distal edge leading into a native vessel 85% irregular stenosis.  (CULPRIT LESION) Successful Scoring Balloon Angioplasty followed by DES PCI with an overlapping DES stent (2.75 mm x 28 mm postdilated to 3.1 mm) Normal LVEF and EDP. RECOMMENDATIONS Stable for Same-Day PCI. Is already on Plavix -> uninterrupted DAPT now for at least 6 months. Continue aggressive risk factor modification. Follow-up with Dr. Bettina Gavia. Shanon Brow  Ellyn Hack, MD   Disposition   Patient is being discharged home today in good condition.  Follow-up Plans & Appointments     Follow-up Information     Richardo Priest, MD Follow up.   Specialties: Cardiology, Radiology Why: Follow-up with Dr. Bettina Gavia scheduled for 10/31/2021 at 11:20am. Please arrive 15 minutes early for check-in. If this date/time does not work for you, please call our office to reschedule. Contact information: [place]  [id number] [phone]                Discharge Instructions     Amb Referral to Cardiac Rehabilitation   Complete by: As directed    To [place]   Diagnosis:  Coronary Stents PTCA     After initial evaluation and assessments completed: Virtual Based Care may be provided alone or in conjunction with Phase 2 Cardiac Rehab based on patient barriers.: Yes        Discharge Medications   Allergies as of 10/18/2021       Reactions   Penicillins Anaphylaxis   Did it involve swelling of the face/tongue/throat, SOB, or low BP? Yes Did it involve sudden or severe rash/hives, skin peeling, or any reaction on the inside of your mouth or nose? No Did you need to seek medical attention at a hospital or doctor's office? Yes When did it last happen?      2012 If all above answers are "NO", may proceed with cephalosporin use.        Medication List     STOP taking these medications    Nitroglycerin 0.4 % Oint   pravastatin 40 MG tablet Commonly known as: PRAVACHOL       TAKE these medications    aspirin EC 81 MG tablet Take 1 tablet (81 mg total) by mouth daily. Swallow whole. What changed:  when to take this additional instructions   atorvastatin 40 MG tablet Commonly known as: Lipitor Take 1 tablet (40 mg total) by mouth daily.   carvedilol 6.25 MG tablet Commonly known as: COREG TAKE 1 TABLET(6.25 MG) BY MOUTH TWICE DAILY WITH A MEAL What changed: See the new instructions.   chlorthalidone 25 MG tablet Commonly known as: HYGROTON Take 25 mg by mouth daily.   citalopram 40 MG tablet Commonly known as: CELEXA Take 40 mg by mouth daily.   clopidogrel 75 MG tablet Commonly known as: PLAVIX TAKE 1 TABLET(75 MG) BY MOUTH DAILY What changed: See the new instructions.   dicyclomine 10 MG capsule Commonly known as: BENTYL Take 10 mg by mouth 3 (three) times daily before meals.   isosorbide mononitrate 30 MG 24 hr tablet Commonly known as: IMDUR Take 30  mg by mouth daily.   multivitamin with minerals tablet Take 1 tablet by mouth daily.   nitroGLYCERIN 0.4 MG SL tablet Commonly known as: NITROSTAT ONE TABLET UNDER TONGUE AS NEEDED FOR CHEST PAIN EVERY 5 MINUTES What changed:  how much to take how to take this when to take this reasons to take this Another medication with the same name was removed. Continue taking this medication, and follow the directions you see here.   pantoprazole 40 MG tablet Commonly known as: PROTONIX TAKE 1 TABLET(40 MG) BY MOUTH DAILY What changed: See the new instructions.   potassium chloride SA 20 MEQ tablet Commonly known as: KLOR-CON Take 1 tablet (20 mEq total) by mouth 2 (two) times daily. What changed: when to take this   QUEtiapine 200 MG tablet Commonly  known as: SEROQUEL Take 200 mg by mouth at bedtime.   valsartan 80 MG tablet Commonly known as: Diovan Take 1 tablet (80 mg total) by mouth daily.           Allergies Allergies  Allergen Reactions   Penicillins Anaphylaxis    Did it involve swelling of the face/tongue/throat, SOB, or low BP? Yes Did it involve sudden or severe rash/hives, skin peeling, or any reaction on the inside of your mouth or nose? No Did you need to seek medical attention at a hospital or doctor's office? Yes When did it last happen?      2012 If all above answers are "NO", may proceed with cephalosporin use.     Outstanding Labs/Studies   Repeat lipid panel and LFTs in 6-8 weeks.  Duration of Discharge Encounter   Greater than 30 minutes including physician time.  Signed, Darreld Mclean, PA-C 10/18/2021, 2:41 PM

## 2021-10-18 NOTE — Progress Notes (Signed)
Discussed stent, Plavix, restrictions, diet, exercise, NTG and CRPII. Receptive. She exercises daily at gym. Encouraged walking this week. Will refer to [facility] however pt is not interested (did previously). Discussed her triglyerides and diet interventions.  [place], ACSM 1:45 PM 10/18/2021

## 2021-10-19 ENCOUNTER — Encounter (HOSPITAL_COMMUNITY): Payer: Self-pay | Admitting: Cardiology

## 2021-10-20 ENCOUNTER — Other Ambulatory Visit: Payer: Self-pay | Admitting: Hematology and Oncology

## 2021-10-20 ENCOUNTER — Inpatient Hospital Stay: Payer: Medicare Other | Attending: Oncology

## 2021-10-20 DIAGNOSIS — Z23 Encounter for immunization: Secondary | ICD-10-CM | POA: Diagnosis not present

## 2021-10-20 DIAGNOSIS — D631 Anemia in chronic kidney disease: Secondary | ICD-10-CM | POA: Diagnosis not present

## 2021-10-20 DIAGNOSIS — D649 Anemia, unspecified: Secondary | ICD-10-CM | POA: Diagnosis not present

## 2021-10-20 DIAGNOSIS — N183 Chronic kidney disease, stage 3 unspecified: Secondary | ICD-10-CM

## 2021-10-20 DIAGNOSIS — N189 Chronic kidney disease, unspecified: Secondary | ICD-10-CM | POA: Diagnosis not present

## 2021-10-20 LAB — CBC AND DIFFERENTIAL
HCT: 30 — AB (ref 36–46)
Hemoglobin: 10.1 — AB (ref 12.0–16.0)
Neutrophils Absolute: 4.89
Platelets: 189 (ref 150–399)
WBC: 7.3

## 2021-10-20 LAB — CBC
MCV: 90 (ref 81–99)
RBC: 3.29 — AB (ref 3.87–5.11)

## 2021-10-30 NOTE — Progress Notes (Signed)
Cardiology Office Note:    Date:  10/31/2021   ID:  Angela Mccoy, DOB 1946-05-15, MRN [id number]  PCP:  Ronita Hipps, MD  Cardiologist:  [name], MD    Referring MD: Ronita Hipps, MD    ASSESSMENT:    1. Coronary artery disease involving native coronary artery of native heart with angina pectoris ([place])   2. Essential hypertension   3. Hyperlipidemia, unspecified hyperlipidemia type   4. CKD (chronic kidney disease) stage 4, GFR 15-29 ml/min (HCC)   5. Muscle cramps    PLAN:    In order of problems listed above:  Stable CAD markedly improved post PCI will continue on her left and dual antiplatelet aspirin clopidogrel along with her isosorbide beta-blocker and lipid-lowering with a high intensity statin Stable blood pressure target Continue her statin recent lipid profile LDL at target Stable CKD Trial over-the-counter quinine   Next appointment: 6 months   Medication Adjustments/Labs and Tests Ordered: Current medicines are reviewed at length with the patient today.  Concerns regarding medicines are outlined above.  No orders of the defined types were placed in this encounter.  No orders of the defined types were placed in this encounter.  Chief complaint follow-up after PCI and stent  History of Present Illness:    Angela Mccoy is a 75 y.o. female with a hx of CAD with remote PCI and drug-eluting stent to the LAD and right coronary artery in 2003 dyslipidemia hypertension chronic anemia chronic lower extremity edema.  She underwent a myocardial perfusion study 05/26/2001 showing normal perfusion EF 67% no ischemic ST changes low risk.  She has anemia of chronic renal disease and has been receiving erythropoietin and Dr. Beckie Salts hematology oncology.  She was last seen 10/10/2022 with accelerating pattern of angina and referred for coronary angiography.  Compliance with diet, lifestyle and medications: Yes  She is  improved no angina dyspnea edema palpitation or syncope. Scheduled to be seen by GI later this month for ongoing problems with diverticular disease She is having muscle cramping and will take over-the-counter quinine  She underwent left heart catheterization 10/18/2021 with 85% mid right coronary lesion outside of the stent and had successful PCI and stent of the right coronary artery. Conclusion      Ost RCA to Prox RCA stent is 10% in-stent restenosis.   CULPRIT LESION: Prox RCA to Mid RCA lesion is 85% stenosed just beyond the stent.   After scoring balloon angioplasty, A drug-eluting stent was successfully placed (overlapping previous stent) using a SYNERGY XD 2.75X28.  Postdilated to 3.1 mm   Post intervention, there is a 5% residual stenosis at the most significant segment, however the remainder has 0% visual stenosis.Marland Kitchen   ------------------------------   Mid LAD to Dist LAD previously placed stent is 10% stenosed (actually focally at small D2).   ------------------------------   The left ventricular systolic function is normal.  The left ventricular ejection fraction is 55-65% by visual estimate.   There is no aortic valve stenosis.   SUMMARY Two-vessel CAD: Widely patent LAD stent with maybe 10 to 15% ISR, otherwise normal LCA system with small nondominant LCx Large dominant RCA with proximal stent that has distal edge leading into a native vessel 85% irregular stenosis.  (CULPRIT LESION) Successful Scoring Balloon Angioplasty followed by DES PCI with an overlapping DES stent (2.75 mm x 28 mm postdilated to 3.1 mm) Normal LVEF and EDP.     RECOMMENDATIONS Stable for Same-Day  PCI. Is already on Plavix -> uninterrupted DAPT now for at least 6 months. Continue aggressive risk factor modification Coronary Diagrams  Diagnostic Dominance: Right Intervention   Past Medical History:  Diagnosis Date   Anemia 07/31/2017   Anemia in chronic kidney disease 10/03/2020   Carotid bruit  02/26/2016   Carotid bruit present 02/26/2016   Cavitary pneumonia 03/20/2019   Onset of symptoms around late  Feb 2020 > all symptoms resolved p 5 days Meropenem and rx x 2 weeks then changed to cleocin -  D/c cleocin 03/20/2019  With esr 12, wbc 7,800  -  Quant TB 03/20/2019  Neg  - 05/14/2019 cxr with minimal streaky residual, no as dz    Coronary artery disease involving native coronary artery 02/26/2016   Coronary artery disease involving native coronary artery of native heart with angina pectoris (HCC) 03/12/2016   Overview:  PCI and DES to LAD and RCA in 2003, cath 2010 wo restenosis   Essential hypertension 03/12/2016   High cholesterol 01/17/2017   Hx of diverticulitis of colon 09/11/2016   Hyperlipidemia 03/12/2016   Hypertension 01/27/2016   Insomnia 02/26/2016   Migraine with aura 02/26/2016   Mitral valve prolapse 02/26/2016   Peripheral vascular disease (HCC) 02/26/2016   Persistent atrial fibrillation (HCC) 11/06/2019   Pneumonia of right upper lobe due to infectious organism 02/26/2019   Pre-op evaluation 11/19/2019   Rheumatoid arthritis involving multiple joints (HCC) 02/26/2016   Rheumatoid arthritis involving multiple sites (HCC) 02/26/2016    Past Surgical History:  Procedure Laterality Date   ABDOMINAL HYSTERECTOMY     CHOLECYSTECTOMY     COLONOSCOPY  10/21/2014   CORONARY STENT INTERVENTION N/A 10/18/2021   Procedure: CORONARY STENT INTERVENTION;  Surgeon: Harding, David W, MD;  Location: [facility] INVASIVE CV LAB;  Service: Cardiovascular;  Laterality: N/A;   heart stents     x 3   LEFT HEART CATH AND CORONARY ANGIOGRAPHY N/A 10/18/2021   Procedure: LEFT HEART CATH AND CORONARY ANGIOGRAPHY;  Surgeon: Harding, David W, MD;  Location: [facility] INVASIVE CV LAB;  Service: Cardiovascular;  Laterality: N/A;   rectal fissure      Current Medications: Current Meds  Medication Sig   aspirin EC 81 MG tablet Take 1 tablet (81 mg total) by mouth daily. Swallow whole.   atorvastatin (LIPITOR) 40 MG tablet  Take 1 tablet (40 mg total) by mouth daily.   carvedilol (COREG) 6.25 MG tablet TAKE 1 TABLET(6.25 MG) BY MOUTH TWICE DAILY WITH A MEAL   chlorthalidone (HYGROTON) 25 MG tablet Take 25 mg by mouth daily.   citalopram (CELEXA) 40 MG tablet Take 40 mg by mouth daily.   clopidogrel (PLAVIX) 75 MG tablet TAKE 1 TABLET(75 MG) BY MOUTH DAILY   isosorbide mononitrate (IMDUR) 30 MG 24 hr tablet Take 30 mg by mouth daily.   Multiple Vitamins-Minerals (MULTIVITAMIN WITH MINERALS) tablet Take 1 tablet by mouth daily.   nitroGLYCERIN (NITROSTAT) 0.4 MG SL tablet ONE TABLET UNDER TONGUE AS NEEDED FOR CHEST PAIN EVERY 5 MINUTES   pantoprazole (PROTONIX) 40 MG tablet TAKE 1 TABLET(40 MG) BY MOUTH DAILY   potassium chloride SA (KLOR-CON) 20 MEQ tablet Take 20 mEq by mouth 2 (two) times daily.   QUEtiapine (SEROQUEL) 200 MG tablet Take 200 mg by mouth at bedtime.   valsartan (DIOVAN) 80 MG tablet Take 1 tablet (80 mg total) by mouth daily.     Allergies:   Penicillins   Social History   Socioeconomic History   Marital   status: Married    Spouse name: Not on file   Number of children: Not on file   Years of education: Not on file   Highest education level: Not on file  Occupational History   Not on file  Tobacco Use   Smoking status: Former    Packs/day: 1.00    Years: 18.00    Pack years: 18.00    Types: Cigarettes    Quit date: 12/26/1987    Years since quitting: 33.8   Smokeless tobacco: Never  Vaping Use   Vaping Use: Never used  Substance and Sexual Activity   Alcohol use: No   Drug use: Never   Sexual activity: Not on file  Other Topics Concern   Not on file  Social History Narrative   Not on file   Social Determinants of Health   Financial Resource Strain: Not on file  Food Insecurity: Not on file  Transportation Needs: Not on file  Physical Activity: Not on file  Stress: Not on file  Social Connections: Not on file     Family History: The patient's family history includes  Alzheimer's disease in her father; Breast cancer in her sister; Heart attack in her brother, mother, and sister; Lung cancer in her brother; Stroke in her maternal grandmother. There is no history of Asthma. ROS:   Please see the history of present illness.    All other systems reviewed and are negative.  EKGs/Labs/Other Studies Reviewed:    The following studies were reviewed today:  EKG:  EKG performed 10/19/2019 to [facility] after PCI sinus rhythm first-degree AV block nonspecific ST personally reviewed  Recent Labs: 09/20/2021: ALT 12 10/10/2021: BUN 30; Creatinine, Ser 1.20; Potassium 4.6; Sodium 135 10/20/2021: Hemoglobin 10.1; Platelets 189  Recent Lipid Panel    Component Value Date/Time   CHOL 145 10/10/2021 0938   TRIG 248 (H) 10/10/2021 0938   HDL 42 10/10/2021 0938   CHOLHDL 3.5 10/10/2021 0938   LDLCALC 63 10/10/2021 0938    Physical Exam:    VS:  BP 122/60 (BP Location: Right Arm, Patient Position: Sitting, Cuff Size: Normal)   Pulse 62   Ht 5' 3" (1.6 m)   Wt 149 lb (67.6 kg)   SpO2 96%   BMI 26.39 kg/m     Wt Readings from Last 3 Encounters:  10/31/21 149 lb (67.6 kg)  10/18/21 140 lb (63.5 kg)  10/10/21 144 lb 3.2 oz (65.4 kg)     GEN:  Well nourished, well developed in no acute distress HEENT: Normal NECK: No JVD; No carotid bruits LYMPHATICS: No lymphadenopathy CARDIAC: RRR, no murmurs, rubs, gallops RESPIRATORY:  Clear to auscultation without rales, wheezing or rhonchi  ABDOMEN: Soft, non-tender, non-distended MUSCULOSKELETAL:  No edema; No deformity  SKIN: Warm and dry NEUROLOGIC:  Alert and oriented x 3 PSYCHIATRIC:  Normal affect    Signed, Brian Munley, MD  10/31/2021 11:21 AM    [facility]   

## 2021-10-31 ENCOUNTER — Encounter: Payer: Self-pay | Admitting: Cardiology

## 2021-10-31 ENCOUNTER — Ambulatory Visit (INDEPENDENT_AMBULATORY_CARE_PROVIDER_SITE_OTHER): Payer: Medicare Other | Admitting: Cardiology

## 2021-10-31 ENCOUNTER — Other Ambulatory Visit: Payer: Self-pay

## 2021-10-31 VITALS — BP 122/60 | HR 62 | Ht 63.0 in | Wt 149.0 lb

## 2021-10-31 DIAGNOSIS — N184 Chronic kidney disease, stage 4 (severe): Secondary | ICD-10-CM | POA: Diagnosis not present

## 2021-10-31 DIAGNOSIS — I25119 Atherosclerotic heart disease of native coronary artery with unspecified angina pectoris: Secondary | ICD-10-CM | POA: Diagnosis not present

## 2021-10-31 DIAGNOSIS — R252 Cramp and spasm: Secondary | ICD-10-CM | POA: Diagnosis not present

## 2021-10-31 DIAGNOSIS — I10 Essential (primary) hypertension: Secondary | ICD-10-CM | POA: Diagnosis not present

## 2021-10-31 DIAGNOSIS — E785 Hyperlipidemia, unspecified: Secondary | ICD-10-CM | POA: Diagnosis not present

## 2021-10-31 NOTE — Patient Instructions (Addendum)
Medication Instructions:  Your physician recommends that you continue on your current medications as directed. Please refer to the Current Medication list given to you today.  *If you need a refill on your cardiac medications before your next appointment, please call your pharmacy*   Lab Work: None If you have labs (blood work) drawn today and your tests are completely normal, you will receive your results only by: [place] (if you have MyChart) OR A paper copy in the mail If you have any lab test that is abnormal or we need to change your treatment, we will call you to review the results.   Testing/Procedures: None   Follow-Up: At [facility], you and your health needs are our priority.  As part of our continuing mission to provide you with exceptional heart care, we have created designated Provider Care Teams.  These Care Teams include your primary Cardiologist (physician) and Advanced Practice Providers (APPs -  Physician Assistants and Nurse Practitioners) who all work together to provide you with the care you need, when you need it.  We recommend signing up for the patient portal called "MyChart".  Sign up information is provided on this After Visit Summary.  MyChart is used to connect with patients for Virtual Visits (Telemedicine).  Patients are able to view lab/test results, encounter notes, upcoming appointments, etc.  Non-urgent messages can be sent to your provider as well.   To learn more about what you can do with MyChart, go to [email or web address].    Your next appointment:   6 month(s)  The format for your next appointment:   In Person  Provider:   [name], MD    Other Instructions Drink 6-8 ounces of tonic water before bedtime.

## 2021-11-02 ENCOUNTER — Other Ambulatory Visit: Payer: Self-pay | Admitting: Cardiology

## 2021-11-11 ENCOUNTER — Other Ambulatory Visit (INDEPENDENT_AMBULATORY_CARE_PROVIDER_SITE_OTHER): Payer: Medicare Other

## 2021-11-11 ENCOUNTER — Encounter: Payer: Self-pay | Admitting: Gastroenterology

## 2021-11-11 ENCOUNTER — Ambulatory Visit (INDEPENDENT_AMBULATORY_CARE_PROVIDER_SITE_OTHER): Payer: Medicare Other | Admitting: Gastroenterology

## 2021-11-11 ENCOUNTER — Other Ambulatory Visit: Payer: Self-pay

## 2021-11-11 ENCOUNTER — Telehealth: Payer: Self-pay

## 2021-11-11 VITALS — BP 140/80 | HR 64 | Ht 63.0 in | Wt 148.0 lb

## 2021-11-11 DIAGNOSIS — R1032 Left lower quadrant pain: Secondary | ICD-10-CM

## 2021-11-11 DIAGNOSIS — D649 Anemia, unspecified: Secondary | ICD-10-CM

## 2021-11-11 DIAGNOSIS — I25709 Atherosclerosis of coronary artery bypass graft(s), unspecified, with unspecified angina pectoris: Secondary | ICD-10-CM

## 2021-11-11 LAB — CBC WITH DIFFERENTIAL/PLATELET
Basophils Absolute: 0.1 10*3/uL (ref 0.0–0.1)
Basophils Relative: 1 % (ref 0.0–3.0)
Eosinophils Absolute: 0.3 10*3/uL (ref 0.0–0.7)
Eosinophils Relative: 4.6 % (ref 0.0–5.0)
HCT: 27.1 % — ABNORMAL LOW (ref 36.0–46.0)
Hemoglobin: 9.2 g/dL — ABNORMAL LOW (ref 12.0–15.0)
Lymphocytes Relative: 24.4 % (ref 12.0–46.0)
Lymphs Abs: 1.6 10*3/uL (ref 0.7–4.0)
MCHC: 34.1 g/dL (ref 30.0–36.0)
MCV: 90.8 fl (ref 78.0–100.0)
Monocytes Absolute: 0.5 10*3/uL (ref 0.1–1.0)
Monocytes Relative: 8 % (ref 3.0–12.0)
Neutro Abs: 4.1 10*3/uL (ref 1.4–7.7)
Neutrophils Relative %: 62 % (ref 43.0–77.0)
Platelets: 186 10*3/uL (ref 150.0–400.0)
RBC: 2.98 Mil/uL — ABNORMAL LOW (ref 3.87–5.11)
RDW: 14.7 % (ref 11.5–15.5)
WBC: 6.7 10*3/uL (ref 4.0–10.5)

## 2021-11-11 LAB — COMPREHENSIVE METABOLIC PANEL
ALT: 15 U/L (ref 0–35)
AST: 20 U/L (ref 0–37)
Albumin: 4.5 g/dL (ref 3.5–5.2)
Alkaline Phosphatase: 67 U/L (ref 39–117)
BUN: 20 mg/dL (ref 6–23)
CO2: 29 mEq/L (ref 19–32)
Calcium: 9.6 mg/dL (ref 8.4–10.5)
Chloride: 97 mEq/L (ref 96–112)
Creatinine, Ser: 1.06 mg/dL (ref 0.40–1.20)
GFR: 51.91 mL/min — ABNORMAL LOW (ref >60.00)
Glucose, Bld: 95 mg/dL (ref 70–99)
Potassium: 4.6 mEq/L (ref 3.5–5.1)
Sodium: 135 mEq/L (ref 135–145)
Total Bilirubin: 0.4 mg/dL (ref 0.2–1.2)
Total Protein: 7.4 g/dL (ref 6.0–8.3)

## 2021-11-11 LAB — C-REACTIVE PROTEIN: CRP: <1 mg/dL (ref 0.5–20.0)

## 2021-11-11 NOTE — Telephone Encounter (Signed)
I s/w Brooke, CMA with Dr. Lyndel Safe office. I went over the recommendations pt will not be able to come off dual antiplatelet until 10/18/22, due to recent cardiac stents placed. Brooke gave verbal understanding and will let the pt know as well as will update Dr. Lyndel Safe.

## 2021-11-11 NOTE — Progress Notes (Signed)
Chief Complaint: For GI eval.  Referring Provider:  Ronita Hipps, MD      ASSESSMENT AND PLAN;   #1. LLQ pain.  Treated with Cipro/Flagyl for acute diverticulitis 08/2021.  Mild intermittent diarrhea.  #2. CAD s/p stent 10/18/2021 on ASA/plavix  #3. Anemia of chronic dirsase   Plan: -Stool studies for GI Pathogen (includes C. Diff) fecal elastase, fat and Calprotectin. Check Hemoccult at that time. -CBC, CMP, CRP -CT AP with PO contrast only (without IV) -Colon after cardio clearence with miralax (Dr Bettina Gavia, and ?hold plavix, can continue aspirin)    Discussed risks & benefits of colonoscopy. Risks including rare perforation req laparotomy, bleeding after bx/polypectomy req blood transfusion, rarely missing neoplasms, risks of anesthesia/sedation, rare risk of damage to internal organs. Benefits outweigh the risks. Patient agrees to proceed. All the questions were answered. Pt consents to proceed. HPI:    Angela Mccoy is a 75 y.o. female  With CAD s/p recent stenting 10/18/2021 on ASA/planix (followed by Dr. Bettina Gavia, Nl EF), HTN, HLD, CKD4, anemia of chronic disease, anxiety, migraines, insomnia.  With LLQ pain Sept 2022, treated with Cipro/Flagyl for presumed diverticulitis..  Associated diarrhea.  Prior to antibiotics her stool studies were neg.   With continued lower abdominal discomfort, intermittent diarrhea with occasional nocturnal symptoms (Bms 2-3/day, especially after eating, failed trial of Bentyl).  No fever chills or night sweats.  No upper GI symptoms.  No heartburn on Protonix.  No odynophagia or dysphagia.  She had talked to Dr. Bettina Gavia regarding colon at last visit October 31, 2021- OK to undergo colonoscopy per Dr. Bettina Gavia per pt.  I do not see any mention of that in his note.  Hence, we will get cardiology clearance.  No weight loss or loss of appetite.  Past GI procedures: Colonoscopy 10/21/2014 ([facility]): Moderate predominantly sigmoid  diverticulosis.  Small internal hemorrhoids with previous anorectal surgery.  No active bleeding.  EGD 09/2012: neg. Neg SB Bx for celiac.  CT AP 05/2014: neg  S/p cholecystectomy, appendicectomy  Total abdominal hysterectomy 1970  Past Medical History:  Diagnosis Date   Anemia 07/31/2017   Anemia in chronic kidney disease 10/03/2020   Anxiety and depression    Carotid bruit 02/26/2016   Carotid bruit present 02/26/2016   Cavitary pneumonia 03/20/2019   Onset of symptoms around late  Feb 2020 > all symptoms resolved p 5 days Meropenem and rx x 2 weeks then changed to cleocin -  D/c cleocin 03/20/2019  With esr 12, wbc 7,800  -  Quant TB 03/20/2019  Neg  - 05/14/2019 cxr with minimal streaky residual, no as dz    Coronary artery disease involving native coronary artery 02/26/2016   Coronary artery disease involving native coronary artery of native heart with angina pectoris ([place]) 03/12/2016   Overview:  PCI and DES to LAD and RCA in 2003, cath 2010 wo restenosis   Essential hypertension 03/12/2016   GERD (gastroesophageal reflux disease)    High cholesterol 01/17/2017   History of colon polyps    Hx of diverticulitis of colon 09/11/2016   Hyperlipidemia 03/12/2016   Hypertension 01/27/2016   IBS (irritable bowel syndrome)    Insomnia 02/26/2016   Migraine with aura 02/26/2016   Mitral valve prolapse 02/26/2016   Peripheral vascular disease ([place]) 02/26/2016   Persistent atrial fibrillation ([place]) 11/06/2019   Pneumonia of right upper lobe due to infectious organism 02/26/2019   Pre-op evaluation 11/19/2019   Rheumatoid arthritis involving multiple joints ([place])  02/26/2016   Rheumatoid arthritis involving multiple sites ([place]) 02/26/2016    Past Surgical History:  Procedure Laterality Date   ABDOMINAL HYSTERECTOMY     CHOLECYSTECTOMY     COLONOSCOPY  10/21/2014   Moderate predominantly sigmod diverticulosis. Small internal hemorrhoids with evidence of previous anorectal surgery  (no active bleeding)   CORONARY STENT INTERVENTION N/A 10/18/2021   Procedure: CORONARY STENT INTERVENTION;  Surgeon: Leonie Man, MD;  Location: [facility];  Service: Cardiovascular;  Laterality: N/A;   ESOPHAGOGASTRODUODENOSCOPY  09/25/2012   Normal EGD   heart stents     x 4   LEFT HEART CATH AND CORONARY ANGIOGRAPHY N/A 10/18/2021   Procedure: LEFT HEART CATH AND CORONARY ANGIOGRAPHY;  Surgeon: Leonie Man, MD;  Location: [facility];  Service: Cardiovascular;  Laterality: N/A;   rectal fissure      Family History  Problem Relation Age of Onset   Heart attack Mother    Alzheimer's disease Father    Heart attack Sister    Breast cancer Sister    Heart attack Brother    Lung cancer Brother        smoked   Stroke Maternal Grandmother    Asthma Neg Hx    Colon cancer Neg Hx    Rectal cancer Neg Hx    Stomach cancer Neg Hx    Esophageal cancer Neg Hx    Pancreatic cancer Neg Hx    Liver cancer Neg Hx     Social History   Tobacco Use   Smoking status: Former    Packs/day: 1.00    Years: 18.00    Pack years: 18.00    Types: Cigarettes    Quit date: 12/26/1987    Years since quitting: 33.9   Smokeless tobacco: Never  Vaping Use   Vaping Use: Never used  Substance Use Topics   Alcohol use: No   Drug use: Never    Current Outpatient Medications  Medication Sig Dispense Refill   aspirin EC 81 MG tablet Take 1 tablet (81 mg total) by mouth daily. Swallow whole. 30 tablet 11   atorvastatin (LIPITOR) 40 MG tablet Take 1 tablet (40 mg total) by mouth daily. 30 tablet 5   carvedilol (COREG) 6.25 MG tablet TAKE 1 TABLET(6.25 MG) BY MOUTH TWICE DAILY WITH A MEAL 180 tablet 1   chlorthalidone (HYGROTON) 25 MG tablet Take 25 mg by mouth daily.     citalopram (CELEXA) 40 MG tablet Take 40 mg by mouth daily.     clopidogrel (PLAVIX) 75 MG tablet TAKE 1 TABLET(75 MG) BY MOUTH DAILY 90 tablet 3   isosorbide mononitrate (IMDUR) 30 MG 24 hr tablet Take 30 mg  by mouth daily.     Multiple Vitamins-Minerals (MULTIVITAMIN WITH MINERALS) tablet Take 1 tablet by mouth daily.     nitroGLYCERIN (NITROSTAT) 0.4 MG SL tablet ONE TABLET UNDER TONGUE AS NEEDED FOR CHEST PAIN EVERY 5 MINUTES 25 tablet 3   pantoprazole (PROTONIX) 40 MG tablet TAKE 1 TABLET(40 MG) BY MOUTH DAILY 90 tablet 3   potassium chloride SA (KLOR-CON) 20 MEQ tablet Take 20 mEq by mouth 2 (two) times daily.     QUEtiapine (SEROQUEL) 200 MG tablet Take 200 mg by mouth at bedtime.     valsartan (DIOVAN) 80 MG tablet Take 1 tablet (80 mg total) by mouth daily. 90 tablet 3   No current facility-administered medications for this visit.    Allergies  Allergen Reactions   Penicillins  Anaphylaxis    Did it involve swelling of the face/tongue/throat, SOB, or low BP? Yes Did it involve sudden or severe rash/hives, skin peeling, or any reaction on the inside of your mouth or nose? No Did you need to seek medical attention at a hospital or doctor's office? Yes When did it last happen?      2012 If all above answers are "NO", may proceed with cephalosporin use.     Review of Systems:  Constitutional: Denies fever, chills, diaphoresis, appetite change and fatigue.  HEENT: Denies photophobia, eye pain, redness, hearing loss, ear pain, congestion, sore throat, rhinorrhea, sneezing, mouth sores, neck pain, neck stiffness and tinnitus.   Respiratory: Denies SOB, DOE, cough, chest tightness,  and wheezing.   Cardiovascular: Denies chest pain, palpitations and leg swelling.  Genitourinary: Denies dysuria, urgency, frequency, hematuria, flank pain and difficulty urinating.  Musculoskeletal: Denies myalgias, back pain, joint swelling, arthralgias and gait problem.  Skin: No rash.  Neurological: Denies dizziness, seizures, syncope, weakness, light-headedness, numbness and headaches.  Hematological: Denies adenopathy. Easy bruising, personal or family bleeding history  Psychiatric/Behavioral: Has anxiety  or depression     Physical Exam:    BP 140/80   Pulse 64   Ht 5' 3"  (1.6 m)   Wt 148 lb (67.1 kg)   SpO2 97%   BMI 26.22 kg/m  Wt Readings from Last 3 Encounters:  11/11/21 148 lb (67.1 kg)  10/31/21 149 lb (67.6 kg)  10/18/21 140 lb (63.5 kg)   Constitutional:  Well-developed, in no acute distress. Psychiatric: Normal mood and affect. Behavior is normal. HEENT: Pupils normal.  Conjunctivae are normal. No scleral icterus. Neck supple.  Cardiovascular: Normal rate, regular rhythm. No edema Pulmonary/chest: Effort normal and breath sounds normal. No wheezing, rales or rhonchi. Abdominal: Soft, nondistended.  Mildly distended, minimal lower abdominal tenderness.. Bowel sounds active throughout. There are no masses palpable. No hepatomegaly. Rectal: Deferred Neurological: Alert and oriented to person place and time. Skin: Skin is warm and dry. No rashes noted.  Data Reviewed: I have personally reviewed following labs and imaging studies  CBC: CBC Latest Ref Rng & Units 10/20/2021 10/10/2021 09/20/2021  WBC - 7.3 6.4 5.9  Hemoglobin 12.0 - 16.0 10.1(A) 10.3(L) 9.6(A)  Hematocrit 36 - 46 30(A) 30.3(L) 29(A)  Platelets 150 - 399 189 206 184    CMP: CMP Latest Ref Rng & Units 10/10/2021 09/20/2021 03/31/2021  Glucose 70 - 99 mg/dL 103(H) - -  BUN 8 - 27 mg/dL 30(H) 21 31(A)  Creatinine 0.57 - 1.00 mg/dL 1.20(H) 1.2(A) 1.4(A)  Sodium 134 - 144 mmol/L 135 135(A) 138  Potassium 3.5 - 5.2 mmol/L 4.6 4.0 2.8(A)  Chloride 96 - 106 mmol/L 99 99 97(A)  CO2 20 - 29 mmol/L 24 28(A) 28(A)  Calcium 8.7 - 10.3 mg/dL 9.9 9.1 9.6  Total Protein 6.3 - 8.2 g/dL - - 8.6(A)  Total Bilirubin 0.0 - 1.2 mg/dL - - -  Alkaline Phos 25 - 125 - 74 75  AST 13 - 35 - 23 33  ALT 7 - 35 - 12 18     Radiology Studies: CARDIAC CATHETERIZATION  Result Date: 10/18/2021   Ost RCA to Prox RCA stent is 10% in-stent restenosis.   CULPRIT LESION: Prox RCA to Mid RCA lesion is 85% stenosed just beyond the  stent.   After scoring balloon angioplasty, A drug-eluting stent was successfully placed (overlapping previous stent) using a SYNERGY XD 2.75X28.  Postdilated to 3.1 mm   Post intervention,  there is a 5% residual stenosis at the most significant segment, however the remainder has 0% visual stenosis.Marland Kitchen   ------------------------------   Mid LAD to Dist LAD previously placed stent is 10% stenosed (actually focally at small D2).   ------------------------------   The left ventricular systolic function is normal.  The left ventricular ejection fraction is 55-65% by visual estimate.   There is no aortic valve stenosis. SUMMARY Two-vessel CAD: Widely patent LAD stent with maybe 10 to 15% ISR, otherwise normal LCA system with small nondominant LCx Large dominant RCA with proximal stent that has distal edge leading into a native vessel 85% irregular stenosis.  (CULPRIT LESION) Successful Scoring Balloon Angioplasty followed by DES PCI with an overlapping DES stent (2.75 mm x 28 mm postdilated to 3.1 mm) Normal LVEF and EDP. RECOMMENDATIONS Stable for Same-Day PCI. Is already on Plavix -> uninterrupted DAPT now for at least 6 months. Continue aggressive risk factor modification. Follow-up with Dr. Bettina Gavia. [name], MD     Carmell Austria, MD 11/11/2021, 10:26 AM  Cc: Ronita Hipps, MD

## 2021-11-11 NOTE — Telephone Encounter (Signed)
Patient recently had left heart cath with percutaneous coronary invention and placement of drug-eluting stent on 10/18/2021.  He is not eligible until 10/18/2022 to stop his dual antiplatelet therapy.  Interruption of his dual antiplatelet therapy may compromise his recent stenting.  Please contact the requesting office and let them know that he is not eligible for the procedure at this time.  Angela Mccoy. Angela Biela NP-C    11/11/2021, 12:32 PM [address] Office [phone] Fax [phone]

## 2021-11-11 NOTE — Telephone Encounter (Signed)
Patient made aware that we have to cancel her colonoscopy at this time. She will keep her CT and we will proceed from there. She voiced understanding. Dr is aware as well

## 2021-11-11 NOTE — Patient Instructions (Signed)
If you are age 74 or older, your body mass index should be between 23-30. Your Body mass index is 26.22 kg/m. If this is out of the aforementioned range listed, please consider follow up with your Primary Care Provider.  If you are age 23 or younger, your body mass index should be between 19-25. Your Body mass index is 26.22 kg/m. If this is out of the aformentioned range listed, please consider follow up with your Primary Care Provider.   ________________________________________________________  The [facility] providers would like to encourage you to use [facility] to communicate with providers for non-urgent requests or questions.  Due to long hold times on the telephone, sending your provider a message by [facility] may be a faster and more efficient way to get a response.  Please allow 48 business hours for a response.  Please remember that this is for non-urgent requests.  _______________________________________________________   Please go to the lab on the 2nd floor [address] before you leave the office today.   You will be contacted by our office prior to your procedure for directions on holding your Plavix.  If you do not hear from our office 1 week prior to your scheduled procedure, please call [phone] to discuss.   [facility] [place] [address] [address] [facility]   You are scheduled on 11-14-2021  at 1pm. You should arrive 15 minutes prior to your appointment time for registration. Please follow the written instructions below on the day of your exam:  WARNING: IF YOU ARE ALLERGIC TO IODINE/X-RAY DYE, PLEASE NOTIFY RADIOLOGY IMMEDIATELY AT [phone]! YOU WILL BE GIVEN A 13 HOUR PREMEDICATION PREP.  1) Do not eat or drink anything after 9am  (4 hours prior to your test) 2) You have been given 2 bottles of oral contrast to drink. The solution may taste better if refrigerated, but  do NOT add ice or any other liquid to this solution. Shake well before drinking.    Drink 1 bottle of contrast @ 11am (2 hours prior to your exam)  Drink 1 bottle of contrast @ 12pm (1 hour prior to your exam)  You may take any medications as prescribed with a small amount of water, if necessary. If you take any of the following medications: METFORMIN, GLUCOPHAGE, GLUCOVANCE, AVANDAMET, RIOMET, FORTAMET, [place] MET, JANUMET, GLUMETZA or METAGLIP, you MAY be asked to HOLD this medication 48 hours AFTER the exam.  The purpose of you drinking the oral contrast is to aid in the visualization of your intestinal tract. The contrast solution may cause some diarrhea. Depending on your individual set of symptoms, you may also receive an intravenous injection of x-ray contrast/dye. Plan on being at [facility] for 30 minutes or longer, depending on the type of exam you are having performed.  This test typically takes 30-45 minutes to complete.  If you have any questions regarding your exam or if you need to reschedule, you may call the CT department at [phone] between the hours of 8:00 am and 5:00 pm, Monday-Friday.  ________________________________________________________________________  Dennis Bast have been scheduled for a colonoscopy. Please follow written instructions given to you at your visit today.  Please pick up your prep supplies at the pharmacy within the next 1-3 days. If you use inhalers (even only as needed), please bring them with you on the day of your procedure.   Thank you,  Dr.  Jackquline Denmark

## 2021-11-11 NOTE — Telephone Encounter (Signed)
[facility] Pre-operative Risk Assessment     Request for surgical clearance:     Endoscopy Procedure  What type of surgery is being performed?     Colonoscopy  When is this surgery scheduled?     12-07-2021  What type of clearance is required ?   Pharmacy  Are there any medications that need to be held prior to surgery and how long? PLAVIX  5 day hold  Practice name and name of physician performing surgery?      [facility]  What is your office phone and fax number?      Phone- [phone]  [phone]  Anesthesia type (None, local, MAC, general) ?       MAC

## 2021-11-14 ENCOUNTER — Ambulatory Visit (HOSPITAL_BASED_OUTPATIENT_CLINIC_OR_DEPARTMENT_OTHER)
Admission: RE | Admit: 2021-11-14 | Discharge: 2021-11-14 | Disposition: A | Discharge status: Home / Self Care | Payer: Medicare Other | Source: Ambulatory Visit | Attending: Gastroenterology | Admitting: Gastroenterology

## 2021-11-14 ENCOUNTER — Other Ambulatory Visit: Payer: Self-pay

## 2021-11-14 ENCOUNTER — Other Ambulatory Visit (INDEPENDENT_AMBULATORY_CARE_PROVIDER_SITE_OTHER): Payer: Medicare Other

## 2021-11-14 DIAGNOSIS — R1032 Left lower quadrant pain: Secondary | ICD-10-CM

## 2021-11-14 DIAGNOSIS — D649 Anemia, unspecified: Secondary | ICD-10-CM

## 2021-11-14 DIAGNOSIS — I700 Atherosclerosis of aorta: Secondary | ICD-10-CM | POA: Diagnosis not present

## 2021-11-14 DIAGNOSIS — R110 Nausea: Secondary | ICD-10-CM | POA: Diagnosis not present

## 2021-11-14 DIAGNOSIS — I25709 Atherosclerosis of coronary artery bypass graft(s), unspecified, with unspecified angina pectoris: Secondary | ICD-10-CM

## 2021-11-14 DIAGNOSIS — R197 Diarrhea, unspecified: Secondary | ICD-10-CM | POA: Diagnosis not present

## 2021-11-14 DIAGNOSIS — K5730 Diverticulosis of large intestine without perforation or abscess without bleeding: Secondary | ICD-10-CM | POA: Diagnosis not present

## 2021-11-15 ENCOUNTER — Other Ambulatory Visit: Payer: Medicare Other

## 2021-11-15 DIAGNOSIS — D649 Anemia, unspecified: Secondary | ICD-10-CM

## 2021-11-15 DIAGNOSIS — I25709 Atherosclerosis of coronary artery bypass graft(s), unspecified, with unspecified angina pectoris: Secondary | ICD-10-CM

## 2021-11-15 DIAGNOSIS — R1032 Left lower quadrant pain: Secondary | ICD-10-CM

## 2021-11-15 LAB — HEMOCCULT SLIDES (X 3 CARDS)
Fecal Occult Blood: NEGATIVE
OCCULT 1: NEGATIVE
OCCULT 2: NEGATIVE
OCCULT 3: NEGATIVE
OCCULT 4: NEGATIVE
OCCULT 5: NEGATIVE

## 2021-11-18 LAB — GI PROFILE, STOOL, PCR

## 2021-11-18 LAB — CALPROTECTIN, FECAL: Calprotectin, Fecal: 61 ug/g (ref 0–120)

## 2021-11-21 ENCOUNTER — Inpatient Hospital Stay: Payer: Medicare Other | Attending: Oncology

## 2021-11-21 ENCOUNTER — Encounter: Payer: Self-pay | Admitting: Oncology

## 2021-11-21 ENCOUNTER — Other Ambulatory Visit: Payer: Self-pay

## 2021-11-21 DIAGNOSIS — D631 Anemia in chronic kidney disease: Secondary | ICD-10-CM | POA: Diagnosis not present

## 2021-11-21 DIAGNOSIS — D649 Anemia, unspecified: Secondary | ICD-10-CM | POA: Diagnosis not present

## 2021-11-21 DIAGNOSIS — N189 Chronic kidney disease, unspecified: Secondary | ICD-10-CM | POA: Diagnosis not present

## 2021-11-21 LAB — BASIC METABOLIC PANEL
BUN: 18 (ref 4–21)
CO2: 25 — AB (ref 13–22)
Chloride: 100 (ref 99–108)
Creatinine: 1.1 (ref 0.5–1.1)
Glucose: 127
Potassium: 4 (ref 3.4–5.3)
Sodium: 134 — AB (ref 137–147)

## 2021-11-21 LAB — CBC AND DIFFERENTIAL
HCT: 27 — AB (ref 36–46)
Hemoglobin: 9 — AB (ref 12.0–16.0)
Neutrophils Absolute: 4.42
Platelets: 175 (ref 150–399)
WBC: 6.8

## 2021-11-21 LAB — FECAL FAT, QUALITATIVE: FECAL FAT, QUALITATIVE: NORMAL

## 2021-11-21 LAB — COMPREHENSIVE METABOLIC PANEL
Albumin: 4.2 (ref 3.5–5.0)
Calcium: 9 (ref 8.7–10.7)

## 2021-11-21 LAB — CBC: RBC: 3.03 — AB (ref 3.87–5.11)

## 2021-11-21 LAB — HEPATIC FUNCTION PANEL
ALT: 20 (ref 7–35)
AST: 26 (ref 13–35)
Alkaline Phosphatase: 69 (ref 25–125)
Bilirubin, Total: 0.3

## 2021-11-21 LAB — CLOSTRIDIUM DIFFICILE TOXIN B, QUALITATIVE, REAL-TIME PCR: Toxigenic C. Difficile by PCR: NOT DETECTED

## 2021-11-21 LAB — PANCREATIC ELASTASE, FECAL: Pancreatic Elastase-1, Stool: >500 mcg/g

## 2021-12-07 ENCOUNTER — Encounter: Payer: Medicare Other | Admitting: Gastroenterology

## 2021-12-15 ENCOUNTER — Ambulatory Visit: Payer: Medicare Other | Admitting: Cardiology

## 2021-12-20 ENCOUNTER — Inpatient Hospital Stay: Payer: Medicare Other

## 2021-12-20 ENCOUNTER — Telehealth: Payer: Self-pay | Admitting: Hematology and Oncology

## 2021-12-20 ENCOUNTER — Inpatient Hospital Stay: Payer: Medicare Other | Attending: Oncology | Admitting: Hematology and Oncology

## 2021-12-20 ENCOUNTER — Encounter: Payer: Self-pay | Admitting: Hematology and Oncology

## 2021-12-20 ENCOUNTER — Other Ambulatory Visit: Payer: Self-pay | Admitting: Hematology and Oncology

## 2021-12-20 DIAGNOSIS — N1832 Chronic kidney disease, stage 3b: Secondary | ICD-10-CM | POA: Diagnosis not present

## 2021-12-20 DIAGNOSIS — N189 Chronic kidney disease, unspecified: Secondary | ICD-10-CM

## 2021-12-20 DIAGNOSIS — N183 Chronic kidney disease, stage 3 unspecified: Secondary | ICD-10-CM | POA: Diagnosis not present

## 2021-12-20 DIAGNOSIS — D631 Anemia in chronic kidney disease: Secondary | ICD-10-CM | POA: Insufficient documentation

## 2021-12-20 DIAGNOSIS — D649 Anemia, unspecified: Secondary | ICD-10-CM | POA: Diagnosis not present

## 2021-12-20 LAB — BASIC METABOLIC PANEL
BUN: 37 — AB (ref 4–21)
CO2: 27 — AB (ref 13–22)
Chloride: 104 (ref 99–108)
Creatinine: 1.5 — AB (ref 0.5–1.1)
Glucose: 98
Potassium: 4.1 (ref 3.4–5.3)
Sodium: 138 (ref 137–147)

## 2021-12-20 LAB — IRON AND TIBC
Iron: 55 ug/dL (ref 28–170)
Saturation Ratios: 16 % (ref 10.4–31.8)
TIBC: 336 ug/dL (ref 250–450)
UIBC: 281 ug/dL

## 2021-12-20 LAB — CBC AND DIFFERENTIAL
HCT: 27 — AB (ref 36–46)
Hemoglobin: 9.3 — AB (ref 12.0–16.0)
Neutrophils Absolute: 4.68
Platelets: 200 (ref 150–399)
WBC: 7.2

## 2021-12-20 LAB — HEPATIC FUNCTION PANEL
ALT: 20 (ref 7–35)
AST: 31 (ref 13–35)
Alkaline Phosphatase: 82 (ref 25–125)
Bilirubin, Total: 0.5

## 2021-12-20 LAB — COMPREHENSIVE METABOLIC PANEL
Albumin: 4.4 (ref 3.5–5.0)
Calcium: 9.6 (ref 8.7–10.7)

## 2021-12-20 LAB — CBC: RBC: 3.04 — AB (ref 3.87–5.11)

## 2021-12-20 LAB — FERRITIN: Ferritin: 36 ng/mL (ref 11–307)

## 2021-12-20 NOTE — Telephone Encounter (Signed)
12/20/21 Per 12/27 los all appts scheduled and given to patient

## 2021-12-20 NOTE — Assessment & Plan Note (Addendum)
75 year old female with anemia secondary to renal insufficiency.  As her hemoglobin remains below 10, I will arrange for her to receive Retacrit 20,000 units this week.  Her CBC will be checked monthly to ensure her hemoglobin is trending in the right direction.  I will see her back in 3 months for repeat clinical assessment.

## 2021-12-20 NOTE — Progress Notes (Cosign Needed)
[facility] [place]  [address] [place],  [place]  [address] [phone]  Clinic Day:  12/20/2021  Referring physician: Ronita Hipps, MD  ASSESSMENT & PLAN:   Assessment & Plan: Anemia in chronic kidney disease 75 year old female with anemia secondary to renal insufficiency.  As her hemoglobin remains below 10, I will arrange for her to receive Retacrit 20,000 units this week.  Her CBC will be checked monthly to ensure her hemoglobin is trending in the right direction.  I will see her back in 3 months for repeat clinical assessment.     The patient understands the plans discussed today and is in agreement with them.  She knows to contact our office if she develops concerns prior to her next appointment.    Angela Pickles, PA-C  [facility] [address] [place] [address] Dept: [phone] Dept Fax: [phone]   No orders of the defined types were placed in this encounter.     CHIEF COMPLAINT:  CC: Anemia of chronic kidney disease  Current Treatment: Epoetin as needed   HISTORY OF PRESENT ILLNESS:  The patient is a 75 year old female with anemia secondary to chronic renal insufficiency.  She receives red cell shot therapy intermittently, which has been effective in keeping her hemoglobin at or above 10.  Her last injection was in September.  Epoetin was held in October as her hemoglobin was above 10.  Her hemoglobin was 9 in November, but apparently she was not scheduled for an injection.   INTERVAL HISTORY:  Angela Mccoy is here today for repeat clinical assessment.  Since her last visit, she had another cardiac catheterization with coronary artery stent placement.  She denies chest pain.  She states she remains fatigued with occasional dyspnea with exertion and occasional lightheadedness.   She denies fevers or chills. She denies pain. Her appetite is good. Her weight has been  stable.  REVIEW OF SYSTEMS:  Review of Systems  Constitutional:  Positive for fatigue. Negative for appetite change, chills, fever and unexpected weight change.  HENT:   Negative for lump/mass, mouth sores and sore throat.   Respiratory:  Positive for shortness of breath (occasionally with exertion). Negative for cough.   Cardiovascular:  Negative for chest pain and leg swelling.  Gastrointestinal:  Negative for abdominal pain, constipation, diarrhea, nausea and vomiting.  Endocrine: Negative for hot flashes.  Genitourinary:  Negative for difficulty urinating, dysuria, frequency and hematuria.   Musculoskeletal:  Negative for arthralgias, back pain and myalgias.  Skin:  Negative for rash.  Neurological:  Positive for light-headedness (occasionally with standing). Negative for dizziness and headaches.  Hematological:  Negative for adenopathy. Does not bruise/bleed easily.  Psychiatric/Behavioral:  Negative for depression and sleep disturbance. The patient is not nervous/anxious.     VITALS:  Blood pressure (!) 157/65, pulse 69, temperature 98.5 F (36.9 C), resp. rate 16, height _0  (1.6 m), weight 148 lb 8 oz (67.4 kg), SpO2 98 %.  Wt Readings from Last 3 Encounters:  12/20/21 148 lb 8 oz (67.4 kg)  11/11/21 148 lb (67.1 kg)  10/31/21 149 lb (67.6 kg)    Body mass index is 26.31 kg/m.  Performance status (ECOG): 1 - Symptomatic but completely ambulatory  PHYSICAL EXAM:  Physical Exam Vitals and nursing note reviewed.  Constitutional:      General: She is not in acute distress.    Appearance: Normal appearance.  HENT:  Head: Normocephalic and atraumatic.     Mouth/Throat:     Mouth: Mucous membranes are moist.     Pharynx: Oropharynx is clear. No oropharyngeal exudate or posterior oropharyngeal erythema.  Eyes:     General: No scleral icterus.    Extraocular Movements: Extraocular movements intact.     Conjunctiva/sclera: Conjunctivae normal.     Pupils: Pupils are  equal, round, and reactive to light.  Cardiovascular:     Rate and Rhythm: Normal rate and regular rhythm.     Heart sounds: Normal heart sounds. No murmur heard.   No friction rub. No gallop.  Pulmonary:     Effort: Pulmonary effort is normal.     Breath sounds: Normal breath sounds. No wheezing, rhonchi or rales.  Abdominal:     General: There is no distension.     Palpations: Abdomen is soft. There is no hepatomegaly, splenomegaly or mass.     Tenderness: There is no abdominal tenderness.  Musculoskeletal:        General: Normal range of motion.     Cervical back: Normal range of motion and neck supple. No tenderness.     Right lower leg: No edema.     Left lower leg: No edema.  Lymphadenopathy:     Cervical: No cervical adenopathy.     Upper Body:     Right upper body: No supraclavicular or axillary adenopathy.     Left upper body: No supraclavicular or axillary adenopathy.     Lower Body: No right inguinal adenopathy. No left inguinal adenopathy.  Skin:    General: Skin is warm and dry.     Coloration: Skin is not jaundiced.     Findings: No rash.  Neurological:     Mental Status: She is alert and oriented to person, place, and time.     Cranial Nerves: No cranial nerve deficit.  Psychiatric:        Mood and Affect: Mood normal.        Behavior: Behavior normal.        Thought Content: Thought content normal.    LABS:   CBC Latest Ref Rng & Units 12/20/2021 11/21/2021 11/11/2021  WBC - 7.2 6.8 6.7  Hemoglobin 12.0 - 16.0 9.3(A) 9.0(A) 9.2(L)  Hematocrit 36 - 46 27(A) 27(A) 27.1(L)  Platelets 150 - 399 200 175 186.0   CMP Latest Ref Rng & Units 12/20/2021 11/21/2021 11/11/2021  Glucose 70 - 99 mg/dL - - 95  BUN 4 - 21 37(A) 18 20  Creatinine 0.5 - 1.1 1.5(A) 1.1 1.06  Sodium 137 - 147 138 134(A) 135  Potassium 3.4 - 5.3 4.1 4.0 4.6  Chloride 99 - 108 104 100 97  CO2 13 - 22 27(A) 25(A) 29  Calcium 8.7 - 10.7 9.6 9.0 9.6  Total Protein 6.0 - 8.3 g/dL - - 7.4   Total Bilirubin 0.2 - 1.2 mg/dL - - 0.4  Alkaline Phos 25 - 125 82 69 67  AST 13 - 35 _0 ALT 7 - 35 _1 No results found for: CEA1 / No results found for: CEA1 No results found for: PSA1 No results found for: EYE233 No results found for: CAN125  No results found for: Ronnald Ramp, A1GS, A2GS, BETS, BETA2SER, GAMS, MSPIKE, SPEI Lab Results  Component Value Date   TIBC 333 09/20/2021   TIBC 373 03/31/2021   TIBC 311 02/21/2021   FERRITIN 79 09/20/2021   FERRITIN 99 03/31/2021  FERRITIN 101.0 02/21/2021   IRONPCTSAT 19 09/20/2021   IRONPCTSAT 23 03/31/2021   IRONPCTSAT 23.4 02/21/2021   No results found for: LDH  STUDIES:  No results found.    HISTORY:   Past Medical History:  Diagnosis Date   Anemia 07/31/2017   Anemia in chronic kidney disease 10/03/2020   Anxiety and depression    Carotid bruit 02/26/2016   Carotid bruit present 02/26/2016   Cavitary pneumonia 03/20/2019   Onset of symptoms around late  Feb 2020 > all symptoms resolved p 5 days Meropenem and rx x 2 weeks then changed to cleocin -  D/c cleocin 03/20/2019  With esr 12, wbc 7,800  -  Quant TB 03/20/2019  Neg  - 05/14/2019 cxr with minimal streaky residual, no as dz    Coronary artery disease involving native coronary artery 02/26/2016   Coronary artery disease involving native coronary artery of native heart with angina pectoris ([place]) 03/12/2016   Overview:  PCI and DES to LAD and RCA in 2003, cath 2010 wo restenosis   Essential hypertension 03/12/2016   GERD (gastroesophageal reflux disease)    High cholesterol 01/17/2017   History of colon polyps    Hx of diverticulitis of colon 09/11/2016   Hyperlipidemia 03/12/2016   Hypertension 01/27/2016   IBS (irritable bowel syndrome)    Insomnia 02/26/2016   Migraine with aura 02/26/2016   Mitral valve prolapse 02/26/2016   Peripheral vascular disease ([place]) 02/26/2016   Persistent atrial fibrillation ([place]) 11/06/2019    Pneumonia of right upper lobe due to infectious organism 02/26/2019   Pre-op evaluation 11/19/2019   Rheumatoid arthritis involving multiple joints ([place]) 02/26/2016   Rheumatoid arthritis involving multiple sites ([place]) 02/26/2016    Past Surgical History:  Procedure Laterality Date   ABDOMINAL HYSTERECTOMY     CHOLECYSTECTOMY     COLONOSCOPY  10/21/2014   Moderate predominantly sigmod diverticulosis. Small internal hemorrhoids with evidence of previous anorectal surgery (no active bleeding)   CORONARY STENT INTERVENTION N/A 10/18/2021   Procedure: CORONARY STENT INTERVENTION;  Surgeon: Leonie Man, MD;  Location: [facility];  Service: Cardiovascular;  Laterality: N/A;   ESOPHAGOGASTRODUODENOSCOPY  09/25/2012   Normal EGD   heart stents     x 4   LEFT HEART CATH AND CORONARY ANGIOGRAPHY N/A 10/18/2021   Procedure: LEFT HEART CATH AND CORONARY ANGIOGRAPHY;  Surgeon: Leonie Man, MD;  Location: [facility];  Service: Cardiovascular;  Laterality: N/A;   rectal fissure      Family History  Problem Relation Age of Onset   Heart attack Mother    Alzheimer's disease Father    Heart attack Sister    Breast cancer Sister    Heart attack Brother    Lung cancer Brother        smoked   Stroke Maternal Grandmother    Asthma Neg Hx    Colon cancer Neg Hx    Rectal cancer Neg Hx    Stomach cancer Neg Hx    Esophageal cancer Neg Hx    Pancreatic cancer Neg Hx    Liver cancer Neg Hx     Social History:  reports that she quit smoking about 34 years ago. Her smoking use included cigarettes. She has a 18.00 pack-year smoking history. She has never used smokeless tobacco. She reports that she does not drink alcohol and does not use drugs.The patient is alone today.  Allergies:  Allergies  Allergen Reactions   Penicillins Anaphylaxis  Did it involve swelling of the face/tongue/throat, SOB, or low BP? Yes Did it involve sudden or severe rash/hives, skin peeling, or  any reaction on the inside of your mouth or nose? No Did you need to seek medical attention at a hospital or doctor's office? Yes When did it last happen?      2012 If all above answers are NO, may proceed with cephalosporin use.     Current Medications: Current Outpatient Medications  Medication Sig Dispense Refill   aspirin EC 81 MG tablet Take 1 tablet (81 mg total) by mouth daily. Swallow whole. 30 tablet 11   atorvastatin (LIPITOR) 40 MG tablet Take 1 tablet (40 mg total) by mouth daily. 30 tablet 5   carvedilol (COREG) 6.25 MG tablet TAKE 1 TABLET(6.25 MG) BY MOUTH TWICE DAILY WITH A MEAL 180 tablet 1   chlorthalidone (HYGROTON) 25 MG tablet Take 25 mg by mouth daily.     citalopram (CELEXA) 40 MG tablet Take 40 mg by mouth daily.     clopidogrel (PLAVIX) 75 MG tablet TAKE 1 TABLET(75 MG) BY MOUTH DAILY 90 tablet 3   isosorbide mononitrate (IMDUR) 30 MG 24 hr tablet Take 30 mg by mouth daily.     Multiple Vitamins-Minerals (MULTIVITAMIN WITH MINERALS) tablet Take 1 tablet by mouth daily.     nitroGLYCERIN (NITROSTAT) 0.4 MG SL tablet ONE TABLET UNDER TONGUE AS NEEDED FOR CHEST PAIN EVERY 5 MINUTES 25 tablet 3   pantoprazole (PROTONIX) 40 MG tablet TAKE 1 TABLET(40 MG) BY MOUTH DAILY 90 tablet 3   potassium chloride SA (KLOR-CON) 20 MEQ tablet Take 20 mEq by mouth 2 (two) times daily.     QUEtiapine (SEROQUEL) 200 MG tablet Take 200 mg by mouth at bedtime.     valsartan (DIOVAN) 80 MG tablet Take 1 tablet (80 mg total) by mouth daily. 90 tablet 3   No current facility-administered medications for this visit.

## 2021-12-21 ENCOUNTER — Inpatient Hospital Stay: Payer: Medicare Other

## 2021-12-21 ENCOUNTER — Other Ambulatory Visit: Payer: Self-pay

## 2021-12-21 ENCOUNTER — Encounter: Payer: Self-pay | Admitting: Oncology

## 2021-12-21 VITALS — BP 144/67 | HR 66 | Temp 97.9°F | Resp 18 | Ht 63.0 in | Wt 148.2 lb

## 2021-12-21 DIAGNOSIS — N1832 Chronic kidney disease, stage 3b: Secondary | ICD-10-CM | POA: Diagnosis not present

## 2021-12-21 DIAGNOSIS — D631 Anemia in chronic kidney disease: Secondary | ICD-10-CM | POA: Diagnosis not present

## 2021-12-21 DIAGNOSIS — N183 Chronic kidney disease, stage 3 unspecified: Secondary | ICD-10-CM

## 2021-12-21 MED ORDER — EPOETIN ALFA-EPBX 10000 UNIT/ML IJ SOLN
10000.0000 [IU] | Freq: Once | INTRAMUSCULAR | Status: AC
  Administered 2021-12-21: 10000 [IU] via SUBCUTANEOUS
  Filled 2021-12-21: qty 1

## 2021-12-21 NOTE — Addendum Note (Signed)
Addended by: Juanetta Beets on: 12/21/2021 09:44 AM   Modules accepted: Orders

## 2021-12-21 NOTE — Patient Instructions (Signed)
Epoetin Alfa injection °What is this medication? °EPOETIN ALFA (e POE e tin AL fa) helps your body make more red blood cells. This medicine is used to treat anemia caused by chronic kidney disease, cancer chemotherapy, or HIV-therapy. It may also be used before surgery if you have anemia. °This medicine may be used for other purposes; ask your health care provider or pharmacist if you have questions. °COMMON BRAND NAME(S): Epogen, Procrit, Retacrit °What should I tell my care team before I take this medication? °They need to know if you have any of these conditions: °cancer °heart disease °high blood pressure °history of blood clots °history of stroke °low levels of folate, iron, or vitamin B12 in the blood °seizures °an unusual or allergic reaction to erythropoietin, albumin, benzyl alcohol, hamster proteins, other medicines, foods, dyes, or preservatives °pregnant or trying to get pregnant °breast-feeding °How should I use this medication? °This medicine is for injection into a vein or under the skin. It is usually given by a health care professional in a hospital or clinic setting. °If you get this medicine at home, you will be taught how to prepare and give this medicine. Use exactly as directed. Take your medicine at regular intervals. Do not take your medicine more often than directed. °It is important that you put your used needles and syringes in a special sharps container. Do not put them in a trash can. If you do not have a sharps container, call your pharmacist or healthcare provider to get one. °A special MedGuide will be given to you by the pharmacist with each prescription and refill. Be sure to read this information carefully each time. °Talk to your pediatrician regarding the use of this medicine in children. While this drug may be prescribed for selected conditions, precautions do apply. °Overdosage: If you think you have taken too much of this medicine contact a poison control center or emergency  room at once. °NOTE: This medicine is only for you. Do not share this medicine with others. °What if I miss a dose? °If you miss a dose, take it as soon as you can. If it is almost time for your next dose, take only that dose. Do not take double or extra doses. °What may interact with this medication? °Interactions have not been studied. °This list may not describe all possible interactions. Give your health care provider a list of all the medicines, herbs, non-prescription drugs, or dietary supplements you use. Also tell them if you smoke, drink alcohol, or use illegal drugs. Some items may interact with your medicine. °What should I watch for while using this medication? °Your condition will be monitored carefully while you are receiving this medicine. °You may need blood work done while you are taking this medicine. °This medicine may cause a decrease in vitamin B6. You should make sure that you get enough vitamin B6 while you are taking this medicine. Discuss the foods you eat and the vitamins you take with your health care professional. °What side effects may I notice from receiving this medication? °Side effects that you should report to your doctor or health care professional as soon as possible: °allergic reactions like skin rash, itching or hives, swelling of the face, lips, or tongue °seizures °signs and symptoms of a blood clot such as breathing problems; changes in vision; chest pain; severe, sudden headache; pain, swelling, warmth in the leg; trouble speaking; sudden numbness or weakness of the face, arm or leg °signs and symptoms of a stroke like   changes in vision; confusion; trouble speaking or understanding; severe headaches; sudden numbness or weakness of the face, arm or leg; trouble walking; dizziness; loss of balance or coordination °Side effects that usually do not require medical attention (report to your doctor or health care professional if they continue or are  bothersome): °chills °cough °dizziness °fever °headaches °joint pain °muscle cramps °muscle pain °nausea, vomiting °pain, redness, or irritation at site where injected °This list may not describe all possible side effects. Call your doctor for medical advice about side effects. You may report side effects to FDA at 1-800-FDA-1088. °Where should I keep my medication? °Keep out of the reach of children. °Store in a refrigerator between 2 and 8 degrees C (36 and 46 degrees F). Do not freeze or shake. Throw away any unused portion if using a single-dose vial. Multi-dose vials can be kept in the refrigerator for up to 21 days after the initial dose. Throw away unused medicine. °NOTE: This sheet is a summary. It may not cover all possible information. If you have questions about this medicine, talk to your doctor, pharmacist, or health care provider. °© 2022 Elsevier/Gold Standard (2017-08-14 00:00:00) ° °

## 2021-12-22 ENCOUNTER — Encounter: Payer: Self-pay | Admitting: Oncology

## 2021-12-22 NOTE — Addendum Note (Signed)
Addended by: Juanetta Beets on: 12/22/2021 03:10 PM   Modules accepted: Orders

## 2022-01-05 DIAGNOSIS — G43909 Migraine, unspecified, not intractable, without status migrainosus: Secondary | ICD-10-CM | POA: Diagnosis not present

## 2022-01-05 DIAGNOSIS — F419 Anxiety disorder, unspecified: Secondary | ICD-10-CM | POA: Diagnosis not present

## 2022-01-05 DIAGNOSIS — K589 Irritable bowel syndrome without diarrhea: Secondary | ICD-10-CM | POA: Diagnosis not present

## 2022-01-05 DIAGNOSIS — Z6824 Body mass index (BMI) 24.0-24.9, adult: Secondary | ICD-10-CM | POA: Diagnosis not present

## 2022-01-16 ENCOUNTER — Other Ambulatory Visit: Payer: Self-pay | Admitting: Hematology and Oncology

## 2022-01-16 ENCOUNTER — Inpatient Hospital Stay: Payer: Medicare Other | Attending: Oncology

## 2022-01-16 ENCOUNTER — Other Ambulatory Visit: Payer: Self-pay

## 2022-01-16 DIAGNOSIS — D631 Anemia in chronic kidney disease: Secondary | ICD-10-CM | POA: Insufficient documentation

## 2022-01-16 DIAGNOSIS — N1832 Chronic kidney disease, stage 3b: Secondary | ICD-10-CM | POA: Insufficient documentation

## 2022-01-16 DIAGNOSIS — D649 Anemia, unspecified: Secondary | ICD-10-CM | POA: Diagnosis not present

## 2022-01-16 LAB — IRON AND TIBC
Iron: 49 ug/dL (ref 28–170)
Saturation Ratios: 14 % (ref 10.4–31.8)
TIBC: 352 ug/dL (ref 250–450)
UIBC: 303 ug/dL

## 2022-01-16 LAB — CBC AND DIFFERENTIAL
HCT: 25 — AB (ref 36–46)
Hemoglobin: 8.5 — AB (ref 12.0–16.0)
Neutrophils Absolute: 3.02
Platelets: 178 (ref 150–399)
WBC: 5.2

## 2022-01-16 LAB — CBC
MCV: 89 (ref 81–99)
RBC: 2.83 — AB (ref 3.87–5.11)

## 2022-01-16 LAB — FERRITIN: Ferritin: 36 ng/mL (ref 11–307)

## 2022-01-18 ENCOUNTER — Inpatient Hospital Stay: Payer: Medicare Other

## 2022-01-18 ENCOUNTER — Other Ambulatory Visit: Payer: Self-pay

## 2022-01-18 VITALS — BP 112/55 | HR 78 | Temp 97.9°F | Resp 18 | Wt 151.0 lb

## 2022-01-18 DIAGNOSIS — N183 Chronic kidney disease, stage 3 unspecified: Secondary | ICD-10-CM

## 2022-01-18 DIAGNOSIS — N1832 Chronic kidney disease, stage 3b: Secondary | ICD-10-CM | POA: Diagnosis not present

## 2022-01-18 DIAGNOSIS — D631 Anemia in chronic kidney disease: Secondary | ICD-10-CM | POA: Diagnosis not present

## 2022-01-18 MED ORDER — EPOETIN ALFA-EPBX 10000 UNIT/ML IJ SOLN
10000.0000 [IU] | Freq: Once | INTRAMUSCULAR | Status: AC
  Administered 2022-01-18: 10000 [IU] via SUBCUTANEOUS
  Filled 2022-01-18: qty 1

## 2022-01-18 NOTE — Patient Instructions (Signed)
Epoetin Alfa injection °What is this medication? °EPOETIN ALFA (e POE e tin AL fa) helps your body make more red blood cells. This medicine is used to treat anemia caused by chronic kidney disease, cancer chemotherapy, or HIV-therapy. It may also be used before surgery if you have anemia. °This medicine may be used for other purposes; ask your health care provider or pharmacist if you have questions. °COMMON BRAND NAME(S): Epogen, Procrit, Retacrit °What should I tell my care team before I take this medication? °They need to know if you have any of these conditions: °cancer °heart disease °high blood pressure °history of blood clots °history of stroke °low levels of folate, iron, or vitamin B12 in the blood °seizures °an unusual or allergic reaction to erythropoietin, albumin, benzyl alcohol, hamster proteins, other medicines, foods, dyes, or preservatives °pregnant or trying to get pregnant °breast-feeding °How should I use this medication? °This medicine is for injection into a vein or under the skin. It is usually given by a health care professional in a hospital or clinic setting. °If you get this medicine at home, you will be taught how to prepare and give this medicine. Use exactly as directed. Take your medicine at regular intervals. Do not take your medicine more often than directed. °It is important that you put your used needles and syringes in a special sharps container. Do not put them in a trash can. If you do not have a sharps container, call your pharmacist or healthcare provider to get one. °A special MedGuide will be given to you by the pharmacist with each prescription and refill. Be sure to read this information carefully each time. °Talk to your pediatrician regarding the use of this medicine in children. While this drug may be prescribed for selected conditions, precautions do apply. °Overdosage: If you think you have taken too much of this medicine contact a poison control center or emergency  room at once. °NOTE: This medicine is only for you. Do not share this medicine with others. °What if I miss a dose? °If you miss a dose, take it as soon as you can. If it is almost time for your next dose, take only that dose. Do not take double or extra doses. °What may interact with this medication? °Interactions have not been studied. °This list may not describe all possible interactions. Give your health care provider a list of all the medicines, herbs, non-prescription drugs, or dietary supplements you use. Also tell them if you smoke, drink alcohol, or use illegal drugs. Some items may interact with your medicine. °What should I watch for while using this medication? °Your condition will be monitored carefully while you are receiving this medicine. °You may need blood work done while you are taking this medicine. °This medicine may cause a decrease in vitamin B6. You should make sure that you get enough vitamin B6 while you are taking this medicine. Discuss the foods you eat and the vitamins you take with your health care professional. °What side effects may I notice from receiving this medication? °Side effects that you should report to your doctor or health care professional as soon as possible: °allergic reactions like skin rash, itching or hives, swelling of the face, lips, or tongue °seizures °signs and symptoms of a blood clot such as breathing problems; changes in vision; chest pain; severe, sudden headache; pain, swelling, warmth in the leg; trouble speaking; sudden numbness or weakness of the face, arm or leg °signs and symptoms of a stroke like   changes in vision; confusion; trouble speaking or understanding; severe headaches; sudden numbness or weakness of the face, arm or leg; trouble walking; dizziness; loss of balance or coordination °Side effects that usually do not require medical attention (report to your doctor or health care professional if they continue or are  bothersome): °chills °cough °dizziness °fever °headaches °joint pain °muscle cramps °muscle pain °nausea, vomiting °pain, redness, or irritation at site where injected °This list may not describe all possible side effects. Call your doctor for medical advice about side effects. You may report side effects to FDA at 1-800-FDA-1088. °Where should I keep my medication? °Keep out of the reach of children. °Store in a refrigerator between 2 and 8 degrees C (36 and 46 degrees F). Do not freeze or shake. Throw away any unused portion if using a single-dose vial. Multi-dose vials can be kept in the refrigerator for up to 21 days after the initial dose. Throw away unused medicine. °NOTE: This sheet is a summary. It may not cover all possible information. If you have questions about this medicine, talk to your doctor, pharmacist, or health care provider. °© 2022 Elsevier/Gold Standard (2017-08-14 00:00:00) ° °

## 2022-01-20 ENCOUNTER — Other Ambulatory Visit: Payer: Medicare Other

## 2022-01-23 ENCOUNTER — Telehealth: Payer: Self-pay | Admitting: Cardiology

## 2022-01-23 NOTE — Telephone Encounter (Signed)
Pt c/o medication issue:  1. Name of Medication: atorvastatin (LIPITOR) 40 MG tablet  2. How are you currently taking this medication (dosage and times per day)? Patient has not taken for 3 days  3. Are you having a reaction (difficulty breathing--STAT)? Leg pain and numbness in hands and feet.   4. What is your medication issue? Patient wanted to go back on pravastatin (PRAVACHOL) 40 MG tablet Patient did not experience pain on the other medication

## 2022-01-24 MED ORDER — PRAVASTATIN SODIUM 40 MG PO TABS: 40.0000 mg | ORAL_TABLET | Freq: Every evening | ORAL | 3 refills | Status: DC

## 2022-01-24 NOTE — Telephone Encounter (Signed)
Spoke to the patient and her husband just now and let them know Dr. Joya Gaskins recommendations. They verbalize understanding and thank me for calling them back.    Encouraged patient to call back with any questions or concerns.

## 2022-02-06 DIAGNOSIS — Z6826 Body mass index (BMI) 26.0-26.9, adult: Secondary | ICD-10-CM | POA: Diagnosis not present

## 2022-02-06 DIAGNOSIS — Z1331 Encounter for screening for depression: Secondary | ICD-10-CM | POA: Diagnosis not present

## 2022-02-06 DIAGNOSIS — E785 Hyperlipidemia, unspecified: Secondary | ICD-10-CM | POA: Diagnosis not present

## 2022-02-06 DIAGNOSIS — Z79899 Other long term (current) drug therapy: Secondary | ICD-10-CM | POA: Diagnosis not present

## 2022-02-06 DIAGNOSIS — Z0000 Encounter for general adult medical examination without abnormal findings: Secondary | ICD-10-CM | POA: Diagnosis not present

## 2022-02-06 DIAGNOSIS — R7309 Other abnormal glucose: Secondary | ICD-10-CM | POA: Diagnosis not present

## 2022-02-13 ENCOUNTER — Other Ambulatory Visit: Payer: Medicare Other

## 2022-02-14 ENCOUNTER — Other Ambulatory Visit: Payer: Self-pay | Admitting: Pharmacist

## 2022-02-15 ENCOUNTER — Encounter: Payer: Self-pay | Admitting: Oncology

## 2022-02-15 ENCOUNTER — Other Ambulatory Visit: Payer: Self-pay

## 2022-02-15 ENCOUNTER — Inpatient Hospital Stay: Payer: Medicare Other | Attending: Oncology

## 2022-02-15 VITALS — BP 133/59 | HR 73 | Temp 98.8°F | Resp 18 | Ht 63.0 in | Wt 153.5 lb

## 2022-02-15 DIAGNOSIS — N183 Chronic kidney disease, stage 3 unspecified: Secondary | ICD-10-CM

## 2022-02-15 DIAGNOSIS — D631 Anemia in chronic kidney disease: Secondary | ICD-10-CM | POA: Diagnosis not present

## 2022-02-15 DIAGNOSIS — N1832 Chronic kidney disease, stage 3b: Secondary | ICD-10-CM | POA: Insufficient documentation

## 2022-02-15 MED ORDER — EPOETIN ALFA-EPBX 20000 UNIT/ML IJ SOLN
20000.0000 [IU] | Freq: Once | INTRAMUSCULAR | Status: AC
  Administered 2022-02-15: 20000 [IU] via SUBCUTANEOUS
  Filled 2022-02-15: qty 1

## 2022-02-15 NOTE — Patient Instructions (Signed)
Epoetin Alfa injection °What is this medication? °EPOETIN ALFA (e POE e tin AL fa) helps your body make more red blood cells. This medicine is used to treat anemia caused by chronic kidney disease, cancer chemotherapy, or HIV-therapy. It may also be used before surgery if you have anemia. °This medicine may be used for other purposes; ask your health care provider or pharmacist if you have questions. °COMMON BRAND NAME(S): Epogen, Procrit, Retacrit °What should I tell my care team before I take this medication? °They need to know if you have any of these conditions: °cancer °heart disease °high blood pressure °history of blood clots °history of stroke °low levels of folate, iron, or vitamin B12 in the blood °seizures °an unusual or allergic reaction to erythropoietin, albumin, benzyl alcohol, hamster proteins, other medicines, foods, dyes, or preservatives °pregnant or trying to get pregnant °breast-feeding °How should I use this medication? °This medicine is for injection into a vein or under the skin. It is usually given by a health care professional in a hospital or clinic setting. °If you get this medicine at home, you will be taught how to prepare and give this medicine. Use exactly as directed. Take your medicine at regular intervals. Do not take your medicine more often than directed. °It is important that you put your used needles and syringes in a special sharps container. Do not put them in a trash can. If you do not have a sharps container, call your pharmacist or healthcare provider to get one. °A special MedGuide will be given to you by the pharmacist with each prescription and refill. Be sure to read this information carefully each time. °Talk to your pediatrician regarding the use of this medicine in children. While this drug may be prescribed for selected conditions, precautions do apply. °Overdosage: If you think you have taken too much of this medicine contact a poison control center or emergency  room at once. °NOTE: This medicine is only for you. Do not share this medicine with others. °What if I miss a dose? °If you miss a dose, take it as soon as you can. If it is almost time for your next dose, take only that dose. Do not take double or extra doses. °What may interact with this medication? °Interactions have not been studied. °This list may not describe all possible interactions. Give your health care provider a list of all the medicines, herbs, non-prescription drugs, or dietary supplements you use. Also tell them if you smoke, drink alcohol, or use illegal drugs. Some items may interact with your medicine. °What should I watch for while using this medication? °Your condition will be monitored carefully while you are receiving this medicine. °You may need blood work done while you are taking this medicine. °This medicine may cause a decrease in vitamin B6. You should make sure that you get enough vitamin B6 while you are taking this medicine. Discuss the foods you eat and the vitamins you take with your health care professional. °What side effects may I notice from receiving this medication? °Side effects that you should report to your doctor or health care professional as soon as possible: °allergic reactions like skin rash, itching or hives, swelling of the face, lips, or tongue °seizures °signs and symptoms of a blood clot such as breathing problems; changes in vision; chest pain; severe, sudden headache; pain, swelling, warmth in the leg; trouble speaking; sudden numbness or weakness of the face, arm or leg °signs and symptoms of a stroke like   changes in vision; confusion; trouble speaking or understanding; severe headaches; sudden numbness or weakness of the face, arm or leg; trouble walking; dizziness; loss of balance or coordination °Side effects that usually do not require medical attention (report to your doctor or health care professional if they continue or are  bothersome): °chills °cough °dizziness °fever °headaches °joint pain °muscle cramps °muscle pain °nausea, vomiting °pain, redness, or irritation at site where injected °This list may not describe all possible side effects. Call your doctor for medical advice about side effects. You may report side effects to FDA at 1-800-FDA-1088. °Where should I keep my medication? °Keep out of the reach of children. °Store in a refrigerator between 2 and 8 degrees C (36 and 46 degrees F). Do not freeze or shake. Throw away any unused portion if using a single-dose vial. Multi-dose vials can be kept in the refrigerator for up to 21 days after the initial dose. Throw away unused medicine. °NOTE: This sheet is a summary. It may not cover all possible information. If you have questions about this medicine, talk to your doctor, pharmacist, or health care provider. °© 2022 Elsevier/Gold Standard (2017-08-14 00:00:00) ° °

## 2022-02-20 ENCOUNTER — Other Ambulatory Visit: Payer: Medicare Other

## 2022-03-09 ENCOUNTER — Other Ambulatory Visit: Payer: Self-pay | Admitting: Cardiology

## 2022-03-12 NOTE — Progress Notes (Signed)
?[facility]  ?[address] ?[place],  [place]  [address] ?[phone] ? ?Clinic Day:  03/13/2022 ? ?Referring physician: Ronita Hipps, MD ? ? ?HISTORY OF PRESENT ILLNESS:  ?The patient is a 76 y.o. female with anemia secondary to chronic renal insufficiency.  Over these past months, she has intermittently received Retacrit therapy with the goal being to keep her hemoglobin above 10.  She comes in today to reassess her anemia.  Overall, she complains of being very fatigued.  Initially, her Retacrit dose was 10,000 units, but was increased to 20,000 units.  Over these past month, this patient's hemoglobin has not been above 9. ? ?PHYSICAL EXAM:  ?Blood pressure (!) 142/65, pulse 69, temperature 98.8 ?F (37.1 ?C), resp. rate 14, height 5' 3"  (1.6 m), weight 146 lb 14.4 oz (66.6 kg), SpO2 95 %. ?Wt Readings from Last 3 Encounters:  ?03/13/22 146 lb 14.4 oz (66.6 kg)  ?02/15/22 153 lb 8 oz (69.6 kg)  ?01/18/22 151 lb 0.6 oz (68.5 kg)  ? ?Body mass index is 26.02 kg/m?Marland Kitchen ?Performance status (ECOG): 1 - Symptomatic but completely ambulatory ?Physical Exam ?Constitutional:   ?   Appearance: Normal appearance. She is not ill-appearing.  ?HENT:  ?   Mouth/Throat:  ?   Mouth: Mucous membranes are moist.  ?   Pharynx: Oropharynx is clear. No oropharyngeal exudate or posterior oropharyngeal erythema.  ?Cardiovascular:  ?   Rate and Rhythm: Normal rate and regular rhythm.  ?   Heart sounds: No murmur heard. ?  No friction rub. No gallop.  ?Pulmonary:  ?   Effort: Pulmonary effort is normal. No respiratory distress.  ?   Breath sounds: Normal breath sounds. No wheezing, rhonchi or rales.  ?Abdominal:  ?   General: Bowel sounds are normal. There is no distension.  ?   Palpations: Abdomen is soft. There is no mass.  ?   Tenderness: There is no abdominal tenderness.  ?Musculoskeletal:     ?   General: No swelling.  ?   Right lower leg: No edema.  ?   Left lower leg: No edema.  ?Lymphadenopathy:   ?   Cervical: No cervical adenopathy.  ?   Upper Body:  ?   Right upper body: No supraclavicular or axillary adenopathy.  ?   Left upper body: No supraclavicular or axillary adenopathy.  ?   Lower Body: No right inguinal adenopathy. No left inguinal adenopathy.  ?Skin: ?   General: Skin is warm.  ?   Coloration: Skin is not jaundiced.  ?   Findings: No lesion or rash.  ?Neurological:  ?   General: No focal deficit present.  ?   Mental Status: She is alert and oriented to person, place, and time. Mental status is at baseline.  ?Psychiatric:     ?   Mood and Affect: Mood normal.     ?   Behavior: Behavior normal.     ?   Thought Content: Thought content normal.  ? ? ?LABS:  ? ?CBC Latest Ref Rng & Units 03/13/2022 01/16/2022 12/20/2021  ?WBC - 6.8 5.2 7.2  ?Hemoglobin 12.0 - 16.0 8.9(A) 8.5(A) 9.3(A)  ?Hematocrit 36 - 46 28(A) 25(A) 27(A)  ?Platelets 150 - 400 K/uL 187 178 200  ? ?CMP Latest Ref Rng & Units 12/20/2021 11/21/2021 11/11/2021  ?Glucose 70 - 99 mg/dL - - 95  ?BUN 4 - 21 37(A) 18 20  ?Creatinine 0.5 - 1.1 1.5(A) 1.1 1.06  ?Sodium  137 - 147 138 134(A) 135  ?Potassium 3.4 - 5.3 4.1 4.0 4.6  ?Chloride 99 - 108 104 100 97  ?CO2 13 - 22 27(A) 25(A) 29  ?Calcium 8.7 - 10.7 9.6 9.0 9.6  ?Total Protein 6.0 - 8.3 g/dL - - 7.4  ?Total Bilirubin 0.2 - 1.2 mg/dL - - 0.4  ?Alkaline Phos 25 - 125 82 69 67  ?AST 13 - 35 31 26 20   ?ALT 7 - 35 20 20 15   ? ? ?ASSESSMENT & PLAN:  ?Assessment/Plan:  A 76 y.o. female with anemia secondary to renal insufficiency.  As her hemoglobin remains significantly below 10, her Retacrit will be increased to 40,000 units monthly, including a dose this week and in 1 month.  I will see her back in 2 months for repeat clinical assessment.  If there is no significant response from her increased Retacrit dosage, the patient understands a bone marrow biopsy will be done to ensure there is no intrinsic marrow disorders factoring into her persistent anemia.  The patient understands all the plans  discussed today and is in agreement with them.   ? ?Amahd Morino Macarthur Critchley, MD ?  ? ? ?  ?

## 2022-03-13 ENCOUNTER — Other Ambulatory Visit: Payer: Self-pay

## 2022-03-13 ENCOUNTER — Inpatient Hospital Stay: Payer: Medicare Other

## 2022-03-13 ENCOUNTER — Inpatient Hospital Stay: Payer: Medicare Other | Attending: Oncology | Admitting: Oncology

## 2022-03-13 VITALS — BP 142/65 | HR 69 | Temp 98.8°F | Resp 14 | Ht 63.0 in | Wt 146.9 lb

## 2022-03-13 DIAGNOSIS — D631 Anemia in chronic kidney disease: Secondary | ICD-10-CM

## 2022-03-13 DIAGNOSIS — N189 Chronic kidney disease, unspecified: Secondary | ICD-10-CM

## 2022-03-13 DIAGNOSIS — N1832 Chronic kidney disease, stage 3b: Secondary | ICD-10-CM | POA: Insufficient documentation

## 2022-03-13 DIAGNOSIS — D649 Anemia, unspecified: Secondary | ICD-10-CM | POA: Diagnosis not present

## 2022-03-13 DIAGNOSIS — N183 Chronic kidney disease, stage 3 unspecified: Secondary | ICD-10-CM | POA: Diagnosis not present

## 2022-03-13 LAB — CBC: RBC: 3.13 — AB (ref 3.87–5.11)

## 2022-03-13 LAB — CBC AND DIFFERENTIAL
HCT: 28 — AB (ref 36–46)
Hemoglobin: 8.9 — AB (ref 12.0–16.0)
Neutrophils Absolute: 4.15
Platelets: 187 10*3/uL (ref 150–400)
WBC: 6.8

## 2022-03-15 ENCOUNTER — Ambulatory Visit: Payer: Medicare Other

## 2022-03-18 DIAGNOSIS — Z20822 Contact with and (suspected) exposure to covid-19: Secondary | ICD-10-CM | POA: Diagnosis not present

## 2022-03-20 ENCOUNTER — Other Ambulatory Visit: Payer: Medicare Other

## 2022-03-22 ENCOUNTER — Inpatient Hospital Stay: Payer: Medicare Other

## 2022-03-22 ENCOUNTER — Other Ambulatory Visit: Payer: Self-pay | Admitting: Pharmacist

## 2022-03-22 VITALS — BP 169/62 | HR 74 | Resp 18 | Ht 63.0 in | Wt 144.2 lb

## 2022-03-22 DIAGNOSIS — D631 Anemia in chronic kidney disease: Secondary | ICD-10-CM | POA: Diagnosis not present

## 2022-03-22 DIAGNOSIS — N1832 Chronic kidney disease, stage 3b: Secondary | ICD-10-CM | POA: Diagnosis not present

## 2022-03-22 MED ORDER — EPOETIN ALFA-EPBX 40000 UNIT/ML IJ SOLN
40000.0000 [IU] | Freq: Once | INTRAMUSCULAR | Status: AC
  Administered 2022-03-22: 40000 [IU] via SUBCUTANEOUS
  Filled 2022-03-22: qty 1

## 2022-03-22 NOTE — Patient Instructions (Signed)
Epoetin Alfa injection °What is this medication? °EPOETIN ALFA (e POE e tin AL fa) helps your body make more red blood cells. This medicine is used to treat anemia caused by chronic kidney disease, cancer chemotherapy, or HIV-therapy. It may also be used before surgery if you have anemia. °This medicine may be used for other purposes; ask your health care provider or pharmacist if you have questions. °COMMON BRAND NAME(S): Epogen, Procrit, Retacrit °What should I tell my care team before I take this medication? °They need to know if you have any of these conditions: °cancer °heart disease °high blood pressure °history of blood clots °history of stroke °low levels of folate, iron, or vitamin B12 in the blood °seizures °an unusual or allergic reaction to erythropoietin, albumin, benzyl alcohol, hamster proteins, other medicines, foods, dyes, or preservatives °pregnant or trying to get pregnant °breast-feeding °How should I use this medication? °This medicine is for injection into a vein or under the skin. It is usually given by a health care professional in a hospital or clinic setting. °If you get this medicine at home, you will be taught how to prepare and give this medicine. Use exactly as directed. Take your medicine at regular intervals. Do not take your medicine more often than directed. °It is important that you put your used needles and syringes in a special sharps container. Do not put them in a trash can. If you do not have a sharps container, call your pharmacist or healthcare provider to get one. °A special MedGuide will be given to you by the pharmacist with each prescription and refill. Be sure to read this information carefully each time. °Talk to your pediatrician regarding the use of this medicine in children. While this drug may be prescribed for selected conditions, precautions do apply. °Overdosage: If you think you have taken too much of this medicine contact a poison control center or emergency  room at once. °NOTE: This medicine is only for you. Do not share this medicine with others. °What if I miss a dose? °If you miss a dose, take it as soon as you can. If it is almost time for your next dose, take only that dose. Do not take double or extra doses. °What may interact with this medication? °Interactions have not been studied. °This list may not describe all possible interactions. Give your health care provider a list of all the medicines, herbs, non-prescription drugs, or dietary supplements you use. Also tell them if you smoke, drink alcohol, or use illegal drugs. Some items may interact with your medicine. °What should I watch for while using this medication? °Your condition will be monitored carefully while you are receiving this medicine. °You may need blood work done while you are taking this medicine. °This medicine may cause a decrease in vitamin B6. You should make sure that you get enough vitamin B6 while you are taking this medicine. Discuss the foods you eat and the vitamins you take with your health care professional. °What side effects may I notice from receiving this medication? °Side effects that you should report to your doctor or health care professional as soon as possible: °allergic reactions like skin rash, itching or hives, swelling of the face, lips, or tongue °seizures °signs and symptoms of a blood clot such as breathing problems; changes in vision; chest pain; severe, sudden headache; pain, swelling, warmth in the leg; trouble speaking; sudden numbness or weakness of the face, arm or leg °signs and symptoms of a stroke like   changes in vision; confusion; trouble speaking or understanding; severe headaches; sudden numbness or weakness of the face, arm or leg; trouble walking; dizziness; loss of balance or coordination °Side effects that usually do not require medical attention (report to your doctor or health care professional if they continue or are  bothersome): °chills °cough °dizziness °fever °headaches °joint pain °muscle cramps °muscle pain °nausea, vomiting °pain, redness, or irritation at site where injected °This list may not describe all possible side effects. Call your doctor for medical advice about side effects. You may report side effects to FDA at 1-800-FDA-1088. °Where should I keep my medication? °Keep out of the reach of children. °Store in a refrigerator between 2 and 8 degrees C (36 and 46 degrees F). Do not freeze or shake. Throw away any unused portion if using a single-dose vial. Multi-dose vials can be kept in the refrigerator for up to 21 days after the initial dose. Throw away unused medicine. °NOTE: This sheet is a summary. It may not cover all possible information. If you have questions about this medicine, talk to your doctor, pharmacist, or health care provider. °© 2022 Elsevier/Gold Standard (2017-08-14 00:00:00) ° °

## 2022-03-24 DIAGNOSIS — E785 Hyperlipidemia, unspecified: Secondary | ICD-10-CM | POA: Diagnosis not present

## 2022-03-24 DIAGNOSIS — I10 Essential (primary) hypertension: Secondary | ICD-10-CM | POA: Diagnosis not present

## 2022-04-04 ENCOUNTER — Other Ambulatory Visit: Payer: Self-pay | Admitting: Cardiology

## 2022-04-12 DIAGNOSIS — R918 Other nonspecific abnormal finding of lung field: Secondary | ICD-10-CM | POA: Diagnosis not present

## 2022-04-13 ENCOUNTER — Encounter: Payer: Self-pay | Admitting: Oncology

## 2022-04-14 ENCOUNTER — Other Ambulatory Visit: Payer: Self-pay

## 2022-04-14 ENCOUNTER — Inpatient Hospital Stay: Payer: Medicare Other | Attending: Oncology

## 2022-04-14 ENCOUNTER — Other Ambulatory Visit: Payer: Self-pay | Admitting: Hematology and Oncology

## 2022-04-14 ENCOUNTER — Ambulatory Visit: Payer: Medicare Other

## 2022-04-14 DIAGNOSIS — N183 Chronic kidney disease, stage 3 unspecified: Secondary | ICD-10-CM | POA: Diagnosis not present

## 2022-04-14 DIAGNOSIS — D631 Anemia in chronic kidney disease: Secondary | ICD-10-CM | POA: Insufficient documentation

## 2022-04-14 DIAGNOSIS — N189 Chronic kidney disease, unspecified: Secondary | ICD-10-CM | POA: Diagnosis not present

## 2022-04-14 DIAGNOSIS — D649 Anemia, unspecified: Secondary | ICD-10-CM | POA: Diagnosis not present

## 2022-04-14 LAB — IRON AND TIBC
Iron: 77 ug/dL (ref 28–170)
Saturation Ratios: 20 % (ref 10.4–31.8)
TIBC: 376 ug/dL (ref 250–450)
UIBC: 299 ug/dL

## 2022-04-14 LAB — HEPATIC FUNCTION PANEL
ALT: 15 U/L (ref 7–35)
AST: 31 (ref 13–35)
Alkaline Phosphatase: 58 (ref 25–125)
Bilirubin, Total: 0.4

## 2022-04-14 LAB — COMPREHENSIVE METABOLIC PANEL
Albumin: 4.5 (ref 3.5–5.0)
Calcium: 9.8 (ref 8.7–10.7)

## 2022-04-14 LAB — CBC AND DIFFERENTIAL
HCT: 27 — AB (ref 36–46)
Hemoglobin: 8.8 — AB (ref 12.0–16.0)
Neutrophils Absolute: 3.08
Platelets: 184 10*3/uL (ref 150–400)
WBC: 5.4

## 2022-04-14 LAB — CBC
MCV: 86 (ref 81–99)
RBC: 3.2 — AB (ref 3.87–5.11)

## 2022-04-14 LAB — BASIC METABOLIC PANEL
BUN: 19 (ref 4–21)
CO2: 30 — AB (ref 13–22)
Chloride: 93 — AB (ref 99–108)
Creatinine: 1.3 — AB (ref 0.5–1.1)
Glucose: 114
Potassium: 4.4 mEq/L (ref 3.5–5.1)
Sodium: 135 — AB (ref 137–147)

## 2022-04-14 LAB — FERRITIN: Ferritin: 40 ng/mL (ref 11–307)

## 2022-04-19 ENCOUNTER — Inpatient Hospital Stay: Payer: Medicare Other

## 2022-04-19 VITALS — BP 157/72 | HR 64 | Temp 98.5°F | Resp 18 | Ht 63.0 in | Wt 144.5 lb

## 2022-04-19 DIAGNOSIS — N183 Chronic kidney disease, stage 3 unspecified: Secondary | ICD-10-CM | POA: Diagnosis not present

## 2022-04-19 DIAGNOSIS — D631 Anemia in chronic kidney disease: Secondary | ICD-10-CM | POA: Diagnosis not present

## 2022-04-19 MED ORDER — EPOETIN ALFA-EPBX 40000 UNIT/ML IJ SOLN
40000.0000 [IU] | Freq: Once | INTRAMUSCULAR | Status: AC
  Administered 2022-04-19: 40000 [IU] via SUBCUTANEOUS
  Filled 2022-04-19: qty 1

## 2022-04-19 NOTE — Patient Instructions (Signed)
Epoetin Alfa injection ?What is this medication? ?EPOETIN ALFA (e POE e tin AL fa) helps your body make more red blood cells. This medicine is used to treat anemia caused by chronic kidney disease, cancer chemotherapy, or HIV-therapy. It may also be used before surgery if you have anemia. ?This medicine may be used for other purposes; ask your health care provider or pharmacist if you have questions. ?COMMON BRAND NAME(S): Epogen, Procrit, Retacrit ?What should I tell my care team before I take this medication? ?They need to know if you have any of these conditions: ?cancer ?heart disease ?high blood pressure ?history of blood clots ?history of stroke ?low levels of folate, iron, or vitamin B12 in the blood ?seizures ?an unusual or allergic reaction to erythropoietin, albumin, benzyl alcohol, hamster proteins, other medicines, foods, dyes, or preservatives ?pregnant or trying to get pregnant ?breast-feeding ?How should I use this medication? ?This medicine is for injection into a vein or under the skin. It is usually given by a health care professional in a hospital or clinic setting. ?If you get this medicine at home, you will be taught how to prepare and give this medicine. Use exactly as directed. Take your medicine at regular intervals. Do not take your medicine more often than directed. ?It is important that you put your used needles and syringes in a special sharps container. Do not put them in a trash can. If you do not have a sharps container, call your pharmacist or healthcare provider to get one. ?A special MedGuide will be given to you by the pharmacist with each prescription and refill. Be sure to read this information carefully each time. ?Talk to your pediatrician regarding the use of this medicine in children. While this drug may be prescribed for selected conditions, precautions do apply. ?Overdosage: If you think you have taken too much of this medicine contact a poison control center or emergency  room at once. ?NOTE: This medicine is only for you. Do not share this medicine with others. ?What if I miss a dose? ?If you miss a dose, take it as soon as you can. If it is almost time for your next dose, take only that dose. Do not take double or extra doses. ?What may interact with this medication? ?Interactions have not been studied. ?This list may not describe all possible interactions. Give your health care provider a list of all the medicines, herbs, non-prescription drugs, or dietary supplements you use. Also tell them if you smoke, drink alcohol, or use illegal drugs. Some items may interact with your medicine. ?What should I watch for while using this medication? ?Your condition will be monitored carefully while you are receiving this medicine. ?You may need blood work done while you are taking this medicine. ?This medicine may cause a decrease in vitamin B6. You should make sure that you get enough vitamin B6 while you are taking this medicine. Discuss the foods you eat and the vitamins you take with your health care professional. ?What side effects may I notice from receiving this medication? ?Side effects that you should report to your doctor or health care professional as soon as possible: ?allergic reactions like skin rash, itching or hives, swelling of the face, lips, or tongue ?seizures ?signs and symptoms of a blood clot such as breathing problems; changes in vision; chest pain; severe, sudden headache; pain, swelling, warmth in the leg; trouble speaking; sudden numbness or weakness of the face, arm or leg ?signs and symptoms of a stroke like   changes in vision; confusion; trouble speaking or understanding; severe headaches; sudden numbness or weakness of the face, arm or leg; trouble walking; dizziness; loss of balance or coordination ?Side effects that usually do not require medical attention (report to your doctor or health care professional if they continue or are  bothersome): ?chills ?cough ?dizziness ?fever ?headaches ?joint pain ?muscle cramps ?muscle pain ?nausea, vomiting ?pain, redness, or irritation at site where injected ?This list may not describe all possible side effects. Call your doctor for medical advice about side effects. You may report side effects to FDA at 1-800-FDA-1088. ?Where should I keep my medication? ?Keep out of the reach of children. ?Store in a refrigerator between 2 and 8 degrees C (36 and 46 degrees F). Do not freeze or shake. Throw away any unused portion if using a single-dose vial. Multi-dose vials can be kept in the refrigerator for up to 21 days after the initial dose. Throw away unused medicine. ?NOTE: This sheet is a summary. It may not cover all possible information. If you have questions about this medicine, talk to your doctor, pharmacist, or health care provider. ?? 2023 Elsevier/Gold Standard (2017-08-14 00:00:00) ? ?

## 2022-04-19 NOTE — Progress Notes (Signed)
1334: PT STABLE AT TIME OF DISCHARGE  

## 2022-04-23 DIAGNOSIS — I10 Essential (primary) hypertension: Secondary | ICD-10-CM | POA: Diagnosis not present

## 2022-04-23 DIAGNOSIS — E785 Hyperlipidemia, unspecified: Secondary | ICD-10-CM | POA: Diagnosis not present

## 2022-04-26 ENCOUNTER — Encounter: Payer: Self-pay | Admitting: Cardiology

## 2022-04-26 ENCOUNTER — Ambulatory Visit (INDEPENDENT_AMBULATORY_CARE_PROVIDER_SITE_OTHER): Payer: Medicare Other | Admitting: Cardiology

## 2022-04-26 VITALS — BP 140/60 | HR 66 | Ht 63.0 in | Wt 144.0 lb

## 2022-04-26 DIAGNOSIS — D509 Iron deficiency anemia, unspecified: Secondary | ICD-10-CM | POA: Diagnosis not present

## 2022-04-26 DIAGNOSIS — I25119 Atherosclerotic heart disease of native coronary artery with unspecified angina pectoris: Secondary | ICD-10-CM

## 2022-04-26 DIAGNOSIS — E785 Hyperlipidemia, unspecified: Secondary | ICD-10-CM | POA: Diagnosis not present

## 2022-04-26 DIAGNOSIS — I10 Essential (primary) hypertension: Secondary | ICD-10-CM

## 2022-04-26 NOTE — Progress Notes (Signed)
?Cardiology Office Note:   ? ?Date:  04/26/2022  ? ?ID:  Angela Mccoy, DOB [birth year], MRN [id number] ? ?PCP:  Ronita Hipps, MD  ?Cardiologist:  [name], MD   ? ?Referring MD: Ronita Hipps, MD  ? ? ?ASSESSMENT:   ? ?1. Coronary artery disease involving native coronary artery of native heart with angina pectoris ([place])   ?2. Essential hypertension   ?3. Hyperlipidemia, unspecified hyperlipidemia type   ?4. Iron deficiency anemia, unspecified iron deficiency anemia type   ? ?PLAN:   ? ?In order of problems listed above: ? ?Stable CAD continue her dual antiplatelet therapy and I told her she can stop clopidogrel 1 week before the bone marrow and resume the next day rare angina New York Heart Association class I to class II and continue medical treatment including oral nitrate and lipid-lowering statin along with beta-blocker ?I am unsure how well her blood pressure is controlled she needs to concentrate on good technique to avoid variability and will reassess with a list of blood pressures in 3 weeks ?Continue her statin lipid profile 02/06/2022 LDL 63 cholesterol 120 HDL 36 triglycerides 329 with a low intensity statin ?Being evaluated by Dr. Rosanne Sack otology is strongly encouraged her to move forward and get her bone marrow especially with intermittent fevers ? ? ?Next appointment: 6 months ? ? ?Medication Adjustments/Labs and Tests Ordered: ?Current medicines are reviewed at length with the patient today.  Concerns regarding medicines are outlined above.  ?No orders of the defined types were placed in this encounter. ? ?No orders of the defined types were placed in this encounter. ? ? ?Chief Complaint  ?Patient presents with  ? Follow-up  ? Coronary Artery Disease  ? ? ?History of Present Illness:   ? ?Angela Mccoy is a 76 y.o. female with a hx of CAD with remote PCI and drug-eluting stent to the LAD and right coronary artery in 2003 dyslipidemia hypertension chronic anemia chronic  lower extremity edema.  She underwent a myocardial perfusion study 05/26/2001 showing normal perfusion EF 67% no ischemic ST changes low risk.  She has anemia of chronic renal disease and has been receiving erythropoietin and Dr. Beckie Salts hematology oncology.  She was seen 10/10/2022 with accelerating pattern of angina and referred for coronary angiography. ?Coronary angiography 10/18/2021 with severe flow-limiting mid right coronary artery lesion with PCI and stent right coronary artery.  She was last seen 10/31/2021. ? ?Compliance with diet, lifestyle and medications: Yes ? ?From a cardiology perspective she is doing well rare angina relieved with nitroglycerin more often than once every few months nonexertional no edema shortness of breath palpitation or syncope. ?He is having variable blood pressures between 120/86 and 152/65 in order to concentrate on good technique log for 3 weeks and leave it in my office. ?She remains anemic her hemoglobin recently is 8.8 she is told she is not iron deficient her creatinine is 1.3 and she is having fevers at home.  She was advised to bone marrow and I told her I think she should go ahead and do that ?She also has nocturnal cough had a chest x-ray performed related to her is normal ?Past Medical History:  ?Diagnosis Date  ? Anemia 07/31/2017  ? Anemia in chronic kidney disease 10/03/2020  ? Anxiety and depression   ? Carotid bruit 02/26/2016  ? Carotid bruit present 02/26/2016  ? Cavitary pneumonia 03/20/2019  ? Onset of symptoms around late  Feb 2020 > all  symptoms resolved p 5 days Meropenem and rx x 2 weeks then changed to cleocin -  D/c cleocin 03/20/2019  With esr 12, wbc 7,800  -  Quant TB 03/20/2019  Neg  - 05/14/2019 cxr with minimal streaky residual, no as dz   ? Coronary artery disease involving native coronary artery 02/26/2016  ? Coronary artery disease involving native coronary artery of native heart with angina pectoris ([place]) 03/12/2016  ? Overview:   PCI and DES to LAD and RCA in 2003, cath 2010 wo restenosis  ? Essential hypertension 03/12/2016  ? GERD (gastroesophageal reflux disease)   ? High cholesterol 01/17/2017  ? History of colon polyps   ? Hx of diverticulitis of colon 09/11/2016  ? Hyperlipidemia 03/12/2016  ? Hypertension 01/27/2016  ? IBS (irritable bowel syndrome)   ? Insomnia 02/26/2016  ? Migraine with aura 02/26/2016  ? Mitral valve prolapse 02/26/2016  ? Peripheral vascular disease ([place]) 02/26/2016  ? Persistent atrial fibrillation ([place]) 11/06/2019  ? Pneumonia of right upper lobe due to infectious organism 02/26/2019  ? Pre-op evaluation 11/19/2019  ? Rheumatoid arthritis involving multiple joints ([place]) 02/26/2016  ? Rheumatoid arthritis involving multiple sites ([place]) 02/26/2016  ? ? ?Past Surgical History:  ?Procedure Laterality Date  ? ABDOMINAL HYSTERECTOMY    ? CHOLECYSTECTOMY    ? COLONOSCOPY  10/21/2014  ? Moderate predominantly sigmod diverticulosis. Small internal hemorrhoids with evidence of previous anorectal surgery (no active bleeding)  ? CORONARY STENT INTERVENTION N/A 10/18/2021  ? Procedure: CORONARY STENT INTERVENTION;  Surgeon: Leonie Man, MD;  Location: [facility];  Service: Cardiovascular;  Laterality: N/A;  ? ESOPHAGOGASTRODUODENOSCOPY  09/25/2012  ? Normal EGD  ? heart stents    ? x 4  ? LEFT HEART CATH AND CORONARY ANGIOGRAPHY N/A 10/18/2021  ? Procedure: LEFT HEART CATH AND CORONARY ANGIOGRAPHY;  Surgeon: Leonie Man, MD;  Location: [facility];  Service: Cardiovascular;  Laterality: N/A;  ? rectal fissure    ? ? ?Current Medications: ?Current Meds  ?Medication Sig  ? aspirin EC 81 MG tablet Take 1 tablet (81 mg total) by mouth daily. Swallow whole.  ? carvedilol (COREG) 6.25 MG tablet Take 1 tablet (6.25 mg total) by mouth 2 (two) times daily with a meal.  ? chlorthalidone (HYGROTON) 25 MG tablet Take 25 mg by mouth daily.  ? citalopram (CELEXA) 10 MG tablet Take 10 mg by mouth daily.  ?  clopidogrel (PLAVIX) 75 MG tablet TAKE 1 TABLET(75 MG) BY MOUTH DAILY  ? isosorbide mononitrate (IMDUR) 30 MG 24 hr tablet Take 30 mg by mouth daily.  ? Multiple Vitamins-Minerals (MULTIVITAMIN WITH MINERALS) tablet Take 1 tablet by mouth daily.  ? nitroGLYCERIN (NITROSTAT) 0.4 MG SL tablet ONE TABLET UNDER TONGUE AS NEEDED FOR CHEST PAIN EVERY 5 MINUTES  ? pantoprazole (PROTONIX) 40 MG tablet Take 40 mg by mouth 2 (two) times daily.  ? potassium chloride SA (KLOR-CON) 20 MEQ tablet Take 20 mEq by mouth 2 (two) times daily.  ? pravastatin (PRAVACHOL) 40 MG tablet Take 1 tablet (40 mg total) by mouth every evening.  ? QUEtiapine (SEROQUEL) 300 MG tablet Take 300 mg by mouth at bedtime.  ? traMADol (ULTRAM) 50 MG tablet Take 50 mg by mouth 3 (three) times daily.  ? valsartan (DIOVAN) 80 MG tablet Take 1 tablet (80 mg total) by mouth daily.  ?  ? ?Allergies:   Penicillins  ? ?Social History  ? ?Socioeconomic History  ? Marital status: Married  ?  Spouse name: Not on file  ? Number of children: 2  ? Years of education: Not on file  ? Highest education level: Not on file  ?Occupational History  ? Occupation: Proofreader  ?Tobacco Use  ? Smoking status: Former  ?  Packs/day: 1.00  ?  Years: 18.00  ?  Pack years: 18.00  ?  Types: Cigarettes  ?  Quit date: 12/26/1987  ?  Years since quitting: 34.3  ? Smokeless tobacco: Never  ?Vaping Use  ? Vaping Use: Never used  ?Substance and Sexual Activity  ? Alcohol use: No  ? Drug use: Never  ? Sexual activity: Not on file  ?Other Topics Concern  ? Not on file  ?Social History Narrative  ? Not on file  ? ?Social Determinants of Health  ? ?Financial Resource Strain: Not on file  ?Food Insecurity: Not on file  ?Transportation Needs: Not on file  ?Physical Activity: Not on file  ?Stress: Not on file  ?Social Connections: Not on file  ?  ? ?Family History: ?The patient's family history includes Alzheimer's disease in her father; Breast cancer in her sister; Heart attack in her  brother, mother, and sister; Lung cancer in her brother; Stroke in her maternal grandmother. There is no history of Asthma, Colon cancer, Rectal cancer, Stomach cancer, Esophageal cancer, Pancreatic cancer, or Liver

## 2022-04-26 NOTE — Patient Instructions (Signed)
Medication Instructions:  ?Your physician recommends that you continue on your current medications as directed. Please refer to the Current Medication list given to you today. ? ?*If you need a refill on your cardiac medications before your next appointment, please call your pharmacy* ? ? ?Lab Work: ?None ?If you have labs (blood work) drawn today and your tests are completely normal, you will receive your results only by: ?MyChart Message (if you have MyChart) OR ?A paper copy in the mail ?If you have any lab test that is abnormal or we need to change your treatment, we will call you to review the results. ? ? ?Testing/Procedures: ?None ? ? ?Follow-Up: ?At [facility], you and your health needs are our priority.  As part of our continuing mission to provide you with exceptional heart care, we have created designated Provider Care Teams.  These Care Teams include your primary Cardiologist (physician) and Advanced Practice Providers (APPs -  Physician Assistants and Nurse Practitioners) who all work together to provide you with the care you need, when you need it. ? ?We recommend signing up for the patient portal called "MyChart".  Sign up information is provided on this After Visit Summary.  MyChart is used to connect with patients for Virtual Visits (Telemedicine).  Patients are able to view lab/test results, encounter notes, upcoming appointments, etc.  Non-urgent messages can be sent to your provider as well.   ?To learn more about what you can do with MyChart, go to [email or web address].   ? ?Your next appointment:   ?6 month(s) ? ?The format for your next appointment:   ?In Person ? ?Provider:   ?[name], MD  ? ? ?Other Instructions ?Drop off list of blood pressures in 3 weeks ? ?Hold Clopidogrel 1 week before bone marrow and then restart the next day. ? ?Important Information About Sugar ? ? ? ? ? ? ? ? ?Healthbeat  ?Tips to measure your blood pressure correctly ? ?To determine whether you have  hypertension, a medical professional will take a blood pressure reading. How you prepare for the test, the position of your arm, and other factors can change a blood pressure reading by 10% or more. That could be enough to hide high blood pressure, start you on a drug you don't really need, or lead your doctor to incorrectly adjust your medications. ?National and international guidelines offer specific instructions for measuring blood pressure. If a doctor, nurse, or medical assistant isn't doing it right, don't hesitate to ask him or her to get with the guidelines. ?Here's what you can do to ensure a correct reading: ? Don't drink a caffeinated beverage or smoke during the 30 minutes before the test. ? Sit quietly for five minutes before the test begins. ? During the measurement, sit in a chair with your feet on the floor and your arm supported so your elbow is at about heart level. ? The inflatable part of the cuff should completely cover at least 80% of your upper arm, and the cuff should be placed on bare skin, not over a shirt. ? Don't talk during the measurement. ? Have your blood pressure measured twice, with a brief break in between. If the readings are different by 5 points or more, have it done a third time. ?There are times to break these rules. If you sometimes feel lightheaded when getting out of bed in the morning or when you stand after sitting, you should have your blood pressure checked while seated and then while  standing to see if it falls from one position to the next. ?Because blood pressure varies throughout the day, your doctor will rarely diagnose hypertension on the basis of a single reading. Instead, he or she will want to confirm the measurements on at least two occasions, usually within a few weeks of one another. The exception to this rule is if you have a blood pressure reading of 180/110 mm Hg or higher. A result this high usually calls for prompt treatment. ?It's also a good idea to  have your blood pressure measured in both arms at least once, since the reading in one arm (usually the right) may be higher than that in the left. A 2014 study in The American Journal of Medicine of nearly 3,400 people found average arm- to-arm differences in systolic blood pressure of about 5 points. The higher number should be used to make treatment decisions. ?In 2017, new guidelines from the [place], the [employer] of Cardiology, and nine other health organizations lowered the diagnosis of high blood pressure to 130/80 mm Hg or higher for all adults. The guidelines also redefined the various blood pressure categories to now include normal, elevated, Stage 1 hypertension, Stage 2 hypertension, and hypertensive crisis (see "Blood pressure categories"). ?Blood pressure categories  ?Blood pressure category SYSTOLIC ?(upper number)  DIASTOLIC ?(lower number)  ?Normal Less than 120 mm Hg and Less than 80 mm Hg  ?Elevated 120-129 mm Hg and Less than 80 mm Hg  ?High blood pressure: Stage 1 hypertension 130-139 mm Hg or 80-89 mm Hg  ?High blood pressure: Stage 2 hypertension 140 mm Hg or higher or 90 mm Hg or higher  ?Hypertensive crisis (consult your doctor immediately) Higher than 180 mm Hg and/or Higher than 120 mm Hg  ?Source: American Heart Association and American Stroke Association. ?For more on getting your blood pressure under control, buy Controlling Your Blood Pressure, a Special Health Report from [facility].  ?

## 2022-05-09 ENCOUNTER — Telehealth: Payer: Self-pay

## 2022-05-09 NOTE — Telephone Encounter (Addendum)
I notified pt to keep scheduled appt for 05/15/2022. ? ? ?RE: Fevers x 1 month (101-102.2), night sweats 1-2 months ?Received: Today ?Melodye Ped, NP  Dairl Ponder, RN ?Yes, she'll need to keep that appointment for sure. I don't know if we should try to get a [facility] scheduled for that day or not. I will talk to Amanda.  ? ? ? ? ?Pt called to report the following symptoms: intermittent fevers x 1 month 101-102.2 (102.2 last night). Night sweats  1-2 months, gown is soaked. Denies weight loss. She has felt some little swollen areas around her neck. No N/V. She saw Dr Bettina Gavia, cardiologist, on 04/26/2022, for reg checkup and he told her she need to see Dr Bobby Rumpf for possible bone marrow biopsy. She has a scheduled appt with Dr Bobby Rumpf on Monday, 05/15/2022. She just wanted to make sure she didn't need to do anything before then. I did read in [place]' last office note that he may have to do bone marrow biopsy. I sent above message to [facility]. ?

## 2022-05-14 NOTE — Progress Notes (Signed)
[facility]  [address] [place],  [place]  [address] [phone]  Clinic Day:  05/15/2022  Referring physician: Ronita Hipps, MD   HISTORY OF PRESENT ILLNESS:  The patient is a 76 y.o. female with anemia secondary to chronic renal insufficiency.  Over these past months, she has received Retacrit shots with the goal being to get her hemoglobin above 10.  She comes in today to reassess her anemia.  Since her last visit, the patient has been doing fairly well.  She has occasional fatigue; however she denies having any overt forms of blood loss  PHYSICAL EXAM:  Blood pressure (!) 120/58, pulse 71, temperature 98 F (36.7 C), resp. rate 16, height 5' 3"  (1.6 m), weight 143 lb (64.9 kg), SpO2 95 %. Wt Readings from Last 3 Encounters:  05/15/22 143 lb (64.9 kg)  04/26/22 144 lb (65.3 kg)  04/19/22 144 lb 8 oz (65.5 kg)   Body mass index is 25.33 kg/m. Performance status (ECOG): 1 - Symptomatic but completely ambulatory Physical Exam Constitutional:      Appearance: Normal appearance. She is not ill-appearing.  HENT:     Mouth/Throat:     Mouth: Mucous membranes are moist.     Pharynx: Oropharynx is clear. No oropharyngeal exudate or posterior oropharyngeal erythema.  Cardiovascular:     Rate and Rhythm: Normal rate and regular rhythm.     Heart sounds: No murmur heard.   No friction rub. No gallop.  Pulmonary:     Effort: Pulmonary effort is normal. No respiratory distress.     Breath sounds: Normal breath sounds. No wheezing, rhonchi or rales.  Abdominal:     General: Bowel sounds are normal. There is no distension.     Palpations: Abdomen is soft. There is no mass.     Tenderness: There is no abdominal tenderness.  Musculoskeletal:        General: No swelling.     Right lower leg: No edema.     Left lower leg: No edema.  Lymphadenopathy:     Cervical: No cervical adenopathy.     Upper Body:     Right upper body: No supraclavicular  or axillary adenopathy.     Left upper body: No supraclavicular or axillary adenopathy.     Lower Body: No right inguinal adenopathy. No left inguinal adenopathy.  Skin:    General: Skin is warm.     Coloration: Skin is not jaundiced.     Findings: No lesion or rash.  Neurological:     General: No focal deficit present.     Mental Status: She is alert and oriented to person, place, and time. Mental status is at baseline.  Psychiatric:        Mood and Affect: Mood normal.        Behavior: Behavior normal.        Thought Content: Thought content normal.    LABS:      Latest Ref Rng & Units 05/15/2022   12:00 AM 04/14/2022   12:00 AM 03/13/2022   12:00 AM  CBC  WBC  6.5      5.4      6.8       Hemoglobin 12.0 - 16.0 9.0      8.8      8.9       Hematocrit 36 - 46 28      27      28  Platelets 150 - 400 K/uL 185      184      187          This result is from an external source.      Latest Ref Rng & Units 04/14/2022   12:00 AM 12/20/2021   12:00 AM 11/21/2021   12:00 AM  CMP  BUN 4 - 21 19      37      18    Creatinine 0.5 - 1.1 1.3      1.5      1.1    Sodium 137 - 147 135      138      134    Potassium 3.5 - 5.1 mEq/L 4.4      4.1      4.0    Chloride 99 - 108 93      104      100    CO2 13 - 22 30      27      25     Calcium 8.7 - 10.7 9.8      9.6      9.0    Alkaline Phos 25 - 125 58      82      69    AST 13 - 35 31      31      26     ALT 7 - 35 U/L 15      20      20        This result is from an external source.    ASSESSMENT & PLAN:  Assessment/Plan:  A 76 y.o. female with anemia secondary to renal insufficiency.  Despite receiving Retacrit injections monthly over these past handful of months, her hemoglobin remains significantly below 10.  Based upon this, I will have this patient undergo a bone marrow biopsy in the forthcoming weeks to ensure an intrinsic marrow disorder is not behind her refractory anemia.  Additional Retacrit injections will be held until  after the results of her bone marrow biopsy are known.  I will see her back in approximately 3-4 weeks to go over her bone marrow biopsy results and their implications.  The patient understands all the plans discussed today and is in agreement with them.    Ranard Harte Macarthur Critchley, MD

## 2022-05-15 ENCOUNTER — Inpatient Hospital Stay: Payer: Medicare Other | Attending: Oncology

## 2022-05-15 ENCOUNTER — Inpatient Hospital Stay (INDEPENDENT_AMBULATORY_CARE_PROVIDER_SITE_OTHER): Payer: Medicare Other | Admitting: Oncology

## 2022-05-15 ENCOUNTER — Other Ambulatory Visit: Payer: Self-pay | Admitting: Oncology

## 2022-05-15 VITALS — BP 120/58 | HR 71 | Temp 98.0°F | Resp 16 | Ht 63.0 in | Wt 143.0 lb

## 2022-05-15 DIAGNOSIS — N189 Chronic kidney disease, unspecified: Secondary | ICD-10-CM

## 2022-05-15 DIAGNOSIS — D631 Anemia in chronic kidney disease: Secondary | ICD-10-CM

## 2022-05-15 LAB — CBC AND DIFFERENTIAL
HCT: 28 — AB (ref 36–46)
Hemoglobin: 9 — AB (ref 12.0–16.0)
Neutrophils Absolute: 3.64
Platelets: 185 10*3/uL (ref 150–400)
WBC: 6.5

## 2022-05-15 LAB — CBC: RBC: 3.2 — AB (ref 3.87–5.11)

## 2022-05-15 NOTE — Progress Notes (Unsigned)
PT IS HAVING FEVERS EVERY WEEK OR SO.  WILL LAST ABOUT 24 HOURS.  SHE IS ALSO HAVING NIGHT SWEATS.

## 2022-05-16 ENCOUNTER — Telehealth: Payer: Self-pay | Admitting: Oncology

## 2022-05-16 NOTE — Telephone Encounter (Signed)
05/16/22 Left msg to resched Bone Marrow to 05/26/22 arrive at 830am.

## 2022-05-17 ENCOUNTER — Encounter: Payer: Self-pay | Admitting: Oncology

## 2022-05-17 ENCOUNTER — Ambulatory Visit: Payer: Medicare Other

## 2022-05-18 ENCOUNTER — Encounter: Payer: Medicare Other | Admitting: Oncology

## 2022-05-24 DIAGNOSIS — I10 Essential (primary) hypertension: Secondary | ICD-10-CM | POA: Diagnosis not present

## 2022-05-24 DIAGNOSIS — E785 Hyperlipidemia, unspecified: Secondary | ICD-10-CM | POA: Diagnosis not present

## 2022-05-26 ENCOUNTER — Inpatient Hospital Stay: Payer: Medicare Other | Attending: Oncology | Admitting: Oncology

## 2022-05-26 ENCOUNTER — Other Ambulatory Visit: Payer: Self-pay | Admitting: Oncology

## 2022-05-26 ENCOUNTER — Inpatient Hospital Stay: Payer: Medicare Other

## 2022-05-26 VITALS — BP 118/56 | HR 70 | Temp 98.3°F | Resp 14 | Ht 63.0 in | Wt 144.3 lb

## 2022-05-26 DIAGNOSIS — D631 Anemia in chronic kidney disease: Secondary | ICD-10-CM | POA: Diagnosis not present

## 2022-05-26 DIAGNOSIS — D464 Refractory anemia, unspecified: Secondary | ICD-10-CM | POA: Diagnosis not present

## 2022-05-26 DIAGNOSIS — D649 Anemia, unspecified: Secondary | ICD-10-CM | POA: Diagnosis not present

## 2022-05-26 DIAGNOSIS — N189 Chronic kidney disease, unspecified: Secondary | ICD-10-CM | POA: Diagnosis not present

## 2022-05-26 DIAGNOSIS — N183 Chronic kidney disease, stage 3 unspecified: Secondary | ICD-10-CM

## 2022-05-26 DIAGNOSIS — D509 Iron deficiency anemia, unspecified: Secondary | ICD-10-CM | POA: Diagnosis not present

## 2022-05-26 LAB — BASIC METABOLIC PANEL
BUN: 30 — AB (ref 4–21)
CO2: 28 — AB (ref 13–22)
Chloride: 95 — AB (ref 99–108)
Creatinine: 1.8 — AB (ref 0.5–1.1)
Glucose: 119
Potassium: 4.3 mEq/L (ref 3.5–5.1)
Sodium: 136 — AB (ref 137–147)

## 2022-05-26 LAB — HEPATIC FUNCTION PANEL
ALT: 19 U/L (ref 7–35)
AST: 27 (ref 13–35)
Alkaline Phosphatase: 72 (ref 25–125)
Bilirubin, Total: 0.4

## 2022-05-26 LAB — COMPREHENSIVE METABOLIC PANEL
Albumin: 4.4 (ref 3.5–5.0)
Calcium: 9.3 (ref 8.7–10.7)

## 2022-05-26 LAB — CBC AND DIFFERENTIAL
HCT: 27 — AB (ref 36–46)
Hemoglobin: 8.9 — AB (ref 12.0–16.0)
Neutrophils Absolute: 4.96
Platelets: 179 10*3/uL (ref 150–400)
WBC: 8

## 2022-05-26 LAB — CBC: RBC: 3.11 — AB (ref 3.87–5.11)

## 2022-05-26 NOTE — Progress Notes (Signed)
BONE MARROW PROCEDURE NOTE Due to the patient's refractory anemia, a bone marrow biopsy was done today for further evaluation.  After consenting for the procedure, the patient's left posterior superior iliac crest was topically sterilized.  Afterwards, a bone marrow core and aspirate were collected, which will be sent for flow cytometry, cytogenetics, and a myelodysplasia FISH panel (if applicable).  There were no complications experienced with this procedure.

## 2022-05-30 ENCOUNTER — Encounter: Payer: Self-pay | Admitting: Oncology

## 2022-06-02 ENCOUNTER — Telehealth: Payer: Self-pay

## 2022-06-02 NOTE — Telephone Encounter (Signed)
I spoke with pt. She states she has felt bad this week and has run a low grade fever all week. One night she reports her temp was 103, and she was having chills. I reminded pt of the importance of calling us if she develops temp of 100.3 or higher, day or night. She verbalized understanding. I encouraged her to go to ER right now for evaluation, as she still reports feeling weak. She mentioned she had her teeth cleaned on Monday, but there isn't anything in her mouth that is infected. She states, "my daughter is a Therapist, sports, and I had her to look". When I questioned pt if she was having any SOB. She replied "a little short of breath".  Pt states she doesn't have a fever right now, so she wants to wait before going to ER. She asked if she had to come to [facility]? I told her No. She states she is closer to [facility]. I asked her to promise me that if her temp is 100.4 or higher, she will go to Er. She states she will.  Dr Bobby Rumpf notified of above.

## 2022-06-03 ENCOUNTER — Other Ambulatory Visit: Payer: Self-pay | Admitting: Cardiology

## 2022-06-03 DIAGNOSIS — I10 Essential (primary) hypertension: Secondary | ICD-10-CM

## 2022-06-05 ENCOUNTER — Encounter (HOSPITAL_COMMUNITY): Payer: Self-pay | Admitting: Oncology

## 2022-06-05 NOTE — Telephone Encounter (Signed)
Valsartan 80 mg # 90 x 3 refills sent to  [facility], [place] - [address] AT [address]

## 2022-06-06 ENCOUNTER — Encounter (HOSPITAL_COMMUNITY): Payer: Self-pay | Admitting: Oncology

## 2022-06-07 ENCOUNTER — Inpatient Hospital Stay: Payer: Medicare Other

## 2022-06-07 ENCOUNTER — Inpatient Hospital Stay (INDEPENDENT_AMBULATORY_CARE_PROVIDER_SITE_OTHER): Payer: Medicare Other | Admitting: Oncology

## 2022-06-07 ENCOUNTER — Other Ambulatory Visit: Payer: Self-pay

## 2022-06-07 ENCOUNTER — Other Ambulatory Visit: Payer: Self-pay | Admitting: Oncology

## 2022-06-07 VITALS — BP 124/59 | HR 79 | Temp 98.5°F | Resp 14 | Ht 63.0 in | Wt 139.9 lb

## 2022-06-07 DIAGNOSIS — D631 Anemia in chronic kidney disease: Secondary | ICD-10-CM

## 2022-06-07 DIAGNOSIS — N189 Chronic kidney disease, unspecified: Secondary | ICD-10-CM | POA: Diagnosis not present

## 2022-06-07 DIAGNOSIS — R112 Nausea with vomiting, unspecified: Secondary | ICD-10-CM

## 2022-06-07 LAB — CBC: RBC: 3.08 — AB (ref 3.87–5.11)

## 2022-06-07 LAB — CBC AND DIFFERENTIAL
HCT: 27 — AB (ref 36–46)
Hemoglobin: 8.8 — AB (ref 12.0–16.0)
Neutrophils Absolute: 6.02
Platelets: 281 10*3/uL (ref 150–400)
WBC: 9.4

## 2022-06-07 NOTE — Progress Notes (Signed)
[facility]  [address] [place],  [place]  [address] [phone]  Clinic Day:  06/07/2022  Referring physician: Ronita Hipps, MD   HISTORY OF PRESENT ILLNESS:  The patient is a 76 y.o. female with anemia secondary to chronic renal insufficiency.  However, due to her anemia being refractory to Retacrit shots.  A bone marrow biopsy was done to determine if any intrinsic marrow disorders are present.  She comes in today to go over her bone marrow biopsy results and their implications.  Since her last visit, the patient has been doing okay.  She has had sporadic fevers over these past few weeks, but can not pinpoint the etiology behind them.  She also complains of occasional body numbness.  Her concern is she may be having "mini-strokes." However, she claims her blood pressure is under better control.  As it pertains to her anemia, she denies having any overt forms of blood loss.  PHYSICAL EXAM:  Blood pressure (!) 124/59, pulse 79, temperature 98.5 F (36.9 C), resp. rate 14, height _0  (1.6 m), weight 139 lb 14.4 oz (63.5 kg), SpO2 96 %. Wt Readings from Last 3 Encounters:  06/07/22 139 lb 14.4 oz (63.5 kg)  05/26/22 144 lb 4.8 oz (65.5 kg)  05/15/22 143 lb (64.9 kg)   Body mass index is 24.78 kg/m. Performance status (ECOG): 1 - Symptomatic but completely ambulatory Physical Exam Constitutional:      Appearance: Normal appearance. She is not ill-appearing.  HENT:     Mouth/Throat:     Mouth: Mucous membranes are moist.     Pharynx: Oropharynx is clear. No oropharyngeal exudate or posterior oropharyngeal erythema.  Cardiovascular:     Rate and Rhythm: Normal rate and regular rhythm.     Heart sounds: No murmur heard.    No friction rub. No gallop.  Pulmonary:     Effort: Pulmonary effort is normal. No respiratory distress.     Breath sounds: Normal breath sounds. No wheezing, rhonchi or rales.  Abdominal:     General: Bowel sounds are  normal. There is no distension.     Palpations: Abdomen is soft. There is no mass.     Tenderness: There is no abdominal tenderness.  Musculoskeletal:        General: No swelling.     Right lower leg: No edema.     Left lower leg: No edema.  Lymphadenopathy:     Cervical: No cervical adenopathy.     Upper Body:     Right upper body: No supraclavicular or axillary adenopathy.     Left upper body: No supraclavicular or axillary adenopathy.     Lower Body: No right inguinal adenopathy. No left inguinal adenopathy.  Skin:    General: Skin is warm.     Coloration: Skin is not jaundiced.     Findings: No lesion or rash.  Neurological:     General: No focal deficit present.     Mental Status: She is alert and oriented to person, place, and time. Mental status is at baseline.  Psychiatric:        Mood and Affect: Mood normal.        Behavior: Behavior normal.        Thought Content: Thought content normal.   PATHOLOGY:  Her bone marrow biopsy revealed the following: DIAGNOSIS:   BONE MARROW, ASPIRATE, CLOT, CORE:  -Hypercellular bone marrow for age with trilineage hematopoiesis  -See comment   PERIPHERAL  BLOOD:  -Normocytic-hypochromic anemia   COMMENT:   The bone marrow is hypercellular for age with trilineage hematopoiesis  but with relative abundance of granulocytic precursors.  There are  dyspoietic changes primarily involving the granulocytic and  megakaryocytic cell lines with no increase in blastic cells.  In the  setting of chronic kidney disease, the overall changes are not  considered specific or diagnostic of a myeloid neoplasm and possibly  represent secondary changes.  Nonetheless, correlation with cytogenetic  and FISH studies is recommended.   MICROSCOPIC DESCRIPTION:   PERIPHERAL BLOOD SMEAR: The red blood cells display mild  anisopoikilocytosis with minimal polychromasia.  The white blood cells  are normal in number with scattered hypogranular neutrophils.   The  platelets are normal in number.   BONE MARROW ASPIRATE: Bone marrow particles present  Erythroid precursors: Orderly and progressive maturation for the most  part.  Only occasional late precursors display nuclear cytoplasmic  dyssynchrony or irregular nuclei  Granulocytic precursors: Progressive maturation with many mature  neutrophilic cells displaying hypogranulation or occasional  hypolobation.  No increase in blastic cells identified  Megakaryocytes: Abundant with many large and/or hyperchromatic forms  Lymphocytes/plasma cells: Large aggregates not present   TOUCH PREPARATIONS: A mixture of cell types present   CLOT AND BIOPSY: The core biopsy is suboptimal due to aspiration and  crush artifacts.  In small relatively intact areas along with the clot  sections, the cellularity ranges from less than 10% to 70% and shows a  mixture of myeloid cell types including scattered large hyperchromatic  megakaryocytes.  Significant lymphoid aggregates are not seen.   IRON STAIN: Iron stains are performed on a bone marrow aspirate or touch  imprint smear and section of clot. The controls stained appropriately.        Storage Iron: Present       Ring Sideroblasts: Absent  ------------------------------------------------ Her MDS FISH panel revealed the following:   --------------------------------------------------------------------------------------- Her cytogenetics revealed the following:    LABS:    ASSESSMENT & PLAN:  Assessment/Plan:  A 76 y.o. female with anemia secondary to renal insufficiency.  In clinic today, I went over her bone marrow results with her, for which no abnormal marrow pathology was appreciated.  Over these past months, the patient has been receiving Retacrit, but with no significant improvement.  Although her bone marrow did show that iron stores appeared adequate, I will give her a course of IV iron.  A few weeks later, I will restart her monthly Retacrit  shots, with the hope (not guarantee) that the sequential combination will lead to a rise in her hemoglobin.  I will see her back in 3 months for repeat clinical assessment.  The patient understands all the plans discussed today and is in agreement with them.    Erron Wengert Macarthur Critchley, MD

## 2022-06-08 MED FILL — Iron Sucrose Inj 20 MG/ML (Fe Equiv): INTRAVENOUS | Qty: 10 | Status: AC

## 2022-06-09 ENCOUNTER — Inpatient Hospital Stay: Payer: Medicare Other

## 2022-06-09 VITALS — BP 107/56 | HR 78 | Temp 98.2°F | Resp 18 | Wt 141.0 lb

## 2022-06-09 DIAGNOSIS — D631 Anemia in chronic kidney disease: Secondary | ICD-10-CM

## 2022-06-09 DIAGNOSIS — N189 Chronic kidney disease, unspecified: Secondary | ICD-10-CM | POA: Diagnosis not present

## 2022-06-09 DIAGNOSIS — D464 Refractory anemia, unspecified: Secondary | ICD-10-CM | POA: Diagnosis not present

## 2022-06-09 MED ORDER — SODIUM CHLORIDE 0.9 % IV SOLN: Freq: Once | INTRAVENOUS | Status: AC

## 2022-06-09 MED ORDER — SODIUM CHLORIDE 0.9 % IV SOLN
200.0000 mg | Freq: Once | INTRAVENOUS | Status: AC
  Administered 2022-06-09: 200 mg via INTRAVENOUS
  Filled 2022-06-09: qty 200

## 2022-06-09 NOTE — Patient Instructions (Signed)

## 2022-06-09 NOTE — Progress Notes (Signed)
Patient waited 30 minute post iron observation period. No issues. Treatment tolerated well. VSS upon discharge.

## 2022-06-12 ENCOUNTER — Encounter: Payer: Self-pay | Admitting: Oncology

## 2022-06-12 ENCOUNTER — Inpatient Hospital Stay: Payer: Medicare Other

## 2022-06-12 VITALS — BP 118/60 | HR 70 | Temp 98.1°F | Resp 18 | Ht 63.0 in | Wt 143.0 lb

## 2022-06-12 DIAGNOSIS — N189 Chronic kidney disease, unspecified: Secondary | ICD-10-CM | POA: Diagnosis not present

## 2022-06-12 DIAGNOSIS — D631 Anemia in chronic kidney disease: Secondary | ICD-10-CM

## 2022-06-12 DIAGNOSIS — D464 Refractory anemia, unspecified: Secondary | ICD-10-CM | POA: Diagnosis not present

## 2022-06-12 MED ORDER — SODIUM CHLORIDE 0.9 % IV SOLN: Freq: Once | INTRAVENOUS | Status: AC

## 2022-06-12 MED ORDER — SODIUM CHLORIDE 0.9 % IV SOLN
200.0000 mg | Freq: Once | INTRAVENOUS | Status: AC
  Administered 2022-06-12: 200 mg via INTRAVENOUS
  Filled 2022-06-12: qty 200

## 2022-06-12 NOTE — Patient Instructions (Signed)

## 2022-06-13 MED FILL — Iron Sucrose Inj 20 MG/ML (Fe Equiv): INTRAVENOUS | Qty: 10 | Status: AC

## 2022-06-14 ENCOUNTER — Inpatient Hospital Stay: Payer: Medicare Other

## 2022-06-14 VITALS — BP 125/58 | HR 74 | Temp 98.5°F | Resp 18 | Ht 63.0 in | Wt 140.1 lb

## 2022-06-14 DIAGNOSIS — D631 Anemia in chronic kidney disease: Secondary | ICD-10-CM

## 2022-06-14 DIAGNOSIS — D464 Refractory anemia, unspecified: Secondary | ICD-10-CM | POA: Diagnosis not present

## 2022-06-14 DIAGNOSIS — N189 Chronic kidney disease, unspecified: Secondary | ICD-10-CM | POA: Diagnosis not present

## 2022-06-14 MED ORDER — ONDANSETRON HCL 4 MG PO TABS: 4.0000 mg | ORAL_TABLET | Freq: Three times a day (TID) | ORAL | 0 refills | Status: DC | PRN

## 2022-06-14 MED ORDER — SODIUM CHLORIDE 0.9 % IV SOLN
200.0000 mg | Freq: Once | INTRAVENOUS | Status: AC
  Administered 2022-06-14: 200 mg via INTRAVENOUS
  Filled 2022-06-14: qty 200

## 2022-06-14 MED ORDER — SODIUM CHLORIDE 0.9 % IV SOLN: Freq: Once | INTRAVENOUS | Status: AC

## 2022-06-14 NOTE — Progress Notes (Signed)
1400 patient stated she had nausea, diarrhea and vomiting the morning after her treatment. Spoke with Marlon Pel and she ordered her to take zofran at home before and after her next treatments.

## 2022-06-14 NOTE — Patient Instructions (Signed)

## 2022-06-16 ENCOUNTER — Inpatient Hospital Stay: Payer: Medicare Other

## 2022-06-16 DIAGNOSIS — N189 Chronic kidney disease, unspecified: Secondary | ICD-10-CM | POA: Diagnosis not present

## 2022-06-16 DIAGNOSIS — D631 Anemia in chronic kidney disease: Secondary | ICD-10-CM

## 2022-06-16 DIAGNOSIS — D464 Refractory anemia, unspecified: Secondary | ICD-10-CM | POA: Diagnosis not present

## 2022-06-16 MED ORDER — SODIUM CHLORIDE 0.9 % IV SOLN: Freq: Once | INTRAVENOUS | Status: AC

## 2022-06-16 MED ORDER — SODIUM CHLORIDE 0.9 % IV SOLN
200.0000 mg | Freq: Once | INTRAVENOUS | Status: AC
  Administered 2022-06-16: 200 mg via INTRAVENOUS
  Filled 2022-06-16: qty 200

## 2022-06-19 ENCOUNTER — Encounter: Payer: Self-pay | Admitting: Oncology

## 2022-06-19 ENCOUNTER — Inpatient Hospital Stay: Payer: Medicare Other

## 2022-06-19 VITALS — BP 145/61 | HR 63 | Resp 18 | Wt 135.1 lb

## 2022-06-19 DIAGNOSIS — D631 Anemia in chronic kidney disease: Secondary | ICD-10-CM | POA: Diagnosis not present

## 2022-06-19 DIAGNOSIS — N189 Chronic kidney disease, unspecified: Secondary | ICD-10-CM | POA: Diagnosis not present

## 2022-06-19 DIAGNOSIS — N183 Chronic kidney disease, stage 3 unspecified: Secondary | ICD-10-CM

## 2022-06-19 DIAGNOSIS — D464 Refractory anemia, unspecified: Secondary | ICD-10-CM | POA: Diagnosis not present

## 2022-06-19 MED ORDER — SODIUM CHLORIDE 0.9 % IV SOLN
200.0000 mg | Freq: Once | INTRAVENOUS | Status: AC
  Administered 2022-06-19: 200 mg via INTRAVENOUS
  Filled 2022-06-19: qty 200

## 2022-06-19 MED ORDER — SODIUM CHLORIDE 0.9 % IV SOLN: Freq: Once | INTRAVENOUS | Status: AC

## 2022-06-26 LAB — SURGICAL PATHOLOGY

## 2022-07-06 ENCOUNTER — Encounter: Payer: Self-pay | Admitting: Oncology

## 2022-07-10 ENCOUNTER — Inpatient Hospital Stay: Payer: Medicare Other

## 2022-07-10 ENCOUNTER — Inpatient Hospital Stay: Payer: Medicare Other | Attending: Oncology

## 2022-07-10 VITALS — BP 140/61 | HR 78 | Temp 98.9°F | Resp 18 | Ht 63.0 in | Wt 143.0 lb

## 2022-07-10 DIAGNOSIS — N189 Chronic kidney disease, unspecified: Secondary | ICD-10-CM | POA: Insufficient documentation

## 2022-07-10 DIAGNOSIS — D631 Anemia in chronic kidney disease: Secondary | ICD-10-CM

## 2022-07-10 DIAGNOSIS — D464 Refractory anemia, unspecified: Secondary | ICD-10-CM | POA: Insufficient documentation

## 2022-07-10 DIAGNOSIS — N183 Chronic kidney disease, stage 3 unspecified: Secondary | ICD-10-CM | POA: Diagnosis present

## 2022-07-10 DIAGNOSIS — D649 Anemia, unspecified: Secondary | ICD-10-CM | POA: Diagnosis not present

## 2022-07-10 LAB — CBC AND DIFFERENTIAL
HCT: 25 — AB (ref 36–46)
Hemoglobin: 8.7 — AB (ref 12.0–16.0)
Neutrophils Absolute: 4.39
Platelets: 176 10*3/uL (ref 150–400)
WBC: 7.2

## 2022-07-10 LAB — CBC: RBC: 2.79 — AB (ref 3.87–5.11)

## 2022-07-10 MED ORDER — EPOETIN ALFA-EPBX 40000 UNIT/ML IJ SOLN
40000.0000 [IU] | Freq: Once | INTRAMUSCULAR | Status: AC
  Administered 2022-07-10: 40000 [IU] via SUBCUTANEOUS
  Filled 2022-07-10: qty 1

## 2022-07-10 NOTE — Patient Instructions (Signed)
Epoetin Alfa injection ?What is this medication? ?EPOETIN ALFA (e POE e tin AL fa) helps your body make more red blood cells. This medicine is used to treat anemia caused by chronic kidney disease, cancer chemotherapy, or HIV-therapy. It may also be used before surgery if you have anemia. ?This medicine may be used for other purposes; ask your health care provider or pharmacist if you have questions. ?COMMON BRAND NAME(S): Epogen, Procrit, Retacrit ?What should I tell my care team before I take this medication? ?They need to know if you have any of these conditions: ?cancer ?heart disease ?high blood pressure ?history of blood clots ?history of stroke ?low levels of folate, iron, or vitamin B12 in the blood ?seizures ?an unusual or allergic reaction to erythropoietin, albumin, benzyl alcohol, hamster proteins, other medicines, foods, dyes, or preservatives ?pregnant or trying to get pregnant ?breast-feeding ?How should I use this medication? ?This medicine is for injection into a vein or under the skin. It is usually given by a health care professional in a hospital or clinic setting. ?If you get this medicine at home, you will be taught how to prepare and give this medicine. Use exactly as directed. Take your medicine at regular intervals. Do not take your medicine more often than directed. ?It is important that you put your used needles and syringes in a special sharps container. Do not put them in a trash can. If you do not have a sharps container, call your pharmacist or healthcare provider to get one. ?A special MedGuide will be given to you by the pharmacist with each prescription and refill. Be sure to read this information carefully each time. ?Talk to your pediatrician regarding the use of this medicine in children. While this drug may be prescribed for selected conditions, precautions do apply. ?Overdosage: If you think you have taken too much of this medicine contact a poison control center or emergency  room at once. ?NOTE: This medicine is only for you. Do not share this medicine with others. ?What if I miss a dose? ?If you miss a dose, take it as soon as you can. If it is almost time for your next dose, take only that dose. Do not take double or extra doses. ?What may interact with this medication? ?Interactions have not been studied. ?This list may not describe all possible interactions. Give your health care provider a list of all the medicines, herbs, non-prescription drugs, or dietary supplements you use. Also tell them if you smoke, drink alcohol, or use illegal drugs. Some items may interact with your medicine. ?What should I watch for while using this medication? ?Your condition will be monitored carefully while you are receiving this medicine. ?You may need blood work done while you are taking this medicine. ?This medicine may cause a decrease in vitamin B6. You should make sure that you get enough vitamin B6 while you are taking this medicine. Discuss the foods you eat and the vitamins you take with your health care professional. ?What side effects may I notice from receiving this medication? ?Side effects that you should report to your doctor or health care professional as soon as possible: ?allergic reactions like skin rash, itching or hives, swelling of the face, lips, or tongue ?seizures ?signs and symptoms of a blood clot such as breathing problems; changes in vision; chest pain; severe, sudden headache; pain, swelling, warmth in the leg; trouble speaking; sudden numbness or weakness of the face, arm or leg ?signs and symptoms of a stroke like   changes in vision; confusion; trouble speaking or understanding; severe headaches; sudden numbness or weakness of the face, arm or leg; trouble walking; dizziness; loss of balance or coordination ?Side effects that usually do not require medical attention (report to your doctor or health care professional if they continue or are  bothersome): ?chills ?cough ?dizziness ?fever ?headaches ?joint pain ?muscle cramps ?muscle pain ?nausea, vomiting ?pain, redness, or irritation at site where injected ?This list may not describe all possible side effects. Call your doctor for medical advice about side effects. You may report side effects to FDA at 1-800-FDA-1088. ?Where should I keep my medication? ?Keep out of the reach of children. ?Store in a refrigerator between 2 and 8 degrees C (36 and 46 degrees F). Do not freeze or shake. Throw away any unused portion if using a single-dose vial. Multi-dose vials can be kept in the refrigerator for up to 21 days after the initial dose. Throw away unused medicine. ?NOTE: This sheet is a summary. It may not cover all possible information. If you have questions about this medicine, talk to your doctor, pharmacist, or health care provider. ?? 2023 Elsevier/Gold Standard (2017-08-14 00:00:00) ? ?

## 2022-08-07 ENCOUNTER — Inpatient Hospital Stay: Payer: Medicare Other

## 2022-08-07 ENCOUNTER — Inpatient Hospital Stay: Payer: Medicare Other | Attending: Oncology

## 2022-08-07 VITALS — BP 123/58 | HR 70 | Temp 98.7°F | Resp 18 | Ht 63.0 in | Wt 142.8 lb

## 2022-08-07 DIAGNOSIS — N189 Chronic kidney disease, unspecified: Secondary | ICD-10-CM | POA: Insufficient documentation

## 2022-08-07 DIAGNOSIS — D464 Refractory anemia, unspecified: Secondary | ICD-10-CM | POA: Diagnosis not present

## 2022-08-07 DIAGNOSIS — D631 Anemia in chronic kidney disease: Secondary | ICD-10-CM | POA: Diagnosis not present

## 2022-08-07 LAB — CBC: RBC: 3.03 — AB (ref 3.87–5.11)

## 2022-08-07 LAB — CBC AND DIFFERENTIAL
HCT: 28 — AB (ref 36–46)
Hemoglobin: 9.6 — AB (ref 12.0–16.0)
Neutrophils Absolute: 4.84
Platelets: 185 10*3/uL (ref 150–400)
WBC: 8.2

## 2022-08-07 MED ORDER — EPOETIN ALFA-EPBX 40000 UNIT/ML IJ SOLN
40000.0000 [IU] | Freq: Once | INTRAMUSCULAR | Status: AC
  Administered 2022-08-07: 40000 [IU] via SUBCUTANEOUS
  Filled 2022-08-07: qty 1

## 2022-08-07 NOTE — Patient Instructions (Signed)

## 2022-08-24 DIAGNOSIS — E785 Hyperlipidemia, unspecified: Secondary | ICD-10-CM | POA: Diagnosis not present

## 2022-08-24 DIAGNOSIS — I10 Essential (primary) hypertension: Secondary | ICD-10-CM | POA: Diagnosis not present

## 2022-09-03 NOTE — Progress Notes (Signed)
[facility]  [address] [place],  [place]  [address] [phone]  Clinic Day:  09/06/2022  Referring physician: Ronita Hipps, MD   HISTORY OF PRESENT ILLNESS:  The patient is a 76 y.o. female with anemia secondary to chronic renal insufficiency.   Recent labs also show her to be mildly iron deficient.  Over these past few months, she has received both IV iron and Retacrit to bolster her hemoglobin.  She comes in today to reassess her anemia.  Since her last visit, the patient's been doing well.  She denies having increased fatigue or any overt forms of blood loss which concern her for progressive anemia.  PHYSICAL EXAM:  Blood pressure 135/62, pulse 64, temperature 98.6 F (37 C), resp. rate 14, height '5\' 3"'$  (1.6 m), weight 141 lb 12.8 oz (64.3 kg), SpO2 95 %. Wt Readings from Last 3 Encounters:  09/04/22 142 lb 4 oz (64.5 kg)  09/04/22 141 lb 12.8 oz (64.3 kg)  08/07/22 142 lb 12 oz (64.8 kg)   Body mass index is 25.12 kg/m. Performance status (ECOG): 1 - Symptomatic but completely ambulatory Physical Exam Constitutional:      Appearance: Normal appearance. She is not ill-appearing.  HENT:     Mouth/Throat:     Mouth: Mucous membranes are moist.     Pharynx: Oropharynx is clear. No oropharyngeal exudate or posterior oropharyngeal erythema.  Cardiovascular:     Rate and Rhythm: Normal rate and regular rhythm.     Heart sounds: No murmur heard.    No friction rub. No gallop.  Pulmonary:     Effort: Pulmonary effort is normal. No respiratory distress.     Breath sounds: Normal breath sounds. No wheezing, rhonchi or rales.  Abdominal:     General: Bowel sounds are normal. There is no distension.     Palpations: Abdomen is soft. There is no mass.     Tenderness: There is no abdominal tenderness.  Musculoskeletal:        General: No swelling.     Right lower leg: No edema.     Left lower leg: No edema.  Lymphadenopathy:     Cervical:  No cervical adenopathy.     Upper Body:     Right upper body: No supraclavicular or axillary adenopathy.     Left upper body: No supraclavicular or axillary adenopathy.     Lower Body: No right inguinal adenopathy. No left inguinal adenopathy.  Skin:    General: Skin is warm.     Coloration: Skin is not jaundiced.     Findings: No lesion or rash.  Neurological:     General: No focal deficit present.     Mental Status: She is alert and oriented to person, place, and time. Mental status is at baseline.  Psychiatric:        Mood and Affect: Mood normal.        Behavior: Behavior normal.        Thought Content: Thought content normal.     LABS:    Latest Reference Range & Units 09/04/22 00:00  WBC  6.8 (E)  RBC 3.87 - 5.11  3.14 ! (E)  Hemoglobin 12.0 - 16.0  10.1 ! (E)  HCT 36 - 46  30 ! (E)  Platelets 150 - 400 K/uL 155 (E)  NEUT#  4.35 (E)  !: Data is abnormal (E): External lab result  Latest Reference Range & Units 09/04/22 00:00  Sodium 137 -  147  132 ! (E)  Potassium 3.5 - 5.1 mEq/L 4.3 (E)  Chloride 99 - 108  95 ! (E)  CO2 13 - 22  28 ! (E)  Glucose  129 (E)  BUN 4 - 21  15 (E)  Creatinine 0.5 - 1.1  0.9 (E)  Calcium 8.7 - 10.7  9.6 (E)  Alkaline Phosphatase 25 - 125  73 (E)  Albumin 3.5 - 5.0  4.3 (E)  AST 13 - 35  39 ! (E)  ALT 7 - 35 U/L 20 (E)  Bilirubin, Total  0.5 (E)  !: Data is abnormal (E): External lab result  Latest Reference Range & Units 09/04/22 13:16  Iron 28 - 170 ug/dL 50  UIBC ug/dL 237  TIBC 250 - 450 ug/dL 287  Saturation Ratios 10.4 - 31.8 % 17  Ferritin 11 - 307 ng/mL 234   ASSESSMENT & PLAN:  Assessment/Plan:  A 76 y.o. female with anemia secondary to renal insufficiency.  Labs also showed evidence of mild iron deficiency being present.  I am pleased as her hemoglobin is better today at 10.  This is the highest it has been in quite some time, which likely reflects the benefit she received both iron and Retacrit.  Furthermore, her iron  parameters are actually improved over these past months, which may be another reason why her hemoglobin is gradually improving.  Clinically, she appears to be doing well.  Moving forward, her CBC will be checked once every 2 months.  I will see her back in 4 months for repeat clinical assessment.  The patient understands all the plans discussed today and is in agreement with them.    Thurma Priego Macarthur Critchley, MD

## 2022-09-04 ENCOUNTER — Other Ambulatory Visit: Payer: Self-pay | Admitting: Pharmacist

## 2022-09-04 ENCOUNTER — Inpatient Hospital Stay: Payer: Medicare Other | Attending: Oncology | Admitting: Oncology

## 2022-09-04 ENCOUNTER — Inpatient Hospital Stay: Payer: Medicare Other

## 2022-09-04 VITALS — BP 135/62 | HR 64 | Temp 98.6°F | Resp 14 | Ht 63.0 in | Wt 141.8 lb

## 2022-09-04 VITALS — BP 125/65 | HR 62 | Temp 97.6°F | Resp 18 | Ht 63.0 in | Wt 142.2 lb

## 2022-09-04 DIAGNOSIS — N183 Chronic kidney disease, stage 3 unspecified: Secondary | ICD-10-CM | POA: Diagnosis not present

## 2022-09-04 DIAGNOSIS — D464 Refractory anemia, unspecified: Secondary | ICD-10-CM | POA: Insufficient documentation

## 2022-09-04 DIAGNOSIS — D649 Anemia, unspecified: Secondary | ICD-10-CM | POA: Diagnosis not present

## 2022-09-04 DIAGNOSIS — D631 Anemia in chronic kidney disease: Secondary | ICD-10-CM

## 2022-09-04 DIAGNOSIS — N189 Chronic kidney disease, unspecified: Secondary | ICD-10-CM | POA: Diagnosis not present

## 2022-09-04 LAB — CBC AND DIFFERENTIAL
HCT: 30 — AB (ref 36–46)
Hemoglobin: 10.1 — AB (ref 12.0–16.0)
Neutrophils Absolute: 4.35
Platelets: 155 10*3/uL (ref 150–400)
WBC: 6.8

## 2022-09-04 LAB — COMPREHENSIVE METABOLIC PANEL
Albumin: 4.3 (ref 3.5–5.0)
Calcium: 9.6 (ref 8.7–10.7)

## 2022-09-04 LAB — BASIC METABOLIC PANEL
BUN: 15 (ref 4–21)
CO2: 28 — AB (ref 13–22)
Chloride: 95 — AB (ref 99–108)
Creatinine: 0.9 (ref 0.5–1.1)
Glucose: 129
Potassium: 4.3 mEq/L (ref 3.5–5.1)
Sodium: 132 — AB (ref 137–147)

## 2022-09-04 LAB — HEPATIC FUNCTION PANEL
ALT: 20 U/L (ref 7–35)
AST: 39 — AB (ref 13–35)
Alkaline Phosphatase: 73 (ref 25–125)
Bilirubin, Total: 0.5

## 2022-09-04 LAB — CBC: RBC: 3.14 — AB (ref 3.87–5.11)

## 2022-09-04 LAB — IRON AND TIBC
Iron: 50 ug/dL (ref 28–170)
Saturation Ratios: 17 % (ref 10.4–31.8)
TIBC: 287 ug/dL (ref 250–450)
UIBC: 237 ug/dL

## 2022-09-04 LAB — FERRITIN: Ferritin: 234 ng/mL (ref 11–307)

## 2022-09-04 MED ORDER — EPOETIN ALFA-EPBX 40000 UNIT/ML IJ SOLN
40000.0000 [IU] | Freq: Once | INTRAMUSCULAR | Status: AC
  Administered 2022-09-04: 40000 [IU] via SUBCUTANEOUS
  Filled 2022-09-04: qty 1

## 2022-09-04 NOTE — Patient Instructions (Signed)

## 2022-09-05 DIAGNOSIS — Z1231 Encounter for screening mammogram for malignant neoplasm of breast: Secondary | ICD-10-CM | POA: Diagnosis not present

## 2022-09-06 ENCOUNTER — Encounter: Payer: Self-pay | Admitting: Oncology

## 2022-09-26 ENCOUNTER — Other Ambulatory Visit: Payer: Self-pay | Admitting: Cardiology

## 2022-09-26 DIAGNOSIS — H2703 Aphakia, bilateral: Secondary | ICD-10-CM | POA: Diagnosis not present

## 2022-09-27 DIAGNOSIS — Z23 Encounter for immunization: Secondary | ICD-10-CM | POA: Diagnosis not present

## 2022-10-02 ENCOUNTER — Inpatient Hospital Stay: Payer: Medicare Other | Attending: Oncology

## 2022-10-02 ENCOUNTER — Inpatient Hospital Stay: Payer: Medicare Other

## 2022-10-02 VITALS — BP 133/73 | HR 67 | Temp 97.8°F | Resp 17 | Wt 145.0 lb

## 2022-10-02 DIAGNOSIS — D649 Anemia, unspecified: Secondary | ICD-10-CM | POA: Diagnosis not present

## 2022-10-02 DIAGNOSIS — D464 Refractory anemia, unspecified: Secondary | ICD-10-CM | POA: Diagnosis not present

## 2022-10-02 DIAGNOSIS — N189 Chronic kidney disease, unspecified: Secondary | ICD-10-CM | POA: Insufficient documentation

## 2022-10-02 DIAGNOSIS — D631 Anemia in chronic kidney disease: Secondary | ICD-10-CM

## 2022-10-02 DIAGNOSIS — N183 Chronic kidney disease, stage 3 unspecified: Secondary | ICD-10-CM | POA: Insufficient documentation

## 2022-10-02 LAB — COMPREHENSIVE METABOLIC PANEL
Albumin: 4.5 (ref 3.5–5.0)
Calcium: 9.7 (ref 8.7–10.7)

## 2022-10-02 LAB — BASIC METABOLIC PANEL
BUN: 26 — AB (ref 4–21)
CO2: 29 — AB (ref 13–22)
Chloride: 92 — AB (ref 99–108)
Creatinine: 0.9 (ref 0.5–1.1)
Glucose: 131
Potassium: 4.7 mEq/L (ref 3.5–5.1)
Sodium: 126 — AB (ref 137–147)

## 2022-10-02 LAB — HEPATIC FUNCTION PANEL
ALT: 22 U/L (ref 7–35)
AST: 31 (ref 13–35)
Alkaline Phosphatase: 93 (ref 25–125)
Bilirubin, Total: 0.4

## 2022-10-02 LAB — CBC AND DIFFERENTIAL
HCT: 28 — AB (ref 36–46)
Hemoglobin: 9.8 — AB (ref 12.0–16.0)
Neutrophils Absolute: 4.51
Platelets: 174 10*3/uL (ref 150–400)
WBC: 7.4

## 2022-10-02 LAB — CBC: RBC: 3.04 — AB (ref 3.87–5.11)

## 2022-10-02 MED ORDER — EPOETIN ALFA-EPBX 40000 UNIT/ML IJ SOLN
40000.0000 [IU] | Freq: Once | INTRAMUSCULAR | Status: AC
  Administered 2022-10-02: 40000 [IU] via SUBCUTANEOUS
  Filled 2022-10-02: qty 1

## 2022-10-02 NOTE — Patient Instructions (Signed)

## 2022-10-24 ENCOUNTER — Ambulatory Visit: Payer: Medicare Other | Attending: Cardiology | Admitting: Cardiology

## 2022-10-24 ENCOUNTER — Encounter: Payer: Self-pay | Admitting: Cardiology

## 2022-10-24 VITALS — BP 120/62 | HR 73 | Ht 63.0 in | Wt 146.0 lb

## 2022-10-24 DIAGNOSIS — E785 Hyperlipidemia, unspecified: Secondary | ICD-10-CM | POA: Insufficient documentation

## 2022-10-24 DIAGNOSIS — N184 Chronic kidney disease, stage 4 (severe): Secondary | ICD-10-CM | POA: Insufficient documentation

## 2022-10-24 DIAGNOSIS — I10 Essential (primary) hypertension: Secondary | ICD-10-CM | POA: Insufficient documentation

## 2022-10-24 DIAGNOSIS — D509 Iron deficiency anemia, unspecified: Secondary | ICD-10-CM | POA: Insufficient documentation

## 2022-10-24 DIAGNOSIS — I25119 Atherosclerotic heart disease of native coronary artery with unspecified angina pectoris: Secondary | ICD-10-CM | POA: Insufficient documentation

## 2022-10-24 MED ORDER — ISOSORBIDE MONONITRATE ER 30 MG PO TB24: 30.0000 mg | ORAL_TABLET | Freq: Every day | ORAL | 3 refills | Status: DC

## 2022-10-24 NOTE — Progress Notes (Signed)
Cardiology Office Note:    Date:  10/24/2022   ID:  Angela Mccoy, DOB September 06, 1946, MRN [id number]  PCP:  Ronita Hipps, MD  Cardiologist:  [name], MD    Referring MD: Ronita Hipps, MD    ASSESSMENT:    1. Coronary artery disease involving native coronary artery of native heart with angina pectoris ([place])   2. Essential hypertension   3. Hyperlipidemia, unspecified hyperlipidemia type   4. Iron deficiency anemia, unspecified iron deficiency anemia type   5. CKD (chronic kidney disease) stage 4, GFR 15-29 ml/min (HCC)    PLAN:    In order of problems listed above:  Phoebe continues to do well with CAD following her most recent PCI for in-stent restenosis 10/18/2021.  She is having no angina we will continue antiplatelet therapy with clopidogrel stop aspirin and continue her beta-blocker along with statin. Well-controlled she will record trend blood pressures at home bring to the office next time with her and continue beta-blocker diuretic.  Most recent labs 10/02/2022 shows a creatinine 0.9 potassium 4.7. Continue her statin LDL is at target And improved but still anemic and I think it is best to drop out aspirin with iron deficiency Stable kidney function   Next appointment: 9 months   Medication Adjustments/Labs and Tests Ordered: Current medicines are reviewed at length with the patient today.  Concerns regarding medicines are outlined above.  No orders of the defined types were placed in this encounter.  No orders of the defined types were placed in this encounter.   Chief Complaint  Patient presents with   Follow-up   Coronary Artery Disease    History of Present Illness:    Angela Mccoy is a 76 y.o. female with a hx of CAD with remote PCI and drug-eluting stent to the LAD and right coronary artery in 2003 hypertension dyslipidemia chronic anemia chronic lower extremity swelling last seen 04/26/2022.She underwent a myocardial  perfusion study 05/26/2001 showing normal perfusion EF 67% no ischemic ST changes low risk.  She has anemia of chronic renal disease and has been receiving erythropoietin and Dr. Beckie Salts hematology oncology.  Most recent hemoglobin so 09/04/2022 ferritin 234 iron of 50 and saturation remained  diminished at 17% Compliance with diet, lifestyle and medications: Yes  Alis is doing much better her weakness and fatigue has improved with improvement of her anemia. No angina edema shortness of breath palpitation or syncope She did tolerates her lipid-lowering therapy without muscle pain or weakness With her ongoing anemia being 1 year remote from PCI working to stop aspirin and continue clopidogrel monotherapy Blood pressure consistently less than 1 50-5 39J systolic Past Medical History:  Diagnosis Date   Anemia 07/31/2017   Anemia in chronic kidney disease 10/03/2020   Anxiety and depression    Carotid bruit 02/26/2016   Carotid bruit present 02/26/2016   Cavitary pneumonia 03/20/2019   Onset of symptoms around late  Feb 2020 > all symptoms resolved p 5 days Meropenem and rx x 2 weeks then changed to cleocin -  D/c cleocin 03/20/2019  With esr 12, wbc 7,800  -  Quant TB 03/20/2019  Neg  - 05/14/2019 cxr with minimal streaky residual, no as dz    Coronary artery disease involving native coronary artery 02/26/2016   Coronary artery disease involving native coronary artery of native heart with angina pectoris ([place]) 03/12/2016   Overview:  PCI and DES to LAD and RCA in 2003, cath 2010  wo restenosis   Essential hypertension 03/12/2016   GERD (gastroesophageal reflux disease)    High cholesterol 01/17/2017   History of colon polyps    Hx of diverticulitis of colon 09/11/2016   Hyperlipidemia 03/12/2016   Hypertension 01/27/2016   IBS (irritable bowel syndrome)    Insomnia 02/26/2016   Migraine with aura 02/26/2016   Mitral valve prolapse 02/26/2016   Peripheral vascular disease ([facility])  02/26/2016   Persistent atrial fibrillation ([place]) 11/06/2019   Pneumonia of right upper lobe due to infectious organism 02/26/2019   Pre-op evaluation 11/19/2019   Rheumatoid arthritis involving multiple joints ([facility]) 02/26/2016   Rheumatoid arthritis involving multiple sites ([facility]) 02/26/2016    Past Surgical History:  Procedure Laterality Date   ABDOMINAL HYSTERECTOMY     CHOLECYSTECTOMY     COLONOSCOPY  10/21/2014   Moderate predominantly sigmod diverticulosis. Small internal hemorrhoids with evidence of previous anorectal surgery (no active bleeding)   CORONARY STENT INTERVENTION N/A 10/18/2021   Procedure: CORONARY STENT INTERVENTION;  Surgeon: Leonie Man, MD;  Location: [facility];  Service: Cardiovascular;  Laterality: N/A;   ESOPHAGOGASTRODUODENOSCOPY  09/25/2012   Normal EGD   heart stents     x 4   LEFT HEART CATH AND CORONARY ANGIOGRAPHY N/A 10/18/2021   Procedure: LEFT HEART CATH AND CORONARY ANGIOGRAPHY;  Surgeon: Leonie Man, MD;  Location: [facility];  Service: Cardiovascular;  Laterality: N/A;   rectal fissure      Current Medications: Current Meds  Medication Sig   aspirin EC 81 MG tablet Take 1 tablet (81 mg total) by mouth daily. Swallow whole.   carvedilol (COREG) 6.25 MG tablet TAKE 1 TABLET(6.25 MG) BY MOUTH TWICE DAILY WITH A MEAL   chlorthalidone (HYGROTON) 25 MG tablet Take 25 mg by mouth daily.   citalopram (CELEXA) 10 MG tablet Take 10 mg by mouth daily.   clopidogrel (PLAVIX) 75 MG tablet TAKE 1 TABLET(75 MG) BY MOUTH DAILY   isosorbide mononitrate (IMDUR) 30 MG 24 hr tablet Take 30 mg by mouth daily.   Multiple Vitamins-Minerals (MULTIVITAMIN WITH MINERALS) tablet Take 1 tablet by mouth daily.   nitroGLYCERIN (NITROSTAT) 0.4 MG SL tablet ONE TABLET UNDER TONGUE AS NEEDED FOR CHEST PAIN EVERY 5 MINUTES   ondansetron (ZOFRAN) 4 MG tablet Take 1 tablet (4 mg total) by mouth every 8 (eight) hours as needed for nausea or vomiting.    pantoprazole (PROTONIX) 40 MG tablet Take 40 mg by mouth 2 (two) times daily.   potassium chloride SA (KLOR-CON) 20 MEQ tablet Take 20 mEq by mouth 2 (two) times daily.   pravastatin (PRAVACHOL) 40 MG tablet Take 1 tablet (40 mg total) by mouth every evening.   QUEtiapine (SEROQUEL) 300 MG tablet Take 300 mg by mouth at bedtime.   traMADol (ULTRAM) 50 MG tablet Take 50 mg by mouth 3 (three) times daily.   valsartan (DIOVAN) 80 MG tablet TAKE 1 TABLET(80 MG) BY MOUTH DAILY     Allergies:   Penicillins   Social History   Socioeconomic History   Marital status: Married    Spouse name: Not on file   Number of children: 2   Years of education: Not on file   Highest education level: Not on file  Occupational History   Occupation: Proofreader  Tobacco Use   Smoking status: Former    Packs/day: 1.00    Years: 18.00    Total pack years: 18.00    Types: Cigarettes  Quit date: 12/26/1987    Years since quitting: 34.8   Smokeless tobacco: Never  Vaping Use   Vaping Use: Never used  Substance and Sexual Activity   Alcohol use: No   Drug use: Never   Sexual activity: Not on file  Other Topics Concern   Not on file  Social History Narrative   Not on file   Social Determinants of Health   Financial Resource Strain: Not on file  Food Insecurity: Not on file  Transportation Needs: Not on file  Physical Activity: Not on file  Stress: Not on file  Social Connections: Not on file     Family History: The patient's family history includes Alzheimer's disease in her father; Breast cancer in her sister; Heart attack in her brother, mother, and sister; Lung cancer in her brother; Stroke in her maternal grandmother. There is no history of Asthma, Colon cancer, Rectal cancer, Stomach cancer, Esophageal cancer, Pancreatic cancer, or Liver cancer. ROS:   Please see the history of present illness.    All other systems reviewed and are negative.  EKGs/Labs/Other Studies Reviewed:     The following studies were reviewed today:  EKG:  EKG ordered today and personally reviewed.  The ekg ordered today demonstrates sinus rhythm minor nonspecific ST abnormality otherwise normal EKG  Recent Labs: 10/02/2022: ALT 22; BUN 26; Creatinine 0.9; Hemoglobin 9.8; Platelets 174; Potassium 4.7; Sodium 126  Recent Lipid Panel    Component Value Date/Time   CHOL 145 10/10/2021 0938   TRIG 248 (H) 10/10/2021 0938   HDL 42 10/10/2021 0938   CHOLHDL 3.5 10/10/2021 0938   LDLCALC 63 10/10/2021 0938    Physical Exam:    VS:  BP 120/62 (BP Location: Right Arm, Patient Position: Sitting)   Pulse 73   Ht _0  (1.6 m)   Wt 146 lb (66.2 kg)   SpO2 96%   BMI 25.86 kg/m     Wt Readings from Last 3 Encounters:  10/24/22 146 lb (66.2 kg)  10/02/22 145 lb 0.6 oz (65.8 kg)  09/04/22 142 lb 4 oz (64.5 kg)     GEN:  Well nourished, well developed in no acute distress HEENT: Normal NECK: No JVD; No carotid bruits LYMPHATICS: No lymphadenopathy CARDIAC: RRR, no murmurs, rubs, gallops RESPIRATORY:  Clear to auscultation without rales, wheezing or rhonchi  ABDOMEN: Soft, non-tender, non-distended MUSCULOSKELETAL:  No edema; No deformity  SKIN: Warm and dry NEUROLOGIC:  Alert and oriented x 3 PSYCHIATRIC:  Normal affect    Signed, [name], MD  10/24/2022 8:41 AM    [place]

## 2022-10-24 NOTE — Patient Instructions (Addendum)
Medication Instructions:  Your physician has recommended you make the following change in your medication:  Stop taking Aspirin  *If you need a refill on your cardiac medications before your next appointment, please call your pharmacy*   Lab Work: NONE If you have labs (blood work) drawn today and your tests are completely normal, you will receive your results only by: [facility] (if you have MyChart) OR A paper copy in the mail If you have any lab test that is abnormal or we need to change your treatment, we will call you to review the results.   Testing/Procedures: NONE   Follow-Up: At [facility], you and your health needs are our priority.  As part of our continuing mission to provide you with exceptional heart care, we have created designated Provider Care Teams.  These Care Teams include your primary Cardiologist (physician) and Advanced Practice Providers (APPs -  Physician Assistants and Nurse Practitioners) who all work together to provide you with the care you need, when you need it.  We recommend signing up for the patient portal called "MyChart".  Sign up information is provided on this After Visit Summary.  MyChart is used to connect with patients for Virtual Visits (Telemedicine).  Patients are able to view lab/test results, encounter notes, upcoming appointments, etc.  Non-urgent messages can be sent to your provider as well.   To learn more about what you can do with MyChart, go to [email or web address].    Your next appointment:   9 month(s)  The format for your next appointment:   In Person  Provider:   [name], MD    Other Instructions Check and Record BP at home  Important Information About Sugar

## 2022-10-27 DIAGNOSIS — Z6826 Body mass index (BMI) 26.0-26.9, adult: Secondary | ICD-10-CM | POA: Diagnosis not present

## 2022-10-27 DIAGNOSIS — M79672 Pain in left foot: Secondary | ICD-10-CM | POA: Diagnosis not present

## 2022-10-29 ENCOUNTER — Other Ambulatory Visit: Payer: Self-pay | Admitting: Student

## 2022-10-30 ENCOUNTER — Other Ambulatory Visit: Payer: Self-pay | Admitting: Oncology

## 2022-10-30 ENCOUNTER — Inpatient Hospital Stay: Payer: Medicare Other

## 2022-10-30 ENCOUNTER — Inpatient Hospital Stay: Payer: Medicare Other | Attending: Oncology

## 2022-10-30 DIAGNOSIS — N189 Chronic kidney disease, unspecified: Secondary | ICD-10-CM | POA: Diagnosis not present

## 2022-10-30 DIAGNOSIS — D464 Refractory anemia, unspecified: Secondary | ICD-10-CM | POA: Insufficient documentation

## 2022-10-30 DIAGNOSIS — D631 Anemia in chronic kidney disease: Secondary | ICD-10-CM

## 2022-10-30 DIAGNOSIS — N183 Chronic kidney disease, stage 3 unspecified: Secondary | ICD-10-CM | POA: Insufficient documentation

## 2022-10-30 LAB — CBC WITH DIFFERENTIAL (CANCER CENTER ONLY)
Abs Immature Granulocytes: 0.06 10*3/uL (ref 0.00–0.07)
Basophils Absolute: 0.1 10*3/uL (ref 0.0–0.1)
Basophils Relative: 1 %
Eosinophils Absolute: 0.4 10*3/uL (ref 0.0–0.5)
Eosinophils Relative: 4 %
HCT: 27.7 % — ABNORMAL LOW (ref 36.0–46.0)
Hemoglobin: 9 g/dL — ABNORMAL LOW (ref 12.0–15.0)
Immature Granulocytes: 1 %
Lymphocytes Relative: 14 %
Lymphs Abs: 1.3 10*3/uL (ref 0.7–4.0)
MCH: 31.6 pg (ref 26.0–34.0)
MCHC: 32.5 g/dL (ref 30.0–36.0)
MCV: 97.2 fL (ref 80.0–100.0)
Monocytes Absolute: 0.6 10*3/uL (ref 0.1–1.0)
Monocytes Relative: 6 %
Neutro Abs: 6.7 10*3/uL (ref 1.7–7.7)
Neutrophils Relative %: 74 %
Platelet Count: 186 10*3/uL (ref 150–400)
RBC: 2.85 MIL/uL — ABNORMAL LOW (ref 3.87–5.11)
RDW: 15 % (ref 11.5–15.5)
WBC Count: 9 10*3/uL (ref 4.0–10.5)
nRBC: 0 % (ref 0.0–0.2)

## 2022-10-31 ENCOUNTER — Inpatient Hospital Stay: Payer: Medicare Other

## 2022-10-31 VITALS — Ht 63.0 in | Wt 144.8 lb

## 2022-10-31 DIAGNOSIS — N189 Chronic kidney disease, unspecified: Secondary | ICD-10-CM | POA: Diagnosis not present

## 2022-10-31 DIAGNOSIS — D631 Anemia in chronic kidney disease: Secondary | ICD-10-CM | POA: Diagnosis not present

## 2022-10-31 DIAGNOSIS — N183 Chronic kidney disease, stage 3 unspecified: Secondary | ICD-10-CM | POA: Diagnosis not present

## 2022-10-31 DIAGNOSIS — D464 Refractory anemia, unspecified: Secondary | ICD-10-CM | POA: Diagnosis not present

## 2022-10-31 MED ORDER — EPOETIN ALFA-EPBX 40000 UNIT/ML IJ SOLN
40000.0000 [IU] | Freq: Once | INTRAMUSCULAR | Status: AC
  Administered 2022-10-31: 40000 [IU] via SUBCUTANEOUS
  Filled 2022-10-31: qty 1

## 2022-10-31 NOTE — Patient Instructions (Signed)

## 2022-11-09 ENCOUNTER — Telehealth: Payer: Self-pay | Admitting: Cardiology

## 2022-11-09 NOTE — Telephone Encounter (Signed)
Pt c/o medication issue:  1. Name of Medication: atorvastatin (LIPITOR) 40 MG tablet   2. How are you currently taking this medication (dosage and times per day)?   3. Are you having a reaction (difficulty breathing--STAT)?   4. What is your medication issue? Pt states that insurance is not wanting to cover this medication and would like a call back to discuss other options. Please advise.

## 2022-11-10 NOTE — Telephone Encounter (Signed)
Pt states that her insurance is not wanting to pay for the Atorvastatin. When talking with pt she stated that she is on another cholesterol medication which is Pravastatin. Pt has been taking both medications since her stent. Please advise.

## 2022-11-13 ENCOUNTER — Other Ambulatory Visit: Payer: Self-pay

## 2022-11-13 MED ORDER — ATORVASTATIN CALCIUM 40 MG PO TABS: 40.0000 mg | ORAL_TABLET | Freq: Every day | ORAL | 3 refills | Status: DC

## 2022-11-13 NOTE — Telephone Encounter (Signed)
Called patient and informed her that she should not be taking 2 statins at the same time, per Dr. Bettina Gavia. Dr. Bettina Gavia would like her to stop taking her pravastatin and continue taking her atorvastatin. Patient was agreeable with this plan and had no further questions at this time.

## 2022-11-24 ENCOUNTER — Encounter: Payer: Self-pay | Admitting: Oncology

## 2022-11-27 ENCOUNTER — Inpatient Hospital Stay: Payer: Medicare Other | Attending: Oncology

## 2022-11-27 ENCOUNTER — Inpatient Hospital Stay: Payer: Medicare Other

## 2022-11-27 VITALS — BP 192/69 | HR 69 | Temp 98.5°F | Resp 18 | Ht 63.0 in | Wt 142.5 lb

## 2022-11-27 DIAGNOSIS — N183 Chronic kidney disease, stage 3 unspecified: Secondary | ICD-10-CM

## 2022-11-27 DIAGNOSIS — D649 Anemia, unspecified: Secondary | ICD-10-CM | POA: Diagnosis not present

## 2022-11-27 DIAGNOSIS — D631 Anemia in chronic kidney disease: Secondary | ICD-10-CM | POA: Diagnosis not present

## 2022-11-27 DIAGNOSIS — N189 Chronic kidney disease, unspecified: Secondary | ICD-10-CM | POA: Diagnosis not present

## 2022-11-27 LAB — CMP (CANCER CENTER ONLY)
ALT: 22 U/L (ref 0–44)
AST: 23 U/L (ref 15–41)
Albumin: 4.5 g/dL (ref 3.5–5.0)
Alkaline Phosphatase: 84 U/L (ref 38–126)
Anion gap: 11 (ref 5–15)
BUN: 31 mg/dL — ABNORMAL HIGH (ref 8–23)
CO2: 26 mmol/L (ref 22–32)
Calcium: 9.8 mg/dL (ref 8.9–10.3)
Chloride: 94 mmol/L — ABNORMAL LOW (ref 98–111)
Creatinine: 1.15 mg/dL — ABNORMAL HIGH (ref 0.44–1.00)
GFR, Estimated: 50 mL/min — ABNORMAL LOW (ref >60)
Glucose, Bld: 101 mg/dL — ABNORMAL HIGH (ref 70–99)
Potassium: 4.3 mmol/L (ref 3.5–5.1)
Sodium: 131 mmol/L — ABNORMAL LOW (ref 135–145)
Total Bilirubin: 0.7 mg/dL (ref 0.3–1.2)
Total Protein: 8.4 g/dL — ABNORMAL HIGH (ref 6.5–8.1)

## 2022-11-27 LAB — CBC AND DIFFERENTIAL
HCT: 29 — AB (ref 36–46)
Hemoglobin: 10 — AB (ref 12.0–16.0)
Neutrophils Absolute: 3.9
Platelets: 184 10*3/uL (ref 150–400)
WBC: 6.1

## 2022-11-27 LAB — CBC: RBC: 3.2 — AB (ref 3.87–5.11)

## 2022-11-27 LAB — SAMPLE TO BLOOD BANK

## 2022-11-27 MED ORDER — EPOETIN ALFA-EPBX 40000 UNIT/ML IJ SOLN: 40000.0000 [IU] | Freq: Once | INTRAMUSCULAR | Status: DC

## 2022-11-27 NOTE — Progress Notes (Signed)
Ulice Dash, rph notified of pt's bp's.  Pt did not take bp meds this morning.  States she normally takes them at 10 and then stated "no wonder my head has been hurting".  Per Manuela Schwartz, pt given option to skip this month's retacrit (hgb 10.0 this am) or to reschedule dose for a couple days out and to make sure she takes her bp meds prior to coming.  Pt states she will reschedule.  [facility] notified to call pt to reschedule appt.

## 2022-11-29 ENCOUNTER — Encounter: Payer: Self-pay | Admitting: Oncology

## 2022-11-30 ENCOUNTER — Inpatient Hospital Stay: Payer: Medicare Other

## 2022-11-30 VITALS — BP 114/56 | HR 77 | Temp 97.9°F | Resp 18 | Ht 63.0 in | Wt 143.8 lb

## 2022-11-30 DIAGNOSIS — D631 Anemia in chronic kidney disease: Secondary | ICD-10-CM | POA: Diagnosis not present

## 2022-11-30 DIAGNOSIS — N183 Chronic kidney disease, stage 3 unspecified: Secondary | ICD-10-CM | POA: Diagnosis not present

## 2022-11-30 MED ORDER — EPOETIN ALFA-EPBX 40000 UNIT/ML IJ SOLN
40000.0000 [IU] | Freq: Once | INTRAMUSCULAR | Status: AC
  Administered 2022-11-30: 40000 [IU] via SUBCUTANEOUS
  Filled 2022-11-30: qty 1

## 2022-11-30 NOTE — Patient Instructions (Signed)

## 2022-12-22 ENCOUNTER — Encounter: Payer: Self-pay | Admitting: Oncology

## 2022-12-25 NOTE — Progress Notes (Signed)
[facility]  [address] [place],  [place]  [address] [phone]  Clinic Day:  12/26/2022  Referring physician: Ronita Hipps, MD   HISTORY OF PRESENT ILLNESS:  The patient is a 77 y.o. female with anemia secondary to chronic renal insufficiency.   She also has had issues with iron deficiency anemia to where IV iron was given in 2023.  She intermittently receives Retacrit to bolster her hemoglobin.  She comes in today to reassess her anemia.  Since her last visit, the patient's been doing well.  She denies having increased fatigue or any overt forms of blood loss which concern her for progressive anemia.  PHYSICAL EXAM:  Blood pressure (!) 188/75, pulse 73, temperature 99.1 F (37.3 C), resp. rate 14, height '5\' 3"'$  (1.6 m), weight 144 lb 11.2 oz (65.6 kg), SpO2 92 %. Wt Readings from Last 3 Encounters:  12/26/22 144 lb 11.2 oz (65.6 kg)  12/26/22 144 lb 11.2 oz (65.6 kg)  11/30/22 143 lb 12 oz (65.2 kg)   Body mass index is 25.63 kg/m. Performance status (ECOG): 1 - Symptomatic but completely ambulatory Physical Exam Constitutional:      Appearance: Normal appearance. She is not ill-appearing.  HENT:     Mouth/Throat:     Mouth: Mucous membranes are moist.     Pharynx: Oropharynx is clear. No oropharyngeal exudate or posterior oropharyngeal erythema.  Cardiovascular:     Rate and Rhythm: Normal rate and regular rhythm.     Heart sounds: No murmur heard.    No friction rub. No gallop.  Pulmonary:     Effort: Pulmonary effort is normal. No respiratory distress.     Breath sounds: Normal breath sounds. No wheezing, rhonchi or rales.  Abdominal:     General: Bowel sounds are normal. There is no distension.     Palpations: Abdomen is soft. There is no mass.     Tenderness: There is no abdominal tenderness.  Musculoskeletal:        General: No swelling.     Right lower leg: No edema.     Left lower leg: No edema.  Lymphadenopathy:      Cervical: No cervical adenopathy.     Upper Body:     Right upper body: No supraclavicular or axillary adenopathy.     Left upper body: No supraclavicular or axillary adenopathy.     Lower Body: No right inguinal adenopathy. No left inguinal adenopathy.  Skin:    General: Skin is warm.     Coloration: Skin is not jaundiced.     Findings: No lesion or rash.  Neurological:     General: No focal deficit present.     Mental Status: She is alert and oriented to person, place, and time. Mental status is at baseline.  Psychiatric:        Mood and Affect: Mood normal.        Behavior: Behavior normal.        Thought Content: Thought content normal.     LABS:    Latest Reference Range & Units 12/26/22 09:15  Sodium 135 - 145 mmol/L 127 (L)  Potassium 3.5 - 5.1 mmol/L 5.1  Chloride 98 - 111 mmol/L 91 (L)  CO2 22 - 32 mmol/L 27  Glucose 70 - 99 mg/dL 104 (H)  BUN 8 - 23 mg/dL 39 (H)  Creatinine 0.44 - 1.00 mg/dL 1.56 (H)  Calcium 8.9 - 10.3 mg/dL 9.5  Anion gap 5 - 15  9  Alkaline Phosphatase 38 - 126 U/L 73  Albumin 3.5 - 5.0 g/dL 4.5  AST 15 - 41 U/L 21  ALT 0 - 44 U/L 15  Total Protein 6.5 - 8.1 g/dL 8.3 (H)  Total Bilirubin 0.3 - 1.2 mg/dL 0.5  GFR, Est Non African American >60 mL/min 34 (L)  (L): Data is abnormally low (H): Data is abnormally high   Latest Reference Range & Units 12/26/22 09:15  Iron 28 - 170 ug/dL 63  UIBC ug/dL 252  TIBC 250 - 450 ug/dL 315  Saturation Ratios 10.4 - 31.8 % 20  Ferritin 11 - 307 ng/mL 307   ASSESSMENT & PLAN:  Assessment/Plan:  A 77 y.o. female with anemia secondary to renal insufficiency.  As her hemoglobin is less than 10 and her iron parameters are normal, I will reinitiate Retacrit shots at 20,000 units monthly.  She will receive her next dose sometime this week.  Her CBC will continue to be followed monthly to ensure there is no precipitous decline in her hemoglobin.  I will see her back in 3 months for repeat clinical assessment.   The patient understands all the plans discussed today and is in agreement with them.    Helaine Yackel Macarthur Critchley, MD

## 2022-12-26 ENCOUNTER — Other Ambulatory Visit: Payer: Self-pay | Admitting: Oncology

## 2022-12-26 ENCOUNTER — Inpatient Hospital Stay (INDEPENDENT_AMBULATORY_CARE_PROVIDER_SITE_OTHER): Payer: Medicare Other | Admitting: Oncology

## 2022-12-26 ENCOUNTER — Telehealth: Payer: Self-pay | Admitting: Oncology

## 2022-12-26 ENCOUNTER — Inpatient Hospital Stay: Payer: Medicare Other

## 2022-12-26 ENCOUNTER — Inpatient Hospital Stay: Payer: Medicare Other | Attending: Oncology

## 2022-12-26 VITALS — BP 156/63 | HR 61 | Temp 98.5°F | Resp 18 | Ht 63.0 in | Wt 144.7 lb

## 2022-12-26 VITALS — BP 188/75 | HR 73 | Temp 99.1°F | Resp 14 | Ht 63.0 in | Wt 144.7 lb

## 2022-12-26 DIAGNOSIS — N189 Chronic kidney disease, unspecified: Secondary | ICD-10-CM

## 2022-12-26 DIAGNOSIS — N183 Chronic kidney disease, stage 3 unspecified: Secondary | ICD-10-CM | POA: Insufficient documentation

## 2022-12-26 DIAGNOSIS — D631 Anemia in chronic kidney disease: Secondary | ICD-10-CM | POA: Diagnosis not present

## 2022-12-26 DIAGNOSIS — D649 Anemia, unspecified: Secondary | ICD-10-CM | POA: Diagnosis not present

## 2022-12-26 LAB — CMP (CANCER CENTER ONLY)
ALT: 15 U/L (ref 0–44)
AST: 21 U/L (ref 15–41)
Albumin: 4.5 g/dL (ref 3.5–5.0)
Alkaline Phosphatase: 73 U/L (ref 38–126)
Anion gap: 9 (ref 5–15)
BUN: 39 mg/dL — ABNORMAL HIGH (ref 8–23)
CO2: 27 mmol/L (ref 22–32)
Calcium: 9.5 mg/dL (ref 8.9–10.3)
Chloride: 91 mmol/L — ABNORMAL LOW (ref 98–111)
Creatinine: 1.56 mg/dL — ABNORMAL HIGH (ref 0.44–1.00)
GFR, Estimated: 34 mL/min — ABNORMAL LOW (ref >60)
Glucose, Bld: 104 mg/dL — ABNORMAL HIGH (ref 70–99)
Potassium: 5.1 mmol/L (ref 3.5–5.1)
Sodium: 127 mmol/L — ABNORMAL LOW (ref 135–145)
Total Bilirubin: 0.5 mg/dL (ref 0.3–1.2)
Total Protein: 8.3 g/dL — ABNORMAL HIGH (ref 6.5–8.1)

## 2022-12-26 LAB — IRON AND TIBC
Iron: 63 ug/dL (ref 28–170)
Saturation Ratios: 20 % (ref 10.4–31.8)
TIBC: 315 ug/dL (ref 250–450)
UIBC: 252 ug/dL

## 2022-12-26 LAB — CBC AND DIFFERENTIAL
HCT: 28 — AB (ref 36–46)
Hemoglobin: 9.7 — AB (ref 12.0–16.0)
Neutrophils Absolute: 4.16
Platelets: 193 10*3/uL (ref 150–400)
WBC: 6.6

## 2022-12-26 LAB — FERRITIN: Ferritin: 307 ng/mL (ref 11–307)

## 2022-12-26 LAB — CBC: RBC: 3.05 — AB (ref 3.87–5.11)

## 2022-12-26 MED ORDER — EPOETIN ALFA-EPBX 40000 UNIT/ML IJ SOLN
40000.0000 [IU] | Freq: Once | INTRAMUSCULAR | Status: AC
  Administered 2022-12-26: 40000 [IU] via SUBCUTANEOUS
  Filled 2022-12-26: qty 1

## 2022-12-26 NOTE — Telephone Encounter (Signed)
Patient has been scheduled for follow-up visit per 12/26/22 LOS.  Pt given an appt calendar with date and time.

## 2022-12-26 NOTE — Patient Instructions (Signed)

## 2022-12-27 DIAGNOSIS — J40 Bronchitis, not specified as acute or chronic: Secondary | ICD-10-CM | POA: Diagnosis not present

## 2023-01-23 ENCOUNTER — Inpatient Hospital Stay: Payer: Medicare Other

## 2023-01-25 ENCOUNTER — Encounter: Payer: Self-pay | Admitting: Oncology

## 2023-01-25 ENCOUNTER — Inpatient Hospital Stay: Payer: Medicare Other

## 2023-01-25 ENCOUNTER — Inpatient Hospital Stay: Payer: Medicare Other | Attending: Oncology

## 2023-01-25 VITALS — BP 124/60 | HR 78 | Temp 97.7°F | Resp 18 | Ht 63.0 in | Wt 141.0 lb

## 2023-01-25 DIAGNOSIS — D631 Anemia in chronic kidney disease: Secondary | ICD-10-CM | POA: Diagnosis not present

## 2023-01-25 DIAGNOSIS — N183 Chronic kidney disease, stage 3 unspecified: Secondary | ICD-10-CM

## 2023-01-25 DIAGNOSIS — D649 Anemia, unspecified: Secondary | ICD-10-CM | POA: Diagnosis not present

## 2023-01-25 DIAGNOSIS — N189 Chronic kidney disease, unspecified: Secondary | ICD-10-CM | POA: Diagnosis not present

## 2023-01-25 LAB — CBC AND DIFFERENTIAL
HCT: 29 — AB (ref 36–46)
Hemoglobin: 10 — AB (ref 12.0–16.0)
Neutrophils Absolute: 8.85
Platelets: 278 10*3/uL (ref 150–400)
WBC: 11.8

## 2023-01-25 LAB — CMP (CANCER CENTER ONLY)
ALT: 17 U/L (ref 0–44)
AST: 24 U/L (ref 15–41)
Albumin: 4.2 g/dL (ref 3.5–5.0)
Alkaline Phosphatase: 67 U/L (ref 38–126)
Anion gap: 11 (ref 5–15)
BUN: 30 mg/dL — ABNORMAL HIGH (ref 8–23)
CO2: 25 mmol/L (ref 22–32)
Calcium: 9.5 mg/dL (ref 8.9–10.3)
Chloride: 92 mmol/L — ABNORMAL LOW (ref 98–111)
Creatinine: 1.38 mg/dL — ABNORMAL HIGH (ref 0.44–1.00)
GFR, Estimated: 40 mL/min — ABNORMAL LOW (ref >60)
Glucose, Bld: 152 mg/dL — ABNORMAL HIGH (ref 70–99)
Potassium: 4.4 mmol/L (ref 3.5–5.1)
Sodium: 128 mmol/L — ABNORMAL LOW (ref 135–145)
Total Bilirubin: 0.4 mg/dL (ref 0.3–1.2)
Total Protein: 8.3 g/dL — ABNORMAL HIGH (ref 6.5–8.1)

## 2023-01-25 LAB — CBC: RBC: 3.24 — AB (ref 3.87–5.11)

## 2023-01-25 MED ORDER — EPOETIN ALFA-EPBX 20000 UNIT/ML IJ SOLN
20000.0000 [IU] | Freq: Once | INTRAMUSCULAR | Status: AC
  Administered 2023-01-25: 20000 [IU] via SUBCUTANEOUS
  Filled 2023-01-25: qty 1

## 2023-01-25 NOTE — Patient Instructions (Signed)

## 2023-01-29 DIAGNOSIS — R918 Other nonspecific abnormal finding of lung field: Secondary | ICD-10-CM | POA: Diagnosis not present

## 2023-01-29 DIAGNOSIS — R059 Cough, unspecified: Secondary | ICD-10-CM | POA: Diagnosis not present

## 2023-02-07 DIAGNOSIS — Z1331 Encounter for screening for depression: Secondary | ICD-10-CM | POA: Diagnosis not present

## 2023-02-07 DIAGNOSIS — M8589 Other specified disorders of bone density and structure, multiple sites: Secondary | ICD-10-CM | POA: Diagnosis not present

## 2023-02-07 DIAGNOSIS — Z0000 Encounter for general adult medical examination without abnormal findings: Secondary | ICD-10-CM | POA: Diagnosis not present

## 2023-02-07 DIAGNOSIS — E785 Hyperlipidemia, unspecified: Secondary | ICD-10-CM | POA: Diagnosis not present

## 2023-02-07 DIAGNOSIS — Z6825 Body mass index (BMI) 25.0-25.9, adult: Secondary | ICD-10-CM | POA: Diagnosis not present

## 2023-02-07 DIAGNOSIS — Z79899 Other long term (current) drug therapy: Secondary | ICD-10-CM | POA: Diagnosis not present

## 2023-02-20 ENCOUNTER — Inpatient Hospital Stay: Payer: Medicare Other

## 2023-02-20 VITALS — BP 109/48 | HR 68 | Temp 98.0°F | Resp 18 | Ht 63.0 in | Wt 142.8 lb

## 2023-02-20 DIAGNOSIS — D631 Anemia in chronic kidney disease: Secondary | ICD-10-CM | POA: Diagnosis not present

## 2023-02-20 DIAGNOSIS — N183 Chronic kidney disease, stage 3 unspecified: Secondary | ICD-10-CM | POA: Diagnosis not present

## 2023-02-20 DIAGNOSIS — D649 Anemia, unspecified: Secondary | ICD-10-CM | POA: Diagnosis not present

## 2023-02-20 DIAGNOSIS — N189 Chronic kidney disease, unspecified: Secondary | ICD-10-CM | POA: Diagnosis not present

## 2023-02-20 LAB — CBC: RBC: 2.95 — AB (ref 3.87–5.11)

## 2023-02-20 LAB — CBC AND DIFFERENTIAL
HCT: 27 — AB (ref 36–46)
Hemoglobin: 9.1 — AB (ref 12.0–16.0)
Neutrophils Absolute: 8.73
Platelets: 178 10*3/uL (ref 150–400)
WBC: 11.8

## 2023-02-20 MED ORDER — EPOETIN ALFA-EPBX 20000 UNIT/ML IJ SOLN
20000.0000 [IU] | Freq: Once | INTRAMUSCULAR | Status: AC
  Administered 2023-02-20: 20000 [IU] via SUBCUTANEOUS
  Filled 2023-02-20: qty 1

## 2023-02-20 NOTE — Patient Instructions (Signed)

## 2023-03-19 ENCOUNTER — Encounter: Payer: Self-pay | Admitting: Oncology

## 2023-03-20 ENCOUNTER — Inpatient Hospital Stay: Payer: Medicare Other | Attending: Oncology

## 2023-03-20 ENCOUNTER — Inpatient Hospital Stay: Payer: Medicare Other

## 2023-03-20 VITALS — BP 122/63 | HR 71 | Temp 97.8°F | Resp 12 | Ht 63.0 in | Wt 144.0 lb

## 2023-03-20 DIAGNOSIS — N189 Chronic kidney disease, unspecified: Secondary | ICD-10-CM | POA: Diagnosis not present

## 2023-03-20 DIAGNOSIS — D631 Anemia in chronic kidney disease: Secondary | ICD-10-CM

## 2023-03-20 DIAGNOSIS — N183 Chronic kidney disease, stage 3 unspecified: Secondary | ICD-10-CM | POA: Diagnosis not present

## 2023-03-20 DIAGNOSIS — D649 Anemia, unspecified: Secondary | ICD-10-CM | POA: Diagnosis not present

## 2023-03-20 LAB — CBC AND DIFFERENTIAL
HCT: 28 — AB (ref 36–46)
Hemoglobin: 9.4 — AB (ref 12.0–16.0)
Neutrophils Absolute: 5.56
Platelets: 173 10*3/uL (ref 150–400)
WBC: 8.3

## 2023-03-20 LAB — CBC: RBC: 3.09 — AB (ref 3.87–5.11)

## 2023-03-20 MED ORDER — EPOETIN ALFA-EPBX 20000 UNIT/ML IJ SOLN
20000.0000 [IU] | Freq: Once | INTRAMUSCULAR | Status: AC
  Administered 2023-03-20: 20000 [IU] via SUBCUTANEOUS
  Filled 2023-03-20: qty 1

## 2023-03-22 DIAGNOSIS — F419 Anxiety disorder, unspecified: Secondary | ICD-10-CM | POA: Diagnosis not present

## 2023-03-22 DIAGNOSIS — Z6825 Body mass index (BMI) 25.0-25.9, adult: Secondary | ICD-10-CM | POA: Diagnosis not present

## 2023-03-25 ENCOUNTER — Other Ambulatory Visit: Payer: Self-pay | Admitting: Cardiology

## 2023-03-26 ENCOUNTER — Other Ambulatory Visit: Payer: Self-pay | Admitting: Cardiology

## 2023-03-26 NOTE — Telephone Encounter (Signed)
Refills to pharmacy 

## 2023-03-26 NOTE — Progress Notes (Signed)
[facility]  [address] [place],  [place]  [address] [phone]  Clinic Day:  03/27/2023  Referring physician: Ronita Hipps, MD   HISTORY OF PRESENT ILLNESS:  The patient is a 77 y.o. female with anemia secondary to chronic renal insufficiency.   She also has had issues with iron deficiency anemia to where IV iron was given in 2023.  She intermittently receives monthly Retacrit injections to get her hemoglobin to/above 10.  She comes in today to reassess her anemia.  Since her last visit, the patient has been doing well.  She denies having increased fatigue or any overt forms of blood loss which concern her for progressive anemia.  PHYSICAL EXAM:  Blood pressure (!) 149/70, pulse 68, temperature 98.6 F (37 C), resp. rate 14, height 5\' 3"  (1.6 m), weight 143 lb 11.2 oz (65.2 kg), SpO2 95 %. Wt Readings from Last 3 Encounters:  03/27/23 143 lb 11.2 oz (65.2 kg)  03/20/23 144 lb 0.6 oz (65.3 kg)  02/20/23 142 lb 12 oz (64.8 kg)   Body mass index is 25.46 kg/m. Performance status (ECOG): 1 - Symptomatic but completely ambulatory Physical Exam Constitutional:      Appearance: Normal appearance. She is not ill-appearing.  HENT:     Mouth/Throat:     Mouth: Mucous membranes are moist.     Pharynx: Oropharynx is clear. No oropharyngeal exudate or posterior oropharyngeal erythema.  Cardiovascular:     Rate and Rhythm: Normal rate and regular rhythm.     Heart sounds: No murmur heard.    No friction rub. No gallop.  Pulmonary:     Effort: Pulmonary effort is normal. No respiratory distress.     Breath sounds: Normal breath sounds. No wheezing, rhonchi or rales.  Abdominal:     General: Bowel sounds are normal. There is no distension.     Palpations: Abdomen is soft. There is no mass.     Tenderness: There is no abdominal tenderness.  Musculoskeletal:        General: No swelling.     Right lower leg: No edema.     Left lower leg: No edema.   Lymphadenopathy:     Cervical: No cervical adenopathy.     Upper Body:     Right upper body: No supraclavicular or axillary adenopathy.     Left upper body: No supraclavicular or axillary adenopathy.     Lower Body: No right inguinal adenopathy. No left inguinal adenopathy.  Skin:    General: Skin is warm.     Coloration: Skin is not jaundiced.     Findings: No lesion or rash.  Neurological:     General: No focal deficit present.     Mental Status: She is alert and oriented to person, place, and time. Mental status is at baseline.  Psychiatric:        Mood and Affect: Mood normal.        Behavior: Behavior normal.        Thought Content: Thought content normal.   LABS:     Latest Reference Range & Units 03/27/23 08:22  Iron 28 - 170 ug/dL 69  UIBC ug/dL 235  TIBC 250 - 450 ug/dL 304  Saturation Ratios 10.4 - 31.8 % 23  Ferritin 11 - 307 ng/mL 204   ASSESSMENT & PLAN:  Assessment/Plan:  A 77 y.o. female with anemia secondary to chronic renal insufficiency.  As her hemoglobin is less than 10 and her iron  parameters are normal, she will continue Retacrit shots at 20,000 units monthly.  Her CBC will continue to be followed monthly to ensure there is no precipitous decline in her hemoglobin.  I will see her back in 3 months for repeat clinical assessment.  The patient understands all the plans discussed today and is in agreement with them.    Samay Delcarlo Macarthur Critchley, MD

## 2023-03-26 NOTE — Telephone Encounter (Signed)
Refill to pharmacy 

## 2023-03-27 ENCOUNTER — Other Ambulatory Visit: Payer: Self-pay | Admitting: Oncology

## 2023-03-27 ENCOUNTER — Inpatient Hospital Stay: Payer: Medicare Other | Attending: Oncology

## 2023-03-27 ENCOUNTER — Inpatient Hospital Stay (INDEPENDENT_AMBULATORY_CARE_PROVIDER_SITE_OTHER): Payer: Medicare Other | Admitting: Oncology

## 2023-03-27 VITALS — BP 149/70 | HR 68 | Temp 98.6°F | Resp 14 | Ht 63.0 in | Wt 143.7 lb

## 2023-03-27 DIAGNOSIS — N183 Chronic kidney disease, stage 3 unspecified: Secondary | ICD-10-CM | POA: Insufficient documentation

## 2023-03-27 DIAGNOSIS — D649 Anemia, unspecified: Secondary | ICD-10-CM | POA: Diagnosis not present

## 2023-03-27 DIAGNOSIS — D631 Anemia in chronic kidney disease: Secondary | ICD-10-CM

## 2023-03-27 DIAGNOSIS — N189 Chronic kidney disease, unspecified: Secondary | ICD-10-CM

## 2023-03-27 LAB — CMP (CANCER CENTER ONLY)
ALT: 15 U/L (ref 0–44)
AST: 19 U/L (ref 15–41)
Albumin: 4.4 g/dL (ref 3.5–5.0)
Alkaline Phosphatase: 70 U/L (ref 38–126)
Anion gap: 8 (ref 5–15)
BUN: 26 mg/dL — ABNORMAL HIGH (ref 8–23)
CO2: 25 mmol/L (ref 22–32)
Calcium: 9.5 mg/dL (ref 8.9–10.3)
Chloride: 95 mmol/L — ABNORMAL LOW (ref 98–111)
Creatinine: 1.21 mg/dL — ABNORMAL HIGH (ref 0.44–1.00)
GFR, Estimated: 47 mL/min — ABNORMAL LOW (ref >60)
Glucose, Bld: 109 mg/dL — ABNORMAL HIGH (ref 70–99)
Potassium: 4.6 mmol/L (ref 3.5–5.1)
Sodium: 128 mmol/L — ABNORMAL LOW (ref 135–145)
Total Bilirubin: 0.4 mg/dL (ref 0.3–1.2)
Total Protein: 7.9 g/dL (ref 6.5–8.1)

## 2023-03-27 LAB — IRON AND TIBC
Iron: 69 ug/dL (ref 28–170)
Saturation Ratios: 23 % (ref 10.4–31.8)
TIBC: 304 ug/dL (ref 250–450)
UIBC: 235 ug/dL

## 2023-03-27 LAB — CBC AND DIFFERENTIAL
HCT: 28 — AB (ref 36–46)
Hemoglobin: 9.6 — AB (ref 12.0–16.0)
Neutrophils Absolute: 4.93
Platelets: 237 10*3/uL (ref 150–400)
WBC: 7.7

## 2023-03-27 LAB — CBC: RBC: 3.12 — AB (ref 3.87–5.11)

## 2023-03-27 LAB — FERRITIN: Ferritin: 204 ng/mL (ref 11–307)

## 2023-04-05 DIAGNOSIS — F419 Anxiety disorder, unspecified: Secondary | ICD-10-CM | POA: Insufficient documentation

## 2023-04-05 DIAGNOSIS — K589 Irritable bowel syndrome without diarrhea: Secondary | ICD-10-CM | POA: Insufficient documentation

## 2023-04-05 DIAGNOSIS — Z860100 Personal history of colon polyps, unspecified: Secondary | ICD-10-CM | POA: Insufficient documentation

## 2023-04-05 DIAGNOSIS — K219 Gastro-esophageal reflux disease without esophagitis: Secondary | ICD-10-CM | POA: Insufficient documentation

## 2023-04-05 DIAGNOSIS — F32A Depression, unspecified: Secondary | ICD-10-CM | POA: Insufficient documentation

## 2023-04-09 NOTE — Progress Notes (Signed)
 " Cardiology Office Note:    Date:  04/10/2023   ID:  Angela Mccoy, DOB 1946/03/31, MRN [id number]  PCP:  Ina Marcellus RAMAN, MD   [facility] Providers Cardiologist:  Redell Leiter, MD     Referring MD: Ina Marcellus RAMAN, MD   No chief complaint on file.   History of Present Illness:    Angela Mccoy is a 77 y.o. female with a hx of CAD s/p PCI and DES to LAD and RCA in 2003, hypertension, mitral valve prolapse, PVD, persistent atrial fibrillation, GERD, IBS, rheumatoid arthritis, HLD, anemia (followed at [facility] cancer center by Dr. Ezzard), CKD stage III.  ZIO monitor in November 2020 to assess for palpitation burden, rare ventricular ectopy with isolated PVCs, no A-fib or flutter, 11 triggered events that were associated with rare atrial premature contractions.  Symptoms were felt to be related to her anemia.   Renal ultrasound on 02/19/2020 without evidence of stenosis. MPI on 05/26/2021, low risk study.  She was evaluated by Dr. Leiter on 10/10/2021, reporting frequent angina and requiring multiple doses of nitroglycerin  the previous month, LHC was planned and she was admitted on 10/18/2021 left heart cath revealed two-vessel CAD, widely patent LAD stent with 10 to 15% ISR; large dominant RCA with proximal stent that has distal edge leading to a native vessel with 85% irregular stenosis s/p DES PCI with overlapping DES stent.  Most recently she was evaluated Dr. Leiter on 10/24/2022, at that time she was doing well from a cardiac perspective with no recurrent angina.  Plavix  was continued for monotherapy.  She presents today for follow-up of her CAD.  Over the last few months she has noticed that she has some fatigue, and DOE, especially when she is walking.  She denies chest pain or shortness of breath.  She checks her blood pressure at home and it is typically well-controlled.  She denies chest pain, palpitations, dyspnea, pnd, orthopnea, n, v, dizziness,  syncope, edema, weight gain, or early satiety.     Past Medical History:  Diagnosis Date   Anemia 07/31/2017   Anemia in chronic kidney disease 10/03/2020   Anxiety and depression    Carotid bruit 02/26/2016   Carotid bruit present 02/26/2016   Cavitary pneumonia 03/20/2019   Onset of symptoms around late  Feb 2020 > all symptoms resolved p 5 days Meropenem and rx x 2 weeks then changed to cleocin -  D/c cleocin 03/20/2019  With esr 12, wbc 7,800  -  Quant TB 03/20/2019  Neg  - 05/14/2019 cxr with minimal streaky residual, no as dz    Coronary artery disease involving native coronary artery 02/26/2016   Coronary artery disease involving native coronary artery of native heart with angina pectoris 03/12/2016   Overview:  PCI and DES to LAD and RCA in 2003, cath 2010 wo restenosis   Essential hypertension 03/12/2016   GERD (gastroesophageal reflux disease)    High cholesterol 01/17/2017   History of colon polyps    Hx of diverticulitis of colon 09/11/2016   Hyperlipidemia 03/12/2016   Hypertension 01/27/2016   IBS (irritable bowel syndrome)    Insomnia 02/26/2016   Migraine with aura 02/26/2016   Mitral valve prolapse 02/26/2016   Peripheral vascular disease 02/26/2016   Persistent atrial fibrillation 11/06/2019   Pneumonia of right upper lobe due to infectious organism 02/26/2019   Pre-op evaluation 11/19/2019   Rheumatoid arthritis involving multiple joints 02/26/2016   Rheumatoid arthritis involving multiple sites 02/26/2016  Past Surgical History:  Procedure Laterality Date   ABDOMINAL HYSTERECTOMY     CHOLECYSTECTOMY     COLONOSCOPY  10/21/2014   Moderate predominantly sigmod diverticulosis. Small internal hemorrhoids with evidence of previous anorectal surgery (no active bleeding)   CORONARY STENT INTERVENTION N/A 10/18/2021   Procedure: CORONARY STENT INTERVENTION;  Surgeon: Anner Alm ORN, MD;  Location: [facility] INVASIVE CV LAB;  Service: Cardiovascular;  Laterality: N/A;    ESOPHAGOGASTRODUODENOSCOPY  09/25/2012   Normal EGD   heart stents     x 4   LEFT HEART CATH AND CORONARY ANGIOGRAPHY N/A 10/18/2021   Procedure: LEFT HEART CATH AND CORONARY ANGIOGRAPHY;  Surgeon: Anner Alm ORN, MD;  Location: [facility] INVASIVE CV LAB;  Service: Cardiovascular;  Laterality: N/A;   rectal fissure      Current Medications: Current Meds  Medication Sig   allopurinol (ZYLOPRIM) 100 MG tablet Take 100 mg by mouth daily.   carvedilol  (COREG ) 6.25 MG tablet TAKE 1 TABLET(6.25 MG) BY MOUTH TWICE DAILY WITH A MEAL   chlorthalidone (HYGROTON) 25 MG tablet Take 25 mg by mouth daily.   citalopram (CELEXA) 10 MG tablet Take 10 mg by mouth daily.   clopidogrel  (PLAVIX ) 75 MG tablet TAKE 1 TABLET(75 MG) BY MOUTH DAILY   isosorbide  mononitrate (IMDUR ) 30 MG 24 hr tablet Take 1 tablet (30 mg total) by mouth daily.   Multiple Vitamins-Minerals (MULTIVITAMIN WITH MINERALS) tablet Take 1 tablet by mouth daily.   nitroGLYCERIN  (NITROSTAT ) 0.4 MG SL tablet ONE TABLET UNDER TONGUE AS NEEDED FOR CHEST PAIN EVERY 5 MINUTES   pantoprazole  (PROTONIX ) 40 MG tablet Take 40 mg by mouth 2 (two) times daily.   potassium chloride  SA (KLOR-CON ) 20 MEQ tablet Take 20 mEq by mouth 2 (two) times daily.   pravastatin  (PRAVACHOL ) 40 MG tablet Take 40 mg by mouth daily.   QUEtiapine (SEROQUEL) 300 MG tablet Take 300 mg by mouth at bedtime.   traMADol (ULTRAM) 50 MG tablet Take 50 mg by mouth 3 (three) times daily.   valsartan  (DIOVAN ) 80 MG tablet TAKE 1 TABLET(80 MG) BY MOUTH DAILY     Allergies:   Penicillins   Social History   Socioeconomic History   Marital status: Married    Spouse name: Not on file   Number of children: 2   Years of education: Not on file   Highest education level: Not on file  Occupational History   Occupation: [employer]  Tobacco Use   Smoking status: Former    Packs/day: 1.00    Years: 18.00    Additional pack years: 0.00    Total pack years: 18.00    Types:  Cigarettes    Quit date: 12/26/1987    Years since quitting: 35.3   Smokeless tobacco: Never  Vaping Use   Vaping Use: Never used  Substance and Sexual Activity   Alcohol use: No   Drug use: Never   Sexual activity: Not on file  Other Topics Concern   Not on file  Social History Narrative   Not on file   Social Determinants of Health   Financial Resource Strain: Not on file  Food Insecurity: Not on file  Transportation Needs: Not on file  Physical Activity: Not on file  Stress: Not on file  Social Connections: Not on file     Family History: The patient's family history includes Alzheimer's disease in her father; Breast cancer in her sister; Heart attack in her brother, mother, and sister; Lung cancer in her  brother; Stroke in her maternal grandmother. There is no history of Asthma, Colon cancer, Rectal cancer, Stomach cancer, Esophageal cancer, Pancreatic cancer, or Liver cancer.  ROS:   Please see the history of present illness.    All other systems reviewed and are negative.  EKGs/Labs/Other Studies Reviewed:    The following studies were reviewed today:  Cardiac Studies & Procedures   CARDIAC CATHETERIZATION  CARDIAC CATHETERIZATION 10/18/2021  Narrative   Ost RCA to Prox RCA stent is 10% in-stent restenosis.   CULPRIT LESION: Prox RCA to Mid RCA lesion is 85% stenosed just beyond the stent.   After scoring balloon angioplasty, A drug-eluting stent was successfully placed (overlapping previous stent) using a SYNERGY XD 2.75X28.  Postdilated to 3.1 mm   Post intervention, there is a 5% residual stenosis at the most significant segment, however the remainder has 0% visual stenosis.SABRA   ------------------------------   Mid LAD to Dist LAD previously placed stent is 10% stenosed (actually focally at small D2).   ------------------------------   The left ventricular systolic function is normal.  The left ventricular ejection fraction is 55-65% by visual estimate.   There  is no aortic valve stenosis.  SUMMARY Two-vessel CAD: Widely patent LAD stent with maybe 10 to 15% ISR, otherwise normal LCA system with small nondominant LCx Large dominant RCA with proximal stent that has distal edge leading into a native vessel 85% irregular stenosis.  (CULPRIT LESION) Successful Scoring Balloon Angioplasty followed by DES PCI with an overlapping DES stent (2.75 mm x 28 mm postdilated to 3.1 mm) Normal LVEF and EDP.   RECOMMENDATIONS Stable for Same-Day PCI. Is already on Plavix  -> uninterrupted DAPT now for at least 6 months. Continue aggressive risk factor modification.  Follow-up with Dr. Monetta.    Alm Clay, MD  Findings Coronary Findings Diagnostic  Dominance: Right  Left Anterior Descending Mid LAD to Dist LAD lesion is 10% stenosed. The lesion is focal. The lesion was previously treated using a stent (unknown type) over 2 years ago. Previously placed stent displays restenosis. The stenosis was measured by a visual reading.  Second Diagonal Branch Vessel is small in size.  Third Diagonal Branch Vessel is small in size.  Left Circumflex Essentially courses almost to the ramus intermedius/high OM  Left Posterior Atrioventricular Artery Vessel is small in size.  Right Coronary Artery Vessel was injected. Vessel is normal in caliber. Ost RCA to Prox RCA lesion is 10% stenosed. The lesion was previously treated using a stent (unknown type) over 2 years ago. Previously placed stent displays restenosis. Prox RCA to Mid RCA lesion is 85% stenosed. The lesion is located proximal to the major branch, segmental and irregular. Tapers from 85% up to about 50%.  Acute Marginal Branch Vessel is small in size.  Right Ventricular Branch Vessel is small in size.  Right Posterior Atrioventricular Artery Vessel is large in size.  First Right Posterolateral Branch Vessel is small in size.  Second Right Posterolateral Branch Vessel is moderate in  size.  Intervention  Prox RCA to Mid RCA lesion Stent Lesion length:  24 mm. CATH VISTA GUIDE 6FR JR4 guide catheter was inserted. Lesion crossed with guidewire using a WIRE ASAHI PROWATER 180CM. Pre-stent angioplasty was performed using a BALLN SCOREFLEX 2.50X15. Maximum pressure:  14 atm. Inflation time: 30 sec. BALLN SAPPHIRE 2.0X15 -&gt; 12 ATM, 20 sec A drug-eluting stent was successfully placed using a SYNERGY XD 2.75X28. Maximum pressure: 18 atm. Inflation time: 30 sec. Stent strut is well apposed.  At the original 85% lesion site there are 2 focal areas where there is maybe 10% residual stenosis despite aggressive post dilation. Stent overlaps previously placed stent. Post-stent angioplasty was performed using a BALLN [place] EMERGE MR 3.0X20. Maximum pressure:  20 atm. Inflation time:  20 sec. Post-Intervention Lesion Assessment The intervention was successful. Pre-interventional TIMI flow is 3. Post-intervention TIMI flow is 3. Treated lesion length:  26 mm. No complications occurred at this lesion. There is a 5% residual stenosis post intervention.   STRESS TESTS  MYOCARDIAL PERFUSION IMAGING 05/26/2021  Narrative  The left ventricular ejection fraction is hyperdynamic (>65%).  Nuclear stress EF: 67%.  There was no ST segment deviation noted during stress.  No T wave inversion was noted during stress.  This is a low risk study.  The study is normal.     MONITORS  LONG TERM MONITOR (3-14 DAYS) 11/24/2019  Narrative ZIO monitor was used for 3 days to assess palpitation in the setting of coronary artery disease.  The study initiated 11/06/2019.  The rhythm throughout was sinus with minimum, average and maximum heart rates of 46, 60 and 99 bpm.  There were no pauses of 3 seconds or greater or episodes of AV nodal or sinus node block.  Ventricular ectopy was rare with isolated PVCs  Supraventricular ectopy was rare with isolated APCs.  There was one 6 beat run of ectopic  atrial rhythm noted asymptomatic.  There were no episodes of atrial fibrillation or flutter.  There are 11 triggered events 1 associated with rare atrial premature contraction.  There were 8 diary events of chest pain palpitation shortness of breath and lightheadedness 1 associated with rare atrial premature contraction   Conclusion unremarkable 3-day ZIO monitor triggered and diary events are generally unassociated with arrhythmia.            EKG:  EKG is not ordered today.   Recent Labs: 03/27/2023: ALT 15; BUN 26; Creatinine 1.21; Hemoglobin 9.6; Platelets 237; Potassium 4.6; Sodium 128  Recent Lipid Panel    Component Value Date/Time   CHOL 145 10/10/2021 0938   TRIG 248 (H) 10/10/2021 0938   HDL 42 10/10/2021 0938   CHOLHDL 3.5 10/10/2021 0938   LDLCALC 63 10/10/2021 0938     Risk Assessment/Calculations:                Physical Exam:    VS:  BP 110/60 (BP Location: Right Arm, Patient Position: Sitting, Cuff Size: Normal)   Pulse 62   Ht 5' 3 (1.6 m)   Wt 147 lb (66.7 kg)   SpO2 94%   BMI 26.04 kg/m     Wt Readings from Last 3 Encounters:  04/10/23 147 lb (66.7 kg)  03/27/23 143 lb 11.2 oz (65.2 kg)  03/20/23 144 lb 0.6 oz (65.3 kg)     GEN:  Well nourished, well developed in no acute distress HEENT: Normal NECK: No JVD; No carotid bruits LYMPHATICS: No lymphadenopathy CARDIAC: RRR, + murmur 2/6 LSB, rubs, gallops RESPIRATORY:  Clear to auscultation without rales, wheezing or rhonchi  ABDOMEN: Soft, non-tender, non-distended MUSCULOSKELETAL:  No edema; No deformity  SKIN: Warm and dry NEUROLOGIC:  Alert and oriented x 3 PSYCHIATRIC:  Normal affect   ASSESSMENT:    1. Coronary artery disease involving native coronary artery of native heart with angina pectoris   2. Essential hypertension   3. Hyperlipidemia, unspecified hyperlipidemia type   4. CKD (chronic kidney disease) stage 4, GFR 15-29 ml/min   5.  Iron  deficiency anemia, unspecified iron   deficiency anemia type   6. Mild tricuspid regurgitation by prior echocardiogram   7. Other fatigue   8. DOE (dyspnea on exertion)    PLAN:    In order of problems listed above:  CAD - s/p PCI and DES to LAD and RCA in 2003; most recent LHC in 2022 revealed two-vessel CAD s/p DES PCI with an overlapping DES stent to RCA. Stable with no anginal symptoms. No indication for ischemic evaluation.  Continue carvedilol  6.25 mg twice a day, Plavix  75 mg daily, Imdur  30 mg daily, pravastatin  40 mg daily, nitroglycerin  as needed--has not needed.  Hypertension -blood pressure today is well-controlled at 110/60, continue carvedilol  6.25 mg twice a day, valsartan  80 mg once a day.  Fatigue/DOE -likely multifactorial related to her chronic anemia, symptoms are not consistent with prior CAD symptoms. Will repeat echo per below, BNP, TSH, BMET.  CBC is followed by oncology for anemia.  Mild TR/murmur -most recent echo in 2020 revealed trivial MR, will repeat echo.  HLD -LDL on 10/10/2021 was 63, continue pravastatin  40 mg daily.  CKD with anemia -follows with the cancer for infusions, goal is hemoglobin greater than 10, recent CBC on 4-24 Hgb 9.6, HCT 28.     Disposition-repeat echocardiogram.  TSH, magnesium , BMET, BNP.  Return in 3 months.      Medication Adjustments/Labs and Tests Ordered: Current medicines are reviewed at length with the patient today.  Concerns regarding medicines are outlined above.  Orders Placed This Encounter  Procedures   Basic Metabolic Panel (BMET)   Magnesium    TSH   Pro b natriuretic peptide (BNP)   ECHOCARDIOGRAM COMPLETE   No orders of the defined types were placed in this encounter.   Patient Instructions  Medication Instructions:  Your physician recommends that you continue on your current medications as directed. Please refer to the Current Medication list given to you today.  *If you need a refill on your cardiac medications before your next  appointment, please call your pharmacy*   Lab Work: Your physician recommends that you return for lab work in:   Labs today: BMP, Mg, TSH, Pro BNP  If you have labs (blood work) drawn today and your tests are completely normal, you will receive your results only by: MyChart Message (if you have MyChart) OR A paper copy in the mail If you have any lab test that is abnormal or we need to change your treatment, we will call you to review the results.   Testing/Procedures: Your physician has requested that you have an echocardiogram. Echocardiography is a painless test that uses sound waves to create images of your heart. It provides your doctor with information about the size and shape of your heart and how well your hearts chambers and valves are working. This procedure takes approximately one hour. There are no restrictions for this procedure. Please do NOT wear cologne, perfume, aftershave, or lotions (deodorant is allowed). Please arrive 15 minutes prior to your appointment time.    Follow-Up: At [facility], you and your health needs are our priority.  As part of our continuing mission to provide you with exceptional heart care, we have created designated Provider Care Teams.  These Care Teams include your primary Cardiologist (physician) and Advanced Practice Providers (APPs -  Physician Assistants and Nurse Practitioners) who all work together to provide you with the care you need, when you need it.  We recommend signing up for the patient portal  called MyChart.  Sign up information is provided on this After Visit Summary.  MyChart is used to connect with patients for Virtual Visits (Telemedicine).  Patients are able to view lab/test results, encounter notes, upcoming appointments, etc.  Non-urgent messages can be sent to your provider as well.   To learn more about what you can do with MyChart, go to [email or web address].    Your next appointment:   3  month(s)  Provider:   Delon Hoover, NP [facility])    Other Instructions None    Signed, Delon JAYSON Hoover, NP  04/10/2023 2:07 PM    [facility] "

## 2023-04-10 ENCOUNTER — Ambulatory Visit: Payer: Medicare Other | Admitting: Cardiology

## 2023-04-10 ENCOUNTER — Ambulatory Visit: Payer: Medicare Other | Attending: Cardiology | Admitting: Cardiology

## 2023-04-10 ENCOUNTER — Encounter: Payer: Self-pay | Admitting: Cardiology

## 2023-04-10 VITALS — BP 110/60 | HR 62 | Ht 63.0 in | Wt 147.0 lb

## 2023-04-10 DIAGNOSIS — R0609 Other forms of dyspnea: Secondary | ICD-10-CM

## 2023-04-10 DIAGNOSIS — N184 Chronic kidney disease, stage 4 (severe): Secondary | ICD-10-CM

## 2023-04-10 DIAGNOSIS — I071 Rheumatic tricuspid insufficiency: Secondary | ICD-10-CM

## 2023-04-10 DIAGNOSIS — D509 Iron deficiency anemia, unspecified: Secondary | ICD-10-CM

## 2023-04-10 DIAGNOSIS — I25119 Atherosclerotic heart disease of native coronary artery with unspecified angina pectoris: Secondary | ICD-10-CM

## 2023-04-10 DIAGNOSIS — R5383 Other fatigue: Secondary | ICD-10-CM | POA: Diagnosis not present

## 2023-04-10 DIAGNOSIS — I10 Essential (primary) hypertension: Secondary | ICD-10-CM

## 2023-04-10 DIAGNOSIS — E785 Hyperlipidemia, unspecified: Secondary | ICD-10-CM

## 2023-04-10 NOTE — Patient Instructions (Signed)
Medication Instructions:  Your physician recommends that you continue on your current medications as directed. Please refer to the Current Medication list given to you today.  *If you need a refill on your cardiac medications before your next appointment, please call your pharmacy*   Lab Work: Your physician recommends that you return for lab work in:   Labs today: BMP, Mg, TSH, Pro BNP  If you have labs (blood work) drawn today and your tests are completely normal, you will receive your results only by: MyChart Message (if you have MyChart) OR A paper copy in the mail If you have any lab test that is abnormal or we need to change your treatment, we will call you to review the results.   Testing/Procedures: Your physician has requested that you have an echocardiogram. Echocardiography is a painless test that uses sound waves to create images of your heart. It provides your doctor with information about the size and shape of your heart and how well your heart's chambers and valves are working. This procedure takes approximately one hour. There are no restrictions for this procedure. Please do NOT wear cologne, perfume, aftershave, or lotions (deodorant is allowed). Please arrive 15 minutes prior to your appointment time.    Follow-Up: At [facility], you and your health needs are our priority.  As part of our continuing mission to provide you with exceptional heart care, we have created designated Provider Care Teams.  These Care Teams include your primary Cardiologist (physician) and Advanced Practice Providers (APPs -  Physician Assistants and Nurse Practitioners) who all work together to provide you with the care you need, when you need it.  We recommend signing up for the patient portal called "MyChart".  Sign up information is provided on this After Visit Summary.  MyChart is used to connect with patients for Virtual Visits (Telemedicine).  Patients are able to view lab/test  results, encounter notes, upcoming appointments, etc.  Non-urgent messages can be sent to your provider as well.   To learn more about what you can do with MyChart, go to [email or web address].    Your next appointment:   3 month(s)  Provider:   Wallis Bamberg, NP [facility])    Other Instructions None

## 2023-04-17 ENCOUNTER — Telehealth: Payer: Self-pay | Admitting: Cardiology

## 2023-04-17 ENCOUNTER — Inpatient Hospital Stay: Payer: Medicare Other

## 2023-04-17 ENCOUNTER — Encounter: Payer: Self-pay | Admitting: Oncology

## 2023-04-17 VITALS — BP 135/61 | HR 68 | Temp 97.8°F | Resp 18 | Ht 63.0 in | Wt 142.5 lb

## 2023-04-17 DIAGNOSIS — D631 Anemia in chronic kidney disease: Secondary | ICD-10-CM

## 2023-04-17 DIAGNOSIS — D649 Anemia, unspecified: Secondary | ICD-10-CM | POA: Diagnosis not present

## 2023-04-17 DIAGNOSIS — N183 Chronic kidney disease, stage 3 unspecified: Secondary | ICD-10-CM | POA: Diagnosis not present

## 2023-04-17 LAB — BASIC METABOLIC PANEL WITH GFR
BUN/Creatinine Ratio: 22 (ref 12–28)
BUN: 28 mg/dL — ABNORMAL HIGH (ref 8–27)
CO2: 22 mmol/L (ref 20–29)
Calcium: 9.4 mg/dL (ref 8.7–10.3)
Chloride: 95 mmol/L — ABNORMAL LOW (ref 96–106)
Creatinine, Ser: 1.26 mg/dL — ABNORMAL HIGH (ref 0.57–1.00)
Glucose: 144 mg/dL — ABNORMAL HIGH (ref 70–99)
Potassium: 4.5 mmol/L (ref 3.5–5.2)
Sodium: 134 mmol/L (ref 134–144)
eGFR: 45 mL/min/1.73 — ABNORMAL LOW

## 2023-04-17 LAB — CBC: RBC: 3.09 — AB (ref 3.87–5.11)

## 2023-04-17 LAB — CBC AND DIFFERENTIAL
HCT: 28 — AB (ref 36–46)
Hemoglobin: 9.7 — AB (ref 12.0–16.0)
Neutrophils Absolute: 4.54
Platelets: 193 10*3/uL (ref 150–400)
WBC: 7.1

## 2023-04-17 LAB — PRO B NATRIURETIC PEPTIDE: NT-Pro BNP: 498 pg/mL (ref 0–738)

## 2023-04-17 LAB — MAGNESIUM: Magnesium: 1.4 mg/dL — ABNORMAL LOW (ref 1.6–2.3)

## 2023-04-17 LAB — TSH: TSH: 4.41 u[IU]/mL (ref 0.450–4.500)

## 2023-04-17 MED ORDER — EPOETIN ALFA-EPBX 20000 UNIT/ML IJ SOLN
20000.0000 [IU] | Freq: Once | INTRAMUSCULAR | Status: AC
  Administered 2023-04-17: 20000 [IU] via SUBCUTANEOUS
  Filled 2023-04-17: qty 1

## 2023-04-17 MED ORDER — MAGNESIUM OXIDE -MG SUPPLEMENT 200 MG PO TABS: 200.0000 mg | ORAL_TABLET | Freq: Every day | ORAL | 0 refills | Status: AC

## 2023-04-17 NOTE — Telephone Encounter (Signed)
Results reviewed with pt as per [facility] note.  Pt verbalized understanding and had no additional questions. Routed to PCP.

## 2023-04-17 NOTE — Telephone Encounter (Signed)
Pt returning call for lab results  

## 2023-04-17 NOTE — Telephone Encounter (Signed)
-----   Message from [name], NP sent at 04/17/2023 10:22 AM EDT ----- Ms. Ballester, Your chemistry panel was stable, showed stable kidney dysfunction. Nothing to do differently about this. Thyroid was normal.   Your magnesium was slightly low, I would like for you to take magnesium oxide 200 mg once a day, return in two weeks and we can repeat your magnesium level. We will send this in for you to the pharmacy.   Best, Victorino Dike

## 2023-04-17 NOTE — Patient Instructions (Signed)

## 2023-04-24 ENCOUNTER — Ambulatory Visit: Payer: Medicare Other

## 2023-05-08 ENCOUNTER — Ambulatory Visit: Payer: Medicare Other | Attending: Cardiology

## 2023-05-08 DIAGNOSIS — I10 Essential (primary) hypertension: Secondary | ICD-10-CM | POA: Insufficient documentation

## 2023-05-08 DIAGNOSIS — I071 Rheumatic tricuspid insufficiency: Secondary | ICD-10-CM | POA: Diagnosis not present

## 2023-05-08 DIAGNOSIS — R5383 Other fatigue: Secondary | ICD-10-CM | POA: Diagnosis not present

## 2023-05-08 DIAGNOSIS — R0609 Other forms of dyspnea: Secondary | ICD-10-CM | POA: Diagnosis not present

## 2023-05-08 DIAGNOSIS — E785 Hyperlipidemia, unspecified: Secondary | ICD-10-CM | POA: Diagnosis not present

## 2023-05-08 DIAGNOSIS — I25119 Atherosclerotic heart disease of native coronary artery with unspecified angina pectoris: Secondary | ICD-10-CM | POA: Insufficient documentation

## 2023-05-08 DIAGNOSIS — N184 Chronic kidney disease, stage 4 (severe): Secondary | ICD-10-CM | POA: Diagnosis not present

## 2023-05-08 DIAGNOSIS — D509 Iron deficiency anemia, unspecified: Secondary | ICD-10-CM | POA: Diagnosis not present

## 2023-05-09 ENCOUNTER — Telehealth: Payer: Self-pay

## 2023-05-09 LAB — ECHOCARDIOGRAM COMPLETE
Area-P 1/2: 3.96 cm2
S' Lateral: 2.6 cm

## 2023-05-09 LAB — MAGNESIUM: Magnesium: 1.4 mg/dL — ABNORMAL LOW (ref 1.6–2.3)

## 2023-05-09 NOTE — Telephone Encounter (Signed)
-----   Message from [name], NP sent at 05/09/2023  8:35 AM EDT ----- Magnesium is still low, increase your magnesium supplement to 200 mg twice daily.  Grains, nuts, spinach, bananas are all good dietary sources of magnesium. Return in 1 month for repeat magnesium level.

## 2023-05-10 ENCOUNTER — Telehealth: Payer: Self-pay | Admitting: *Deleted

## 2023-05-10 NOTE — Telephone Encounter (Signed)
-----   Message from [name], NP sent at 05/09/2023  3:28 PM EDT ----- Heart is squeezing good. Slightly stiff when it relaxes -- same as it was at last echo 4 years ago. Mild to moderate tricuspid valve leaking, this could cause some fatigue, but nothing to do other than monitor right now. We will repeat the echo in 1 year.  Stable results.

## 2023-05-10 NOTE — Telephone Encounter (Signed)
Informed pt of echo results, pt verbalized understanding. No further questions at this time.

## 2023-05-11 DIAGNOSIS — J40 Bronchitis, not specified as acute or chronic: Secondary | ICD-10-CM | POA: Diagnosis not present

## 2023-05-14 ENCOUNTER — Encounter: Payer: Self-pay | Admitting: Oncology

## 2023-05-15 ENCOUNTER — Inpatient Hospital Stay: Payer: Medicare Other | Attending: Oncology

## 2023-05-15 ENCOUNTER — Inpatient Hospital Stay: Payer: Medicare Other

## 2023-05-15 VITALS — BP 165/70 | HR 57 | Temp 97.5°F | Resp 18 | Ht 63.0 in | Wt 146.0 lb

## 2023-05-15 DIAGNOSIS — N183 Chronic kidney disease, stage 3 unspecified: Secondary | ICD-10-CM | POA: Diagnosis not present

## 2023-05-15 DIAGNOSIS — D631 Anemia in chronic kidney disease: Secondary | ICD-10-CM | POA: Insufficient documentation

## 2023-05-15 DIAGNOSIS — D649 Anemia, unspecified: Secondary | ICD-10-CM | POA: Diagnosis not present

## 2023-05-15 DIAGNOSIS — N189 Chronic kidney disease, unspecified: Secondary | ICD-10-CM | POA: Diagnosis not present

## 2023-05-15 LAB — CBC AND DIFFERENTIAL
HCT: 29 — AB (ref 36–46)
Hemoglobin: 9.8 — AB (ref 12.0–16.0)
Neutrophils Absolute: 8.73
Platelets: 231 10*3/uL (ref 150–400)
WBC: 12.3

## 2023-05-15 LAB — CBC: RBC: 3.14 — AB (ref 3.87–5.11)

## 2023-05-15 MED ORDER — EPOETIN ALFA-EPBX 20000 UNIT/ML IJ SOLN
20000.0000 [IU] | Freq: Once | INTRAMUSCULAR | Status: AC
  Administered 2023-05-15: 20000 [IU] via SUBCUTANEOUS
  Filled 2023-05-15: qty 1

## 2023-05-24 ENCOUNTER — Other Ambulatory Visit: Payer: Self-pay | Admitting: Cardiology

## 2023-05-24 DIAGNOSIS — I10 Essential (primary) hypertension: Secondary | ICD-10-CM

## 2023-06-11 ENCOUNTER — Encounter: Payer: Self-pay | Admitting: Oncology

## 2023-06-12 ENCOUNTER — Inpatient Hospital Stay: Payer: Medicare Other | Attending: Oncology

## 2023-06-12 ENCOUNTER — Inpatient Hospital Stay: Payer: Medicare Other

## 2023-06-12 VITALS — BP 134/55 | HR 67 | Temp 98.4°F | Resp 16 | Ht 63.0 in | Wt 145.0 lb

## 2023-06-12 DIAGNOSIS — D631 Anemia in chronic kidney disease: Secondary | ICD-10-CM | POA: Diagnosis not present

## 2023-06-12 DIAGNOSIS — N183 Chronic kidney disease, stage 3 unspecified: Secondary | ICD-10-CM | POA: Insufficient documentation

## 2023-06-12 LAB — CBC AND DIFFERENTIAL
HCT: 25 — AB (ref 36–46)
Hemoglobin: 8.7 — AB (ref 12.0–16.0)
Neutrophils Absolute: 3.72
Platelets: 214 10*3/uL (ref 150–400)
WBC: 6.2

## 2023-06-12 LAB — CBC: RBC: 2.77 — AB (ref 3.87–5.11)

## 2023-06-12 MED ORDER — EPOETIN ALFA-EPBX 20000 UNIT/ML IJ SOLN
20000.0000 [IU] | Freq: Once | INTRAMUSCULAR | Status: AC
  Administered 2023-06-12: 20000 [IU] via SUBCUTANEOUS
  Filled 2023-06-12: qty 1

## 2023-07-02 NOTE — Progress Notes (Unsigned)
[facility]  [address] [place],  [place]  [address] [phone]  Clinic Day:  07/03/2023  Referring physician: Marylen Ponto, MD   HISTORY OF PRESENT ILLNESS:  The patient is a 77 y.o. female with anemia secondary to chronic renal insufficiency.   She also has had issues with iron deficiency anemia to where IV iron was given in 2023.  She receives monthly Retacrit injections to get her hemoglobin to/above 10.  She comes in today to reassess her anemia.  Since her last visit, the patient has been doing fairly well.  She has had episodes of fatigue, but denies having any overt forms of blood loss which concern her for progressive anemia.  PHYSICAL EXAM:  Blood pressure 135/63, pulse 74, temperature 98.7 F (37.1 C), resp. rate 16, height 5\' 3"  (1.6 m), weight 143 lb 14.4 oz (65.3 kg), SpO2 91 %. Wt Readings from Last 3 Encounters:  07/03/23 143 lb 14.4 oz (65.3 kg)  06/12/23 145 lb 0.6 oz (65.8 kg)  05/15/23 146 lb (66.2 kg)   Body mass index is 25.49 kg/m. Performance status (ECOG): 1 - Symptomatic but completely ambulatory Physical Exam Constitutional:      Appearance: Normal appearance. She is not ill-appearing.  HENT:     Mouth/Throat:     Mouth: Mucous membranes are moist.     Pharynx: Oropharynx is clear. No oropharyngeal exudate or posterior oropharyngeal erythema.  Cardiovascular:     Rate and Rhythm: Normal rate and regular rhythm.     Heart sounds: No murmur heard.    No friction rub. No gallop.  Pulmonary:     Effort: Pulmonary effort is normal. No respiratory distress.     Breath sounds: Normal breath sounds. No wheezing, rhonchi or rales.  Abdominal:     General: Bowel sounds are normal. There is no distension.     Palpations: Abdomen is soft. There is no mass.     Tenderness: There is no abdominal tenderness.  Musculoskeletal:        General: No swelling.     Right lower leg: No edema.     Left lower leg: No edema.   Lymphadenopathy:     Cervical: No cervical adenopathy.     Upper Body:     Right upper body: No supraclavicular or axillary adenopathy.     Left upper body: No supraclavicular or axillary adenopathy.     Lower Body: No right inguinal adenopathy. No left inguinal adenopathy.  Skin:    General: Skin is warm.     Coloration: Skin is not jaundiced.     Findings: No lesion or rash.  Neurological:     General: No focal deficit present.     Mental Status: She is alert and oriented to person, place, and time. Mental status is at baseline.  Psychiatric:        Mood and Affect: Mood normal.        Behavior: Behavior normal.        Thought Content: Thought content normal.    LABS:    Latest Reference Range & Units 07/03/23 08:29  Sodium 135 - 145 mmol/L 128 (L)  Potassium 3.5 - 5.1 mmol/L 4.4  Chloride 98 - 111 mmol/L 93 (L)  CO2 22 - 32 mmol/L 26  Glucose 70 - 99 mg/dL 696 (H)  BUN 8 - 23 mg/dL 21  Creatinine 2.95 - 2.84 mg/dL 1.32 (H)  Calcium 8.9 - 10.3 mg/dL 9.3  Anion gap  5 - 15  9  Alkaline Phosphatase 38 - 126 U/L 70  Albumin 3.5 - 5.0 g/dL 4.1  AST 15 - 41 U/L 20  ALT 0 - 44 U/L 14  Total Protein 6.5 - 8.1 g/dL 7.4  Total Bilirubin 0.3 - 1.2 mg/dL 0.4  GFR, Est Non African American >60 mL/min 36 (L)  (L): Data is abnormally low (H): Data is abnormally high   Latest Reference Range & Units 07/03/23 08:29 07/03/23 11:19  Iron 28 - 170 ug/dL 59   UIBC ug/dL 161   TIBC 096 - 045 ug/dL 409   Saturation Ratios 10.4 - 31.8 % 19   Ferritin 11 - 307 ng/mL 193   Folate >5.9 ng/mL  11.0  Vitamin B12 180 - 914 pg/mL  496   ASSESSMENT & PLAN:  Assessment/Plan:  A 77 y.o. female with anemia secondary to chronic renal insufficiency.  Unfortunately, her hemoglobin remains significantly less than 10.  Her iron, vitamin B12 and folate levels all remain normal.  Based upon this, I will increase her Retacrit shots back to 40,000 monthly.  Her CBC will be checked monthly; I will see her  back in 3 months for repeat clinical assessment.  If her hemoglobin shows no improvement, I may need to consider a 2nd bone marrow biopsy with this patient.  The patient understands all the plans discussed today and is in agreement with them.    Laurey Salser Kirby Funk, MD

## 2023-07-03 ENCOUNTER — Other Ambulatory Visit: Payer: Self-pay | Admitting: Oncology

## 2023-07-03 ENCOUNTER — Inpatient Hospital Stay: Payer: Medicare Other

## 2023-07-03 ENCOUNTER — Telehealth: Payer: Self-pay

## 2023-07-03 ENCOUNTER — Inpatient Hospital Stay: Payer: Medicare Other | Attending: Oncology | Admitting: Oncology

## 2023-07-03 VITALS — BP 135/63 | HR 74 | Temp 98.7°F | Resp 16 | Ht 63.0 in | Wt 143.9 lb

## 2023-07-03 DIAGNOSIS — N184 Chronic kidney disease, stage 4 (severe): Secondary | ICD-10-CM | POA: Diagnosis not present

## 2023-07-03 DIAGNOSIS — N183 Chronic kidney disease, stage 3 unspecified: Secondary | ICD-10-CM | POA: Insufficient documentation

## 2023-07-03 DIAGNOSIS — I10 Essential (primary) hypertension: Secondary | ICD-10-CM | POA: Diagnosis not present

## 2023-07-03 DIAGNOSIS — I071 Rheumatic tricuspid insufficiency: Secondary | ICD-10-CM | POA: Insufficient documentation

## 2023-07-03 DIAGNOSIS — N189 Chronic kidney disease, unspecified: Secondary | ICD-10-CM

## 2023-07-03 DIAGNOSIS — D631 Anemia in chronic kidney disease: Secondary | ICD-10-CM | POA: Diagnosis not present

## 2023-07-03 DIAGNOSIS — I25119 Atherosclerotic heart disease of native coronary artery with unspecified angina pectoris: Secondary | ICD-10-CM | POA: Insufficient documentation

## 2023-07-03 DIAGNOSIS — E785 Hyperlipidemia, unspecified: Secondary | ICD-10-CM | POA: Diagnosis not present

## 2023-07-03 DIAGNOSIS — D649 Anemia, unspecified: Secondary | ICD-10-CM | POA: Diagnosis not present

## 2023-07-03 DIAGNOSIS — D509 Iron deficiency anemia, unspecified: Secondary | ICD-10-CM | POA: Insufficient documentation

## 2023-07-03 LAB — IRON AND TIBC
Iron: 59 ug/dL (ref 28–170)
Saturation Ratios: 19 % (ref 10.4–31.8)
TIBC: 316 ug/dL (ref 250–450)
UIBC: 257 ug/dL

## 2023-07-03 LAB — CMP (CANCER CENTER ONLY)
ALT: 14 U/L (ref 0–44)
AST: 20 U/L (ref 15–41)
Albumin: 4.1 g/dL (ref 3.5–5.0)
Alkaline Phosphatase: 70 U/L (ref 38–126)
Anion gap: 9 (ref 5–15)
BUN: 21 mg/dL (ref 8–23)
CO2: 26 mmol/L (ref 22–32)
Calcium: 9.3 mg/dL (ref 8.9–10.3)
Chloride: 93 mmol/L — ABNORMAL LOW (ref 98–111)
Creatinine: 1.51 mg/dL — ABNORMAL HIGH (ref 0.44–1.00)
GFR, Estimated: 36 mL/min — ABNORMAL LOW (ref >60)
Glucose, Bld: 111 mg/dL — ABNORMAL HIGH (ref 70–99)
Potassium: 4.4 mmol/L (ref 3.5–5.1)
Sodium: 128 mmol/L — ABNORMAL LOW (ref 135–145)
Total Bilirubin: 0.4 mg/dL (ref 0.3–1.2)
Total Protein: 7.4 g/dL (ref 6.5–8.1)

## 2023-07-03 LAB — CBC W DIFFERENTIAL (~~LOC~~ CC SCANNED REPORT)

## 2023-07-03 LAB — CBC AND DIFFERENTIAL
HCT: 26 — AB (ref 36–46)
Hemoglobin: 8.7 — AB (ref 12.0–16.0)
Neutrophils Absolute: 3.78
Platelets: 176 10*3/uL (ref 150–400)
WBC: 6.2

## 2023-07-03 LAB — CBC: RBC: 2.78 — AB (ref 3.87–5.11)

## 2023-07-03 LAB — FERRITIN: Ferritin: 193 ng/mL (ref 11–307)

## 2023-07-03 LAB — VITAMIN B12: Vitamin B-12: 496 pg/mL (ref 180–914)

## 2023-07-03 LAB — FOLATE: Folate: 11 ng/mL (ref >5.9)

## 2023-07-03 NOTE — Telephone Encounter (Signed)
  Latest Reference Range & Units 07/03/23 08:29 07/03/23 11:19   Iron 28 - 170 ug/dL 59    UIBC ug/dL 562    TIBC 130 - 865 ug/dL 784    Saturation Ratios 10.4 - 31.8 % 19    Ferritin 11 - 307 ng/mL 193    Folate >5.9 ng/mL   11.0  Vitamin B12 180 - 914 pg/mL   496    ASSESSMENT & PLAN:  Assessment/Plan:  A 77 y.o. female with anemia secondary to chronic renal insufficiency.  Unfortunately, her hemoglobin remains significantly less than 10.  Her iron, vitamin B12 and folate levels all remain normal.  Based upon this, I will increase her Retacrit shots back to 40,000 monthly.  Her CBC will be checked monthly; I will see her back in 3 months for repeat clinical assessment.  If her hemoglobin shows no improvement, I may need to consider a 2nd bone marrow biopsy with this patient.  The patient understands all the plans discussed today and is in agreement with them.     Dequincy Kirby Funk, MD

## 2023-07-09 NOTE — Progress Notes (Unsigned)
Cardiology Office Note:    Date:  07/09/2023   ID:  Angela Mccoy, DOB Jun 29, 1946, MRN [id number]  PCP:  Marylen Ponto, MD   [facility] Providers Cardiologist:  Norman Herrlich, MD     Referring MD: Marylen Ponto, MD   No chief complaint on file.   History of Present Illness:    Angela Mccoy is a 77 y.o. female with a hx of CAD s/p PCI and DES to LAD and RCA in 2003, hypertension, mitral valve prolapse, PVD, persistent atrial fibrillation, GERD, IBS, rheumatoid arthritis, HLD, anemia (followed at [facility] center by Dr. Melvyn Neth), CKD stage III.  ZIO monitor in November 2020 to assess for palpitation burden, rare ventricular ectopy with isolated PVCs, no A-fib or flutter, 11 triggered events that were associated with rare atrial premature contractions.  Symptoms were felt to be related to her anemia.  Renal ultrasound on 02/19/2020 without evidence of stenosis.  MPI on 05/26/2021, low risk study. 10/19/2019 left heart cath two-vessel CAD, widely patent LAD stent with 10 to 20% in-stent restenosis; large dominant RCA with proximal stent that has distal edges leading to a native vessel with 85% irregular stenosis s/p DES PCI with overlapping DES  Evaluated Dr. Dulce Sellar on 10/24/2022, at that time she was doing well from a cardiac perspective with no recurrent angina.  Plavix was continued for monotherapy.  Most recently evaluated in office on 04/10/2023, she endorsed some fatigue and DOE.   Lab work was unrevealing, her magnesium was low, BNP normal, repeated echocardiogram EF of 60 to 65%, grade 1 DD, mild to moderate TR.  Repeat echo 2 years  Past Medical History:  Diagnosis Date   Anemia 07/31/2017   Anemia in chronic kidney disease 10/03/2020   Anxiety and depression    Carotid bruit 02/26/2016   Carotid bruit present 02/26/2016   Cavitary pneumonia 03/20/2019   Onset of symptoms around late  Feb 2020 > all symptoms resolved p 5 days Meropenem  and rx x 2 weeks then changed to cleocin -  D/c cleocin 03/20/2019  With esr 12, wbc 7,800  -  Quant TB 03/20/2019  Neg  - 05/14/2019 cxr with minimal streaky residual, no as dz    Coronary artery disease involving native coronary artery 02/26/2016   Coronary artery disease involving native coronary artery of native heart with angina pectoris (HCC) 03/12/2016   Overview:  PCI and DES to LAD and RCA in 2003, cath 2010 wo restenosis   Essential hypertension 03/12/2016   GERD (gastroesophageal reflux disease)    High cholesterol 01/17/2017   History of colon polyps    Hx of diverticulitis of colon 09/11/2016   Hyperlipidemia 03/12/2016   Hypertension 01/27/2016   IBS (irritable bowel syndrome)    Insomnia 02/26/2016   Migraine with aura 02/26/2016   Mitral valve prolapse 02/26/2016   Peripheral vascular disease (HCC) 02/26/2016   Persistent atrial fibrillation (HCC) 11/06/2019   Pneumonia of right upper lobe due to infectious organism 02/26/2019   Pre-op evaluation 11/19/2019   Rheumatoid arthritis involving multiple joints (HCC) 02/26/2016   Rheumatoid arthritis involving multiple sites (HCC) 02/26/2016    Past Surgical History:  Procedure Laterality Date   ABDOMINAL HYSTERECTOMY     CHOLECYSTECTOMY     COLONOSCOPY  10/21/2014   Moderate predominantly sigmod diverticulosis. Small internal hemorrhoids with evidence of previous anorectal surgery (no active bleeding)   CORONARY STENT INTERVENTION N/A 10/18/2021   Procedure: CORONARY STENT INTERVENTION;  Surgeon:  Marykay Lex, MD;  Location: [facility] INVASIVE CV LAB;  Service: Cardiovascular;  Laterality: N/A;   ESOPHAGOGASTRODUODENOSCOPY  09/25/2012   Normal EGD   heart stents     x 4   LEFT HEART CATH AND CORONARY ANGIOGRAPHY N/A 10/18/2021   Procedure: LEFT HEART CATH AND CORONARY ANGIOGRAPHY;  Surgeon: Marykay Lex, MD;  Location: [facility] INVASIVE CV LAB;  Service: Cardiovascular;  Laterality: N/A;   rectal fissure      Current  Medications: No outpatient medications have been marked as taking for the 07/10/23 encounter (Appointment) with [name], NP.     Allergies:   Penicillins   Social History   Socioeconomic History   Marital status: Married    Spouse name: Not on file   Number of children: 2   Years of education: Not on file   Highest education level: Not on file  Occupational History   Occupation: Theme park manager  Tobacco Use   Smoking status: Former    Current packs/day: 0.00    Average packs/day: 1 pack/day for 18.0 years (18.0 ttl pk-yrs)    Types: Cigarettes    Start date: 12/25/1969    Quit date: 12/26/1987    Years since quitting: 35.5   Smokeless tobacco: Never  Vaping Use   Vaping status: Never Used  Substance and Sexual Activity   Alcohol use: No   Drug use: Never   Sexual activity: Not on file  Other Topics Concern   Not on file  Social History Narrative   Not on file   Social Determinants of Health   Financial Resource Strain: Not on file  Food Insecurity: Not on file  Transportation Needs: Not on file  Physical Activity: Not on file  Stress: Not on file  Social Connections: Not on file     Family History: The patient's family history includes Alzheimer's disease in her father; Breast cancer in her sister; Heart attack in her brother, mother, and sister; Lung cancer in her brother; Stroke in her maternal grandmother. There is no history of Asthma, Colon cancer, Rectal cancer, Stomach cancer, Esophageal cancer, Pancreatic cancer, or Liver cancer.  ROS:   Please see the history of present illness.    All other systems reviewed and are negative.  EKGs/Labs/Other Studies Reviewed:    The following studies were reviewed today:  Cardiac Studies & Procedures   CARDIAC CATHETERIZATION  CARDIAC CATHETERIZATION 10/18/2021  Narrative   Ost RCA to Prox RCA stent is 10% in-stent restenosis.   CULPRIT LESION: Prox RCA to Mid RCA lesion is 85% stenosed just beyond the  stent.   After scoring balloon angioplasty, A drug-eluting stent was successfully placed (overlapping previous stent) using a SYNERGY XD 2.75X28.  Postdilated to 3.1 mm   Post intervention, there is a 5% residual stenosis at the most significant segment, however the remainder has 0% visual stenosis.Marland Kitchen   ------------------------------   Mid LAD to Dist LAD previously placed stent is 10% stenosed (actually focally at small D2).   ------------------------------   The left ventricular systolic function is normal.  The left ventricular ejection fraction is 55-65% by visual estimate.   There is no aortic valve stenosis.  SUMMARY Two-vessel CAD: Widely patent LAD stent with maybe 10 to 15% ISR, otherwise normal LCA system with small nondominant LCx Large dominant RCA with proximal stent that has distal edge leading into a native vessel 85% irregular stenosis.  (CULPRIT LESION) Successful Scoring Balloon Angioplasty followed by DES PCI with an overlapping DES  stent (2.75 mm x 28 mm postdilated to 3.1 mm) Normal LVEF and EDP.   RECOMMENDATIONS Stable for Same-Day PCI. Is already on Plavix -> uninterrupted DAPT now for at least 6 months. Continue aggressive risk factor modification.  Follow-up with Dr. Dulce Sellar.    Bryan Lemma, MD  Findings Coronary Findings Diagnostic  Dominance: Right  Left Anterior Descending Mid LAD to Dist LAD lesion is 10% stenosed. The lesion is focal. The lesion was previously treated using a stent (unknown type) over 2 years ago. Previously placed stent displays restenosis. The stenosis was measured by a visual reading.  Second Diagonal Branch Vessel is small in size.  Third Diagonal Branch Vessel is small in size.  Left Circumflex Essentially courses almost to the ramus intermedius/high OM  Left Posterior Atrioventricular Artery Vessel is small in size.  Right Coronary Artery Vessel was injected. Vessel is normal in caliber. Ost RCA to Prox RCA lesion  is 10% stenosed. The lesion was previously treated using a stent (unknown type) over 2 years ago. Previously placed stent displays restenosis. Prox RCA to Mid RCA lesion is 85% stenosed. The lesion is located proximal to the major branch, segmental and irregular. Tapers from 85% up to about 50%.  Acute Marginal Branch Vessel is small in size.  Right Ventricular Branch Vessel is small in size.  Right Posterior Atrioventricular Artery Vessel is large in size.  First Right Posterolateral Branch Vessel is small in size.  Second Right Posterolateral Branch Vessel is moderate in size.  Intervention  Prox RCA to Mid RCA lesion Stent Lesion length:  24 mm. CATH VISTA GUIDE 6FR JR4 guide catheter was inserted. Lesion crossed with guidewire using a WIRE ASAHI PROWATER 180CM. Pre-stent angioplasty was performed using a BALLN SCOREFLEX 2.50X15. Maximum pressure:  14 atm. Inflation time: 30 sec. BALLN SAPPHIRE 2.0X15 -&gt; 12 ATM, 20 sec A drug-eluting stent was successfully placed using a SYNERGY XD 2.75X28. Maximum pressure: 18 atm. Inflation time: 30 sec. Stent strut is well apposed. At the original 85% lesion site there are 2 focal areas where there is maybe 10% residual stenosis despite aggressive post dilation. Stent overlaps previously placed stent. Post-stent angioplasty was performed using a BALLN [place] EMERGE MR 3.0X20. Maximum pressure:  20 atm. Inflation time:  20 sec. Post-Intervention Lesion Assessment The intervention was successful. Pre-interventional TIMI flow is 3. Post-intervention TIMI flow is 3. Treated lesion length:  26 mm. No complications occurred at this lesion. There is a 5% residual stenosis post intervention.   STRESS TESTS  MYOCARDIAL PERFUSION IMAGING 05/26/2021  Narrative  The left ventricular ejection fraction is hyperdynamic (>65%).  Nuclear stress EF: 67%.  There was no ST segment deviation noted during stress.  No T wave inversion was noted during stress.   This is a low risk study.  The study is normal.   ECHOCARDIOGRAM  ECHOCARDIOGRAM COMPLETE 05/08/2023  Narrative ECHOCARDIOGRAM REPORT    Patient Name:   EUGENE ZEIDERS [facility] Date of Exam: 05/08/2023 Medical Rec #:  [id number]                 Height:       63.0 in Accession #:    [id number]                Weight:       142.5 lb Date of Birth:  [birth year]                BSA:  1.674 m Patient Age:    76 years                  BP:           110/60 mmHg Patient Gender: F                         HR:           64 bpm. Exam Location:  [place]  Procedure: 2D Echo, Cardiac Doppler, Color Doppler and Strain Analysis  Indications:    Coronary artery disease involving native coronary artery of native heart with angina pectoris (HCC) [I25.119 (ICD-10-CM)]; Essential hypertension [I10 (ICD-10-CM)]; Hyperlipidemia, unspecified hyperlipidemia type [E78.5 (ICD-10-CM)]; CKD (chronic kidney disease) stage 4, GFR 15-29 ml/min (HCC) [N18.4 (ICD-10-CM)]; Iron deficiency anemia, unspecified iron deficiency anemia type [D50.9 (ICD-10-CM)]; Mild tricuspid regurgitation by prior echocardiogram [I07.1 (ICD-10-CM)]; Other fatigue [R53.83 (ICD-10-CM)]; DOE (dyspnea on exertion) [R06.09 (ICD-10-CM)]  History:        Patient has prior history of Echocardiogram examinations, most recent 02/16/2019. CAD, Arrythmias:Atrial Fibrillation; Risk Factors:Dyslipidemia and Hypertension.  Sonographer:    Margreta Journey RDCS Referring Phys: [phone] Zan Triska C Alyza Artiaga  IMPRESSIONS   1. Sigmoid septum. Left ventricular ejection fraction, by estimation, is 60 to 65%. The left ventricle has normal function. The left ventricle has no regional wall motion abnormalities. Left ventricular diastolic parameters are consistent with Grade I diastolic dysfunction (impaired relaxation). The average left ventricular global longitudinal strain is -17.2 %. The global longitudinal strain is abnormal. 2. Right ventricular  systolic function is normal. The right ventricular size is normal. There is normal pulmonary artery systolic pressure. 3. The mitral valve is normal in structure. No evidence of mitral valve regurgitation. No evidence of mitral stenosis. 4. Tricuspid valve regurgitation is mild to moderate. 5. The aortic valve is normal in structure. Aortic valve regurgitation is not visualized. No aortic stenosis is present. 6. The inferior vena cava is normal in size with greater than 50% respiratory variability, suggesting right atrial pressure of 3 mmHg.  FINDINGS Left Ventricle: Sigmoid septum. Left ventricular ejection fraction, by estimation, is 60 to 65%. The left ventricle has normal function. The left ventricle has no regional wall motion abnormalities. The average left ventricular global longitudinal strain is -17.2 %. The global longitudinal strain is abnormal. The left ventricular internal cavity size was normal in size. There is no left ventricular hypertrophy. Left ventricular diastolic parameters are consistent with Grade I diastolic dysfunction (impaired relaxation).  Right Ventricle: The right ventricular size is normal. No increase in right ventricular wall thickness. Right ventricular systolic function is normal. There is normal pulmonary artery systolic pressure. The tricuspid regurgitant velocity is 2.30 m/s, and with an assumed right atrial pressure of 3 mmHg, the estimated right ventricular systolic pressure is 24.2 mmHg.  Left Atrium: Left atrial size was normal in size.  Right Atrium: Right atrial size was normal in size.  Pericardium: There is no evidence of pericardial effusion.  Mitral Valve: The mitral valve is normal in structure. No evidence of mitral valve regurgitation. No evidence of mitral valve stenosis.  Tricuspid Valve: The tricuspid valve is normal in structure. Tricuspid valve regurgitation is mild to moderate. No evidence of tricuspid stenosis.  Aortic Valve: The  aortic valve is normal in structure. Aortic valve regurgitation is not visualized. No aortic stenosis is present.  Pulmonic Valve: The pulmonic valve was normal in structure. Pulmonic valve regurgitation is trivial. No evidence of pulmonic  stenosis.  Aorta: The aortic root is normal in size and structure.  Venous: The inferior vena cava is normal in size with greater than 50% respiratory variability, suggesting right atrial pressure of 3 mmHg.  IAS/Shunts: No atrial level shunt detected by color flow Doppler.   LEFT VENTRICLE PLAX 2D LVIDd:         4.10 cm   Diastology LVIDs:         2.60 cm   LV e' medial:    6.85 cm/s LV PW:         1.00 cm   LV E/e' medial:  10.6 LV IVS:        1.60 cm   LV e' lateral:   10.60 cm/s LVOT diam:     1.80 cm   LV E/e' lateral: 6.8 LVOT Area:     2.54 cm 2D Longitudinal Strain 2D Strain GLS Avg:     -17.2 %  RIGHT VENTRICLE             IVC RV Basal diam:  2.30 cm     IVC diam: 1.40 cm RV Mid diam:    1.50 cm RV S prime:     13.15 cm/s TAPSE (M-mode): 2.2 cm  LEFT ATRIUM             Index        RIGHT ATRIUM           Index LA diam:        3.80 cm 2.27 cm/m   RA Area:     12.00 cm LA Vol (A2C):   41.1 ml 24.55 ml/m  RA Volume:   26.20 ml  15.65 ml/m LA Vol (A4C):   53.9 ml 32.19 ml/m LA Biplane Vol: 48.1 ml 28.73 ml/m PULMONIC VALVE AORTA                 PR End Diast Vel: 4.49 msec Ao Root diam: 3.30 cm Ao Asc diam:  3.00 cm Ao Desc diam: 1.90 cm  MITRAL VALVE               TRICUSPID VALVE MV Area (PHT): 3.96 cm    TR Peak grad:   21.2 mmHg MV Decel Time: 192 msec    TR Vmax:        230.00 cm/s MV E velocity: 72.53 cm/s MV A velocity: 94.97 cm/s  SHUNTS MV E/A ratio:  0.76        Systemic Diam: 1.80 cm  Gypsy Balsam MD Electronically signed by Gypsy Balsam MD Signature Date/Time: 05/09/2023/8:50:36 AM    Final    MONITORS  LONG TERM MONITOR (3-14 DAYS) 11/24/2019  Narrative ZIO monitor was used for 3 days to  assess palpitation in the setting of coronary artery disease.  The study initiated 11/06/2019.  The rhythm throughout was sinus with minimum, average and maximum heart rates of 46, 60 and 99 bpm.  There were no pauses of 3 seconds or greater or episodes of AV nodal or sinus node block.  Ventricular ectopy was rare with isolated PVCs  Supraventricular ectopy was rare with isolated APCs.  There was one 6 beat run of ectopic atrial rhythm noted asymptomatic.  There were no episodes of atrial fibrillation or flutter.  There are 11 triggered events 1 associated with rare atrial premature contraction.  There were 8 diary events of chest pain palpitation shortness of breath and lightheadedness 1 associated with rare atrial premature contraction   Conclusion unremarkable 3-day ZIO  monitor triggered and diary events are generally unassociated with arrhythmia.            EKG:  EKG is not ordered today.   Recent Labs: 04/10/2023: NT-Pro BNP 498; TSH 4.410 05/08/2023: Magnesium 1.4 07/03/2023: ALT 14; BUN 21; Creatinine 1.51; Hemoglobin 8.7; Platelets 176; Potassium 4.4; Sodium 128  Recent Lipid Panel    Component Value Date/Time   CHOL 145 10/10/2021 0938   TRIG 248 (H) 10/10/2021 0938   HDL 42 10/10/2021 0938   CHOLHDL 3.5 10/10/2021 0938   LDLCALC 63 10/10/2021 0938     Risk Assessment/Calculations:      No BP recorded.  {Refresh Note OR Click here to enter BP  :1}***         Physical Exam:    VS:  There were no vitals taken for this visit.    Wt Readings from Last 3 Encounters:  07/03/23 143 lb 14.4 oz (65.3 kg)  06/12/23 145 lb 0.6 oz (65.8 kg)  05/15/23 146 lb (66.2 kg)     GEN:  Well nourished, well developed in no acute distress HEENT: Normal NECK: No JVD; No carotid bruits LYMPHATICS: No lymphadenopathy CARDIAC: RRR, + murmur 2/6 LSB, rubs, gallops RESPIRATORY:  Clear to auscultation without rales, wheezing or rhonchi  ABDOMEN: Soft, non-tender,  non-distended MUSCULOSKELETAL:  No edema; No deformity  SKIN: Warm and dry NEUROLOGIC:  Alert and oriented x 3 PSYCHIATRIC:  Normal affect   ASSESSMENT:    No diagnosis found.  PLAN:    In order of problems listed above:  CAD - s/p PCI and DES to LAD and RCA in 2003; most recent LHC in 2022 revealed two-vessel CAD s/p DES PCI with an overlapping DES stent to RCA. Stable with no anginal symptoms. No indication for ischemic evaluation.  Continue carvedilol 6.25 mg twice a day, Plavix 75 mg daily, Imdur 30 mg daily, pravastatin 40 mg daily, nitroglycerin as needed--has not needed.  Hypertension -blood pressure today is well-controlled at 110/60, continue carvedilol 6.25 mg twice a day, valsartan 80 mg once a day.  Fatigue/DOE -likely multifactorial related to her chronic anemia, symptoms are not consistent with prior CAD symptoms. Will repeat echo per below, BNP, TSH, BMET.  CBC is followed by oncology for anemia.  Mild TR/murmur -most recent echo in 2020 revealed trivial MR, will repeat echo.  HLD -LDL on 10/10/2021 was 63, continue pravastatin 40 mg daily.  CKD with anemia -follows with the cancer for infusions, goal is hemoglobin greater than 10, recent CBC on 4-24 Hgb 9.6, HCT 28.     Disposition-repeat echocardiogram.  TSH, magnesium, BMET, BNP.  Return in 3 months.      Medication Adjustments/Labs and Tests Ordered: Current medicines are reviewed at length with the patient today.  Concerns regarding medicines are outlined above.  No orders of the defined types were placed in this encounter.  No orders of the defined types were placed in this encounter.   There are no Patient Instructions on file for this visit.   Signed, [name], NP  07/09/2023 1:11 PM    [facility]

## 2023-07-10 ENCOUNTER — Inpatient Hospital Stay: Payer: Medicare Other

## 2023-07-10 ENCOUNTER — Encounter: Payer: Self-pay | Admitting: Cardiology

## 2023-07-10 ENCOUNTER — Ambulatory Visit (INDEPENDENT_AMBULATORY_CARE_PROVIDER_SITE_OTHER): Payer: Medicare Other | Admitting: Cardiology

## 2023-07-10 VITALS — BP 160/71 | HR 71 | Temp 98.8°F | Wt 142.0 lb

## 2023-07-10 VITALS — BP 130/65 | HR 70 | Ht 63.0 in | Wt 144.6 lb

## 2023-07-10 DIAGNOSIS — I25119 Atherosclerotic heart disease of native coronary artery with unspecified angina pectoris: Secondary | ICD-10-CM

## 2023-07-10 DIAGNOSIS — N184 Chronic kidney disease, stage 4 (severe): Secondary | ICD-10-CM | POA: Insufficient documentation

## 2023-07-10 DIAGNOSIS — I071 Rheumatic tricuspid insufficiency: Secondary | ICD-10-CM

## 2023-07-10 DIAGNOSIS — D631 Anemia in chronic kidney disease: Secondary | ICD-10-CM

## 2023-07-10 DIAGNOSIS — E785 Hyperlipidemia, unspecified: Secondary | ICD-10-CM | POA: Diagnosis not present

## 2023-07-10 DIAGNOSIS — I10 Essential (primary) hypertension: Secondary | ICD-10-CM

## 2023-07-10 DIAGNOSIS — D649 Anemia, unspecified: Secondary | ICD-10-CM | POA: Diagnosis not present

## 2023-07-10 DIAGNOSIS — D509 Iron deficiency anemia, unspecified: Secondary | ICD-10-CM | POA: Insufficient documentation

## 2023-07-10 DIAGNOSIS — N183 Chronic kidney disease, stage 3 unspecified: Secondary | ICD-10-CM | POA: Diagnosis not present

## 2023-07-10 DIAGNOSIS — N189 Chronic kidney disease, unspecified: Secondary | ICD-10-CM | POA: Diagnosis not present

## 2023-07-10 LAB — CBC AND DIFFERENTIAL
HCT: 26 — AB (ref 36–46)
Hemoglobin: 8.8 — AB (ref 12.0–16.0)
Neutrophils Absolute: 3.97
Platelets: 178 10*3/uL (ref 150–400)
WBC: 6.2

## 2023-07-10 LAB — CBC: RBC: 2.77 — AB (ref 3.87–5.11)

## 2023-07-10 MED ORDER — EPOETIN ALFA-EPBX 40000 UNIT/ML IJ SOLN
40000.0000 [IU] | Freq: Once | INTRAMUSCULAR | Status: AC
  Administered 2023-07-10: 40000 [IU] via SUBCUTANEOUS
  Filled 2023-07-10: qty 1

## 2023-07-10 NOTE — Patient Instructions (Signed)
Medication Instructions:  Your physician recommends that you continue on your current medications as directed. Please refer to the Current Medication list given to you today.  *If you need a refill on your cardiac medications before your next appointment, please call your pharmacy*   Lab Work: NONE If you have labs (blood work) drawn today and your tests are completely normal, you will receive your results only by: MyChart Message (if you have MyChart) OR A paper copy in the mail If you have any lab test that is abnormal or we need to change your treatment, we will call you to review the results.   Testing/Procedures: NONE   Follow-Up: At [facility], you and your health needs are our priority.  As part of our continuing mission to provide you with exceptional heart care, we have created designated Provider Care Teams.  These Care Teams include your primary Cardiologist (physician) and Advanced Practice Providers (APPs -  Physician Assistants and Nurse Practitioners) who all work together to provide you with the care you need, when you need it.  We recommend signing up for the patient portal called "MyChart".  Sign up information is provided on this After Visit Summary.  MyChart is used to connect with patients for Virtual Visits (Telemedicine).  Patients are able to view lab/test results, encounter notes, upcoming appointments, etc.  Non-urgent messages can be sent to your provider as well.   To learn more about what you can do with MyChart, go to [email or web address].    Your next appointment:   6 month(s)  Provider:   Brian Munley, MD    Other Instructions   

## 2023-07-10 NOTE — Patient Instructions (Signed)

## 2023-07-24 DIAGNOSIS — R509 Fever, unspecified: Secondary | ICD-10-CM | POA: Diagnosis not present

## 2023-07-24 DIAGNOSIS — R062 Wheezing: Secondary | ICD-10-CM | POA: Diagnosis not present

## 2023-07-26 DIAGNOSIS — R5381 Other malaise: Secondary | ICD-10-CM | POA: Diagnosis not present

## 2023-07-26 DIAGNOSIS — R509 Fever, unspecified: Secondary | ICD-10-CM | POA: Diagnosis not present

## 2023-07-26 DIAGNOSIS — E860 Dehydration: Secondary | ICD-10-CM | POA: Diagnosis not present

## 2023-07-26 DIAGNOSIS — N189 Chronic kidney disease, unspecified: Secondary | ICD-10-CM | POA: Diagnosis not present

## 2023-07-26 DIAGNOSIS — E7800 Pure hypercholesterolemia, unspecified: Secondary | ICD-10-CM | POA: Diagnosis not present

## 2023-07-26 DIAGNOSIS — I129 Hypertensive chronic kidney disease with stage 1 through stage 4 chronic kidney disease, or unspecified chronic kidney disease: Secondary | ICD-10-CM | POA: Diagnosis not present

## 2023-07-26 DIAGNOSIS — I2510 Atherosclerotic heart disease of native coronary artery without angina pectoris: Secondary | ICD-10-CM | POA: Diagnosis not present

## 2023-07-26 DIAGNOSIS — D649 Anemia, unspecified: Secondary | ICD-10-CM | POA: Diagnosis not present

## 2023-07-26 DIAGNOSIS — Z880 Allergy status to penicillin: Secondary | ICD-10-CM | POA: Diagnosis not present

## 2023-07-26 DIAGNOSIS — Z1152 Encounter for screening for COVID-19: Secondary | ICD-10-CM | POA: Diagnosis not present

## 2023-08-04 ENCOUNTER — Other Ambulatory Visit: Payer: Self-pay | Admitting: Cardiology

## 2023-08-06 ENCOUNTER — Telehealth: Payer: Self-pay

## 2023-08-06 NOTE — Telephone Encounter (Addendum)
Pt LVM wanting to schedule an appt to see Dr. Melvyn Neth tomorrow.. She has been having fevers of up to 102 for the last month, she has even going to the hospital where viral exams were performed and came out to be negative.. She is concerned and wants him to evaluate her if possible. Her next scheduled appt with him is 10/01/2023.

## 2023-08-07 ENCOUNTER — Inpatient Hospital Stay: Payer: Medicare Other | Attending: Oncology

## 2023-08-07 ENCOUNTER — Inpatient Hospital Stay: Payer: Medicare Other

## 2023-08-07 VITALS — BP 116/53 | HR 63 | Temp 98.7°F | Resp 20 | Ht 63.0 in | Wt 136.1 lb

## 2023-08-07 DIAGNOSIS — D631 Anemia in chronic kidney disease: Secondary | ICD-10-CM | POA: Insufficient documentation

## 2023-08-07 DIAGNOSIS — D649 Anemia, unspecified: Secondary | ICD-10-CM | POA: Diagnosis not present

## 2023-08-07 DIAGNOSIS — N183 Chronic kidney disease, stage 3 unspecified: Secondary | ICD-10-CM | POA: Insufficient documentation

## 2023-08-07 DIAGNOSIS — N189 Chronic kidney disease, unspecified: Secondary | ICD-10-CM | POA: Diagnosis not present

## 2023-08-07 LAB — CBC AND DIFFERENTIAL
HCT: 24 — AB (ref 36–46)
Hemoglobin: 8.1 — AB (ref 12.0–16.0)
Neutrophils Absolute: 8.63
Platelets: 244 10*3/uL (ref 150–400)
WBC: 11.5

## 2023-08-07 LAB — CBC: RBC: 2.65 — AB (ref 3.87–5.11)

## 2023-08-07 MED ORDER — EPOETIN ALFA-EPBX 40000 UNIT/ML IJ SOLN
40000.0000 [IU] | Freq: Once | INTRAMUSCULAR | Status: AC
  Administered 2023-08-07: 40000 [IU] via SUBCUTANEOUS
  Filled 2023-08-07: qty 1

## 2023-08-07 NOTE — Patient Instructions (Signed)

## 2023-08-10 DIAGNOSIS — Z6824 Body mass index (BMI) 24.0-24.9, adult: Secondary | ICD-10-CM | POA: Diagnosis not present

## 2023-08-10 DIAGNOSIS — D509 Iron deficiency anemia, unspecified: Secondary | ICD-10-CM | POA: Diagnosis not present

## 2023-08-10 DIAGNOSIS — N179 Acute kidney failure, unspecified: Secondary | ICD-10-CM | POA: Diagnosis not present

## 2023-09-04 ENCOUNTER — Inpatient Hospital Stay: Payer: Medicare Other

## 2023-09-04 ENCOUNTER — Inpatient Hospital Stay: Payer: Medicare Other | Attending: Oncology

## 2023-09-04 ENCOUNTER — Other Ambulatory Visit: Payer: Self-pay

## 2023-09-04 ENCOUNTER — Telehealth: Payer: Self-pay | Admitting: Cardiology

## 2023-09-04 VITALS — BP 135/50 | HR 68 | Temp 97.7°F | Resp 18 | Ht 63.0 in | Wt 138.0 lb

## 2023-09-04 DIAGNOSIS — N183 Chronic kidney disease, stage 3 unspecified: Secondary | ICD-10-CM | POA: Diagnosis not present

## 2023-09-04 DIAGNOSIS — Z23 Encounter for immunization: Secondary | ICD-10-CM | POA: Diagnosis not present

## 2023-09-04 DIAGNOSIS — D631 Anemia in chronic kidney disease: Secondary | ICD-10-CM | POA: Insufficient documentation

## 2023-09-04 DIAGNOSIS — N189 Chronic kidney disease, unspecified: Secondary | ICD-10-CM | POA: Diagnosis not present

## 2023-09-04 DIAGNOSIS — D649 Anemia, unspecified: Secondary | ICD-10-CM | POA: Diagnosis not present

## 2023-09-04 LAB — CBC AND DIFFERENTIAL
HCT: 26 — AB (ref 36–46)
Hemoglobin: 8.9 — AB (ref 12.0–16.0)
Neutrophils Absolute: 4.51
Platelets: 193 10*3/uL (ref 150–400)
WBC: 7.4

## 2023-09-04 LAB — CBC: RBC: 2.92 — AB (ref 3.87–5.11)

## 2023-09-04 MED ORDER — EPOETIN ALFA-EPBX 40000 UNIT/ML IJ SOLN
40000.0000 [IU] | Freq: Once | INTRAMUSCULAR | Status: AC
  Administered 2023-09-04: 40000 [IU] via SUBCUTANEOUS
  Filled 2023-09-04: qty 1

## 2023-09-04 MED ORDER — INFLUENZA VAC A&B SURF ANT ADJ 0.5 ML IM SUSY
0.5000 mL | PREFILLED_SYRINGE | Freq: Once | INTRAMUSCULAR | Status: AC
  Administered 2023-09-04: 0.5 mL via INTRAMUSCULAR
  Filled 2023-09-04: qty 0.5

## 2023-09-04 NOTE — Patient Instructions (Signed)

## 2023-09-04 NOTE — Telephone Encounter (Signed)
Pt c/o BP issue: STAT if pt c/o blurred vision, one-sided weakness or slurred speech  1. What are your last 5 BP readings? 115/45  2. Are you having any other symptoms (ex. Dizziness, headache, blurred vision, passed out)? Dizziness, weakness, has fallen a couple of times.  3. What is your BP issue? Is running low.

## 2023-09-04 NOTE — Telephone Encounter (Signed)
Called patient and informed her of Dr. Madireddy's recommendation below:  "Unclear what the causes of the symptoms would be. Is she having syncopal episodes. Her blood pressures are not low enough to explain the symptoms.  If she is having syncopal episodes should be evaluated in the ER. Otherwise can schedule her for a follow-up visit in the office.  I am not sure if her medication list is currently accurate, but if she is taking valsartan, you can ask her to hold valsartan until she comes in for follow-up visit at the office."  Patient did not want to go to the ER. She stated that if she had another syncopal episode she would go to the ER. An appointment for her to see Wallis Bamberg, NP on 9/23 was scheduled. Also instructed patient to hold her valsartan until her follow up visit per Dr. Vincent Gros. Patient was agreeable with this plan and had no further questions at this time.

## 2023-09-04 NOTE — Telephone Encounter (Signed)
Called patient and she reported that her blood pressure has been running low. Her latest blood pressures are 113/45 and 123/49. She did not know her heart rates. She states that her blood pressure usually runs high and she is on a lot of blood pressure medication. She has been dizzy, light headed and she has fallen multiple times. She is also getting infusions at the outpatient center at [facility] because she is anemic. Please advise.

## 2023-09-05 IMAGING — CT CT ABD-PELV W/O CM
2 of 4 series · 16 of 46 positions shown, 18 images · non-contrast
Comparison: 09/13/2016

CLINICAL DATA: Left lower quadrant pain, nausea, diarrhea, treated
for diverticulitis

EXAM:
CT ABDOMEN AND PELVIS WITHOUT CONTRAST
TECHNIQUE: Multidetector CT imaging of the abdomen and pelvis was performed
following the standard protocol without IV contrast. Oral enteric
contrast was administered.

[Series 2: axial st · axial · 0.87mm/px · z∈[-512,-72]mm · 13 of 98 slices shown, 15 images]
[im 5/98  soft-tissue]
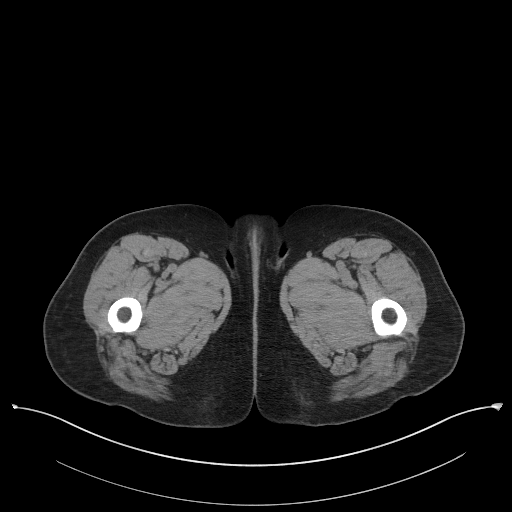
[im 5/98  bone]
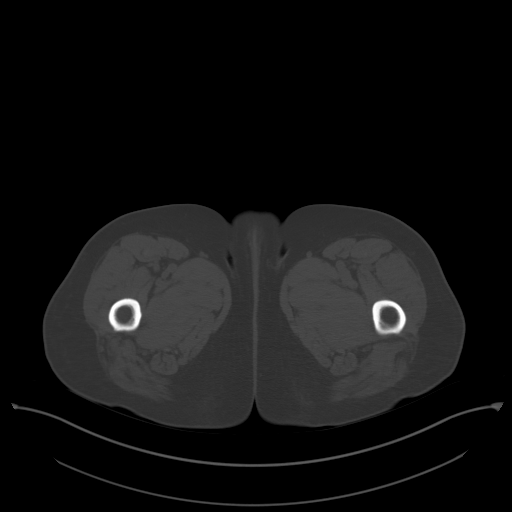
[im 13/98  soft-tissue]
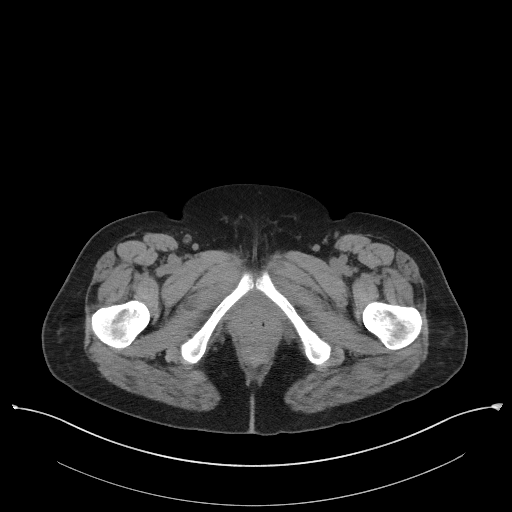
[im 21/98  soft-tissue]
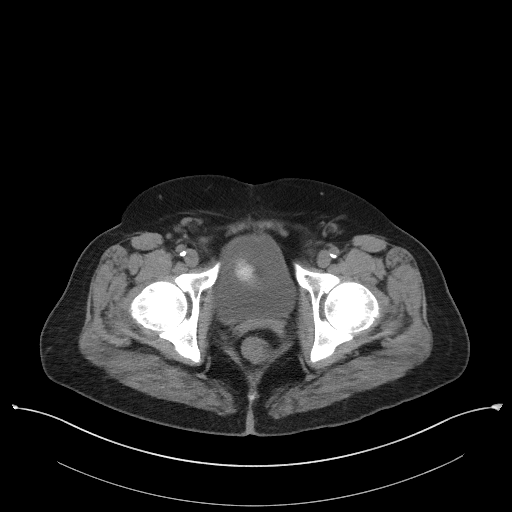
[im 29/98  soft-tissue]
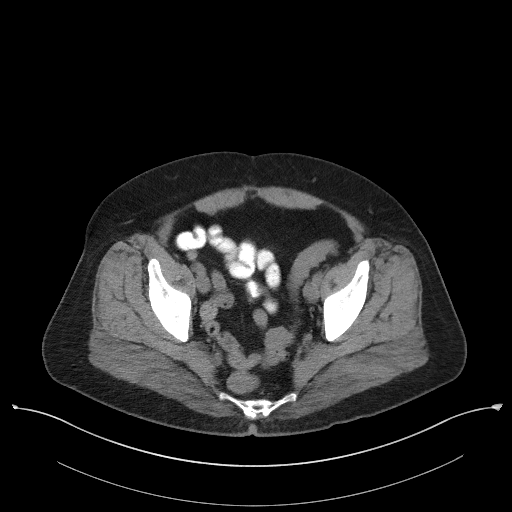
[im 33/98  soft-tissue]
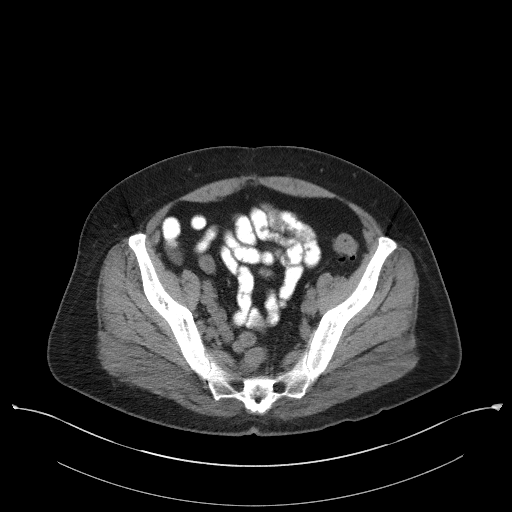
[im 41/98  soft-tissue]
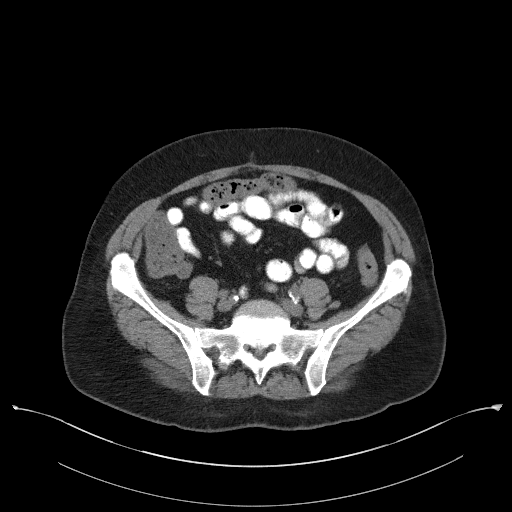
[im 49/98  soft-tissue]
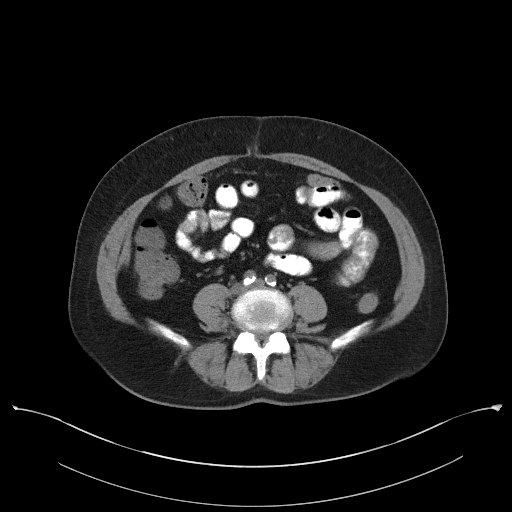
[im 57/98  soft-tissue]
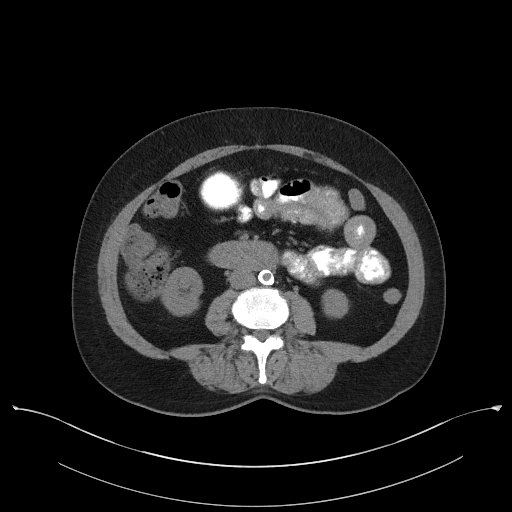
[im 65/98  soft-tissue]
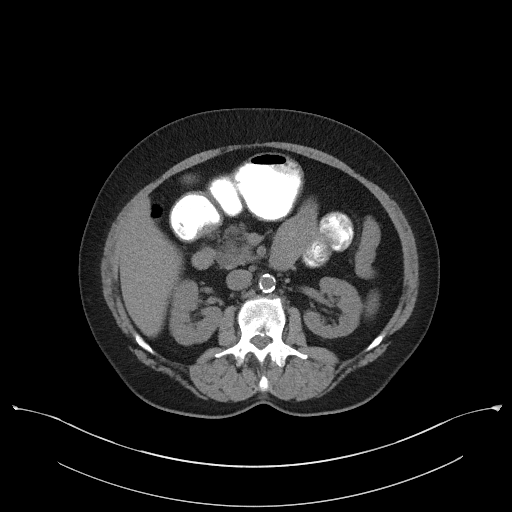
[im 65/98  bone]
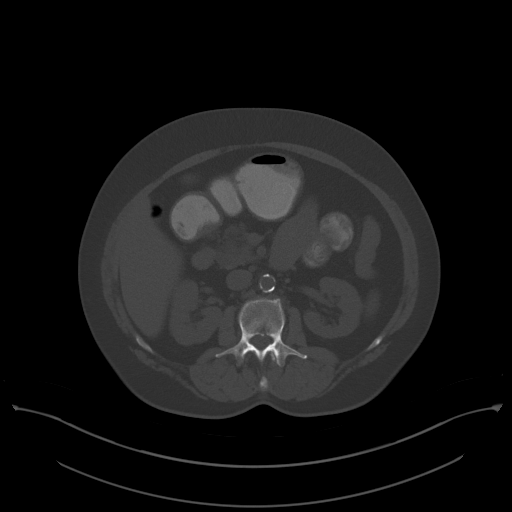
[im 69/98  soft-tissue]
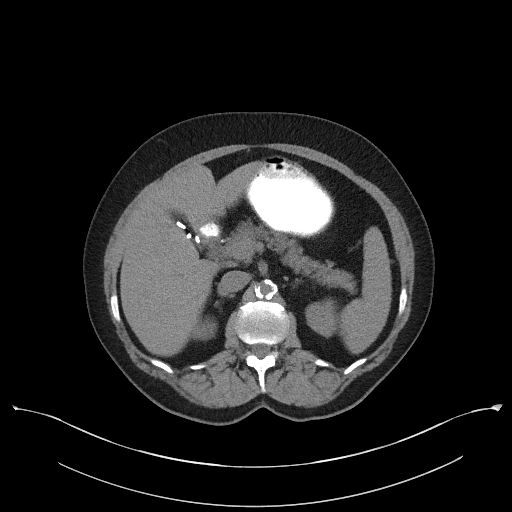
[im 77/98  soft-tissue]
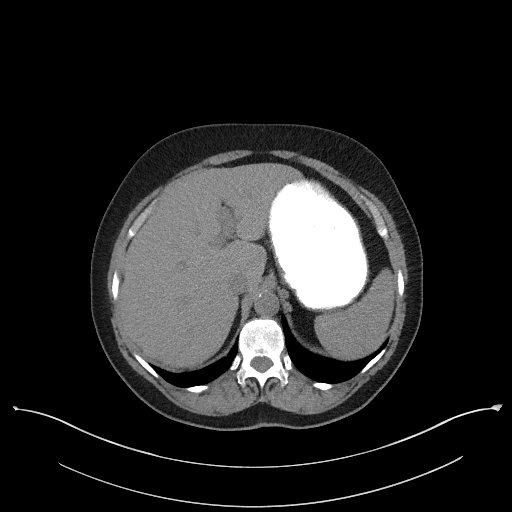
[im 85/98  soft-tissue]
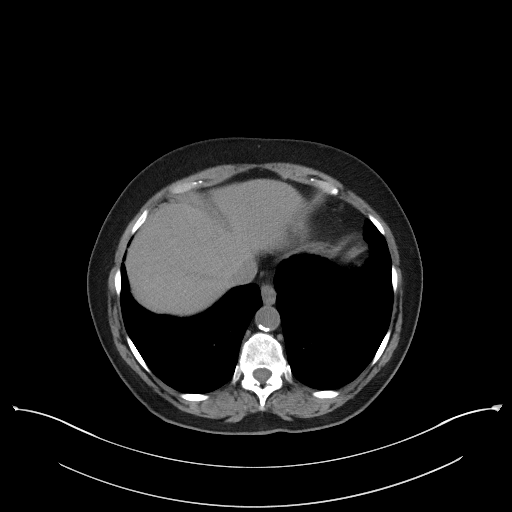
[im 93/98  soft-tissue]
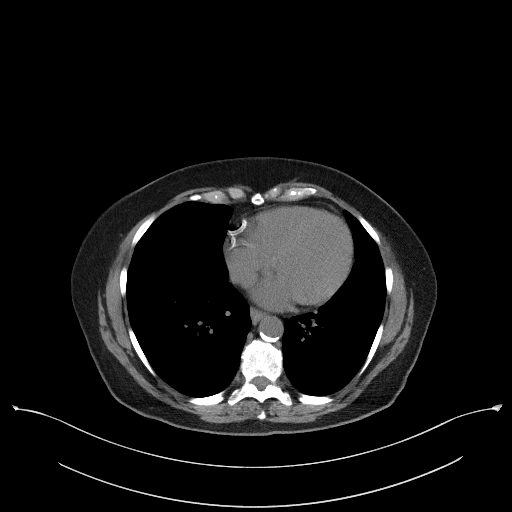

[Series 5: coronal st · coronal · 0.73mm/px · 3 of 97 slices shown]
[im 33/97  soft-tissue]
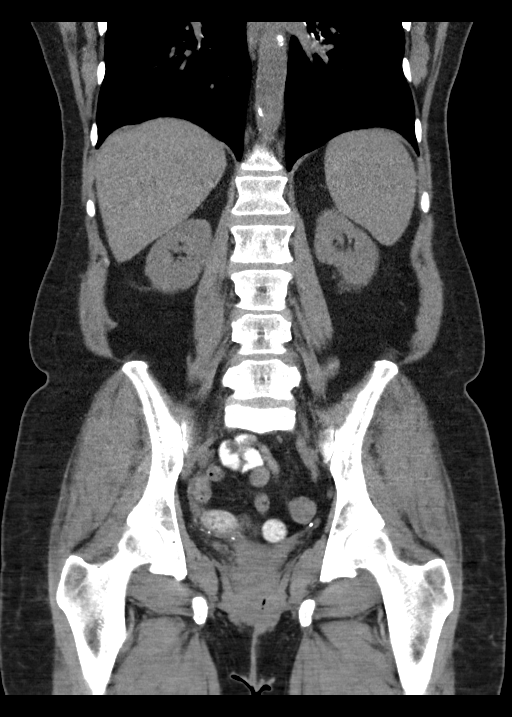
[im 43/97  soft-tissue]
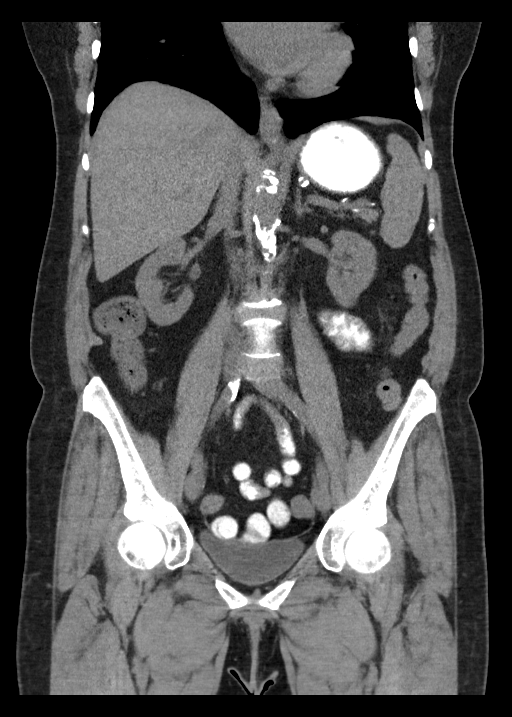
[im 54/97  soft-tissue]
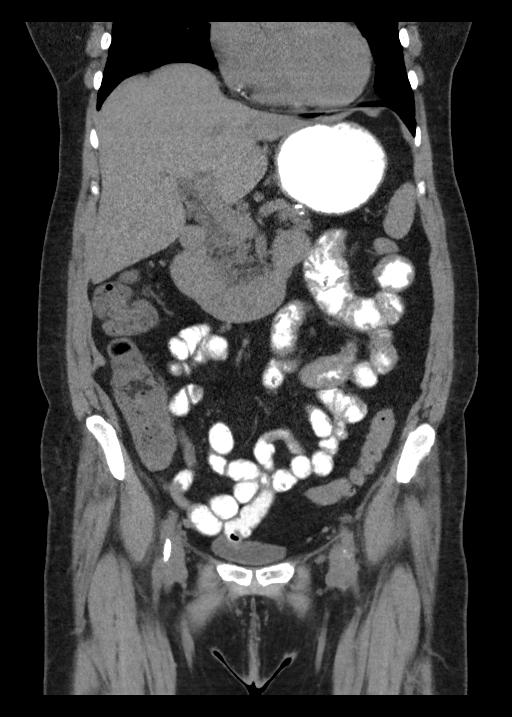

[16 of 46 positions shown; findings below may reference images not displayed]

FINDINGS: Lower chest: No acute abnormality. Coronary artery calcifications
and/or stents.

Hepatobiliary: No focal liver abnormality is seen. Status post
cholecystectomy. No biliary dilatation.

Pancreas: Unremarkable. No pancreatic ductal dilatation or
surrounding inflammatory changes.

Spleen: Normal in size without significant abnormality.

Adrenals/Urinary Tract: Adrenal glands are unremarkable. Kidneys are
normal, without renal calculi, solid lesion, or hydronephrosis.
Bladder is unremarkable.

Stomach/Bowel: Stomach is within normal limits. Appendix is
surgically absent. No evidence of bowel wall thickening, distention,
or inflammatory changes. Occasional descending and sigmoid
diverticula.

Vascular/Lymphatic: Aortic atherosclerosis. No enlarged abdominal or
pelvic lymph nodes.

Reproductive: Status post hysterectomy.

Other: No abdominal wall hernia or abnormality. No abdominopelvic
ascites.

Musculoskeletal: No acute or significant osseous findings.
IMPRESSION: 1. No non-contrast CT findings of the abdomen or pelvis to explain
left lower quadrant abdominal pain.
2. Occasional descending and sigmoid diverticula without evidence of
acute diverticulitis.
3. Status post cholecystectomy, appendectomy, and hysterectomy.
4. Coronary artery disease.

Aortic Atherosclerosis ([id number]).

## 2023-09-16 NOTE — Progress Notes (Signed)
 8  Cardiology Office Note:    Date:  09/17/2023   ID:  Wander Genson, DOB Nov 25, 1946, MRN [id number]  PCP:  Marylen Ponto, MD   [facility] Providers Cardiologist:  Norman Herrlich, MD     Referring MD: Marylen Ponto, MD   CC: follow up hypotension  History of Present Illness:    Kenzee Schoenecker is a 77 y.o. female with a hx of CAD s/p PCI and DES to LAD and RCA in 2003, hypertension, mitral valve prolapse, PVD, persistent atrial fibrillation, GERD, IBS, rheumatoid arthritis, HLD, anemia (followed at [facility] by Dr. Melvyn Neth), CKD stage III.  Echo 05/09/23 EF 60-65%, grade I DD, TR mild to moderate MPI on 05/26/2021, low risk study. Renal ultrasound on 02/19/2020 without evidence of stenosis.  ZIO monitor in November 2020 to assess for palpitation burden, rare ventricular ectopy with isolated PVCs, no A-fib or flutter, 11 triggered events that were associated with rare atrial premature contractions.  Symptoms were felt to be related to her anemia.  Renal ultrasound on 02/19/2020 without evidence of stenosis.  10/19/2019 left heart cath two-vessel CAD, widely patent LAD stent with 10 to 20% in-stent restenosis; large dominant RCA with proximal stent that has distal edges leading to a native vessel with 85% irregular stenosis s/p DES PCI with overlapping DES   She presents today for follow-up of her hypertension.  She has had a rough couple of months, around July developed intermittent fevers, weight loss, diarrhea.  She has had an episode where she was dehydrated requiring IV fluids in the emergency department, her blood pressure is also been decreased at times.  She previously called our triage line and was advised to hold her valsartan however when she did this her blood pressure spiked and she has since resumed her valsartan.  She states around July there was some type of change that was made to her infusion for her anemia, since then she will develop  a fever a few days later.  She has been evaluated by her PCP on multiple occasions, workup has been unrevealing so far.  She is trying to stay well-hydrated.  She has had an unintentional weight loss of approximately 15 pounds since April 2024. She denies chest pain, palpitations, dyspnea, pnd, orthopnea, n, v, dizziness, syncope, edema, weight gain, or early satiety. .  Past Medical History:  Diagnosis Date   Anemia 07/31/2017   Anemia in chronic kidney disease 10/03/2020   Anxiety and depression    Carotid bruit 02/26/2016   Carotid bruit present 02/26/2016   Cavitary pneumonia 03/20/2019   Onset of symptoms around late  Feb 2020 > all symptoms resolved p 5 days Meropenem and rx x 2 weeks then changed to cleocin -  D/c cleocin 03/20/2019  With esr 12, wbc 7,800  -  Quant TB 03/20/2019  Neg  - 05/14/2019 cxr with minimal streaky residual, no as dz    Coronary artery disease involving native coronary artery 02/26/2016   Coronary artery disease involving native coronary artery of native heart with angina pectoris (HCC) 03/12/2016   Overview:  PCI and DES to LAD and RCA in 2003, cath 2010 wo restenosis   Essential hypertension 03/12/2016   GERD (gastroesophageal reflux disease)    High cholesterol 01/17/2017   History of colon polyps    Hx of diverticulitis of colon 09/11/2016   Hyperlipidemia 03/12/2016   Hypertension 01/27/2016   IBS (irritable bowel syndrome)    Insomnia 02/26/2016  Migraine with aura 02/26/2016   Mitral valve prolapse 02/26/2016   Peripheral vascular disease (HCC) 02/26/2016   Persistent atrial fibrillation (HCC) 11/06/2019   Pneumonia of right upper lobe due to infectious organism 02/26/2019   Pre-op evaluation 11/19/2019   Rheumatoid arthritis involving multiple joints (HCC) 02/26/2016   Rheumatoid arthritis involving multiple sites (HCC) 02/26/2016    Past Surgical History:  Procedure Laterality Date   ABDOMINAL HYSTERECTOMY     CHOLECYSTECTOMY     COLONOSCOPY   10/21/2014   Moderate predominantly sigmod diverticulosis. Small internal hemorrhoids with evidence of previous anorectal surgery (no active bleeding)   CORONARY STENT INTERVENTION N/A 10/18/2021   Procedure: CORONARY STENT INTERVENTION;  Surgeon: Marykay Lex, MD;  Location: [facility] INVASIVE CV LAB;  Service: Cardiovascular;  Laterality: N/A;   ESOPHAGOGASTRODUODENOSCOPY  09/25/2012   Normal EGD   heart stents     x 4   LEFT HEART CATH AND CORONARY ANGIOGRAPHY N/A 10/18/2021   Procedure: LEFT HEART CATH AND CORONARY ANGIOGRAPHY;  Surgeon: Marykay Lex, MD;  Location: [facility] INVASIVE CV LAB;  Service: Cardiovascular;  Laterality: N/A;   rectal fissure      Current Medications: Current Meds  Medication Sig   carvedilol (COREG) 6.25 MG tablet Take 1 tablet (6.25 mg total) by mouth 2 (two) times daily with a meal.   chlorhexidine (PERIDEX) 0.12 % solution SMARTSIG:15 Milliliter(s) By Mouth   chlorthalidone (HYGROTON) 25 MG tablet Take 25 mg by mouth daily.   citalopram (CELEXA) 10 MG tablet Take 10 mg by mouth daily.   clopidogrel (PLAVIX) 75 MG tablet TAKE 1 TABLET(75 MG) BY MOUTH DAILY   isosorbide mononitrate (IMDUR) 30 MG 24 hr tablet Take 1 tablet (30 mg total) by mouth daily.   Magnesium Oxide -Mg Supplement 200 MG TABS Take 1 tablet (200 mg total) by mouth daily.   Multiple Vitamins-Minerals (MULTIVITAMIN WITH MINERALS) tablet Take 1 tablet by mouth daily.   nitroGLYCERIN (NITROSTAT) 0.4 MG SL tablet ONE TABLET UNDER TONGUE AS NEEDED FOR CHEST PAIN EVERY 5 MINUTES   pantoprazole (PROTONIX) 40 MG tablet Take 40 mg by mouth 2 (two) times daily.   potassium chloride SA (KLOR-CON) 20 MEQ tablet Take 20 mEq by mouth 2 (two) times daily.   pravastatin (PRAVACHOL) 40 MG tablet Take 40 mg by mouth daily.   QUEtiapine (SEROQUEL) 300 MG tablet Take 300 mg by mouth at bedtime.   traMADol (ULTRAM) 50 MG tablet Take 50 mg by mouth 3 (three) times daily.   valsartan (DIOVAN) 80 MG tablet Take 1  tablet (80 mg total) by mouth daily.     Allergies:   Penicillins   Social History   Socioeconomic History   Marital status: Married    Spouse name: Not on file   Number of children: 2   Years of education: Not on file   Highest education level: Not on file  Occupational History   Occupation: Theme park manager  Tobacco Use   Smoking status: Former    Current packs/day: 0.00    Average packs/day: 1 pack/day for 18.0 years (18.0 ttl pk-yrs)    Types: Cigarettes    Start date: 12/25/1969    Quit date: 12/26/1987    Years since quitting: 35.7   Smokeless tobacco: Never  Vaping Use   Vaping status: Never Used  Substance and Sexual Activity   Alcohol use: No   Drug use: Never   Sexual activity: Not on file  Other Topics Concern   Not on file  Social History Narrative   Not on file   Social Determinants of Health   Financial Resource Strain: Not on file  Food Insecurity: Not on file  Transportation Needs: Not on file  Physical Activity: Not on file  Stress: Not on file  Social Connections: Not on file     Family History: The patient's family history includes Alzheimer's disease in her father; Breast cancer in her sister; Heart attack in her brother, mother, and sister; Lung cancer in her brother; Stroke in her maternal grandmother. There is no history of Asthma, Colon cancer, Rectal cancer, Stomach cancer, Esophageal cancer, Pancreatic cancer, or Liver cancer.  ROS:   Please see the history of present illness.    All other systems reviewed and are negative.  EKGs/Labs/Other Studies Reviewed:    The following studies were reviewed today:  Cardiac Studies & Procedures   CARDIAC CATHETERIZATION  CARDIAC CATHETERIZATION 10/18/2021  Narrative   Ost RCA to Prox RCA stent is 10% in-stent restenosis.   CULPRIT LESION: Prox RCA to Mid RCA lesion is 85% stenosed just beyond the stent.   After scoring balloon angioplasty, A drug-eluting stent was successfully placed  (overlapping previous stent) using a SYNERGY XD 2.75X28.  Postdilated to 3.1 mm   Post intervention, there is a 5% residual stenosis at the most significant segment, however the remainder has 0% visual stenosis.Marland Kitchen   ------------------------------   Mid LAD to Dist LAD previously placed stent is 10% stenosed (actually focally at small D2).   ------------------------------   The left ventricular systolic function is normal.  The left ventricular ejection fraction is 55-65% by visual estimate.   There is no aortic valve stenosis.  SUMMARY Two-vessel CAD: Widely patent LAD stent with maybe 10 to 15% ISR, otherwise normal LCA system with small nondominant LCx Large dominant RCA with proximal stent that has distal edge leading into a native vessel 85% irregular stenosis.  (CULPRIT LESION) Successful Scoring Balloon Angioplasty followed by DES PCI with an overlapping DES stent (2.75 mm x 28 mm postdilated to 3.1 mm) Normal LVEF and EDP.   RECOMMENDATIONS Stable for Same-Day PCI. Is already on Plavix -> uninterrupted DAPT now for at least 6 months. Continue aggressive risk factor modification.  Follow-up with Dr. Dulce Sellar.    Bryan Lemma, MD  Findings Coronary Findings Diagnostic  Dominance: Right  Left Anterior Descending Mid LAD to Dist LAD lesion is 10% stenosed. The lesion is focal. The lesion was previously treated using a stent (unknown type) over 2 years ago. Previously placed stent displays restenosis. The stenosis was measured by a visual reading.  Second Diagonal Branch Vessel is small in size.  Third Diagonal Branch Vessel is small in size.  Left Circumflex Essentially courses almost to the ramus intermedius/high OM  Left Posterior Atrioventricular Artery Vessel is small in size.  Right Coronary Artery Vessel was injected. Vessel is normal in caliber. Ost RCA to Prox RCA lesion is 10% stenosed. The lesion was previously treated using a stent (unknown type) over 2  years ago. Previously placed stent displays restenosis. Prox RCA to Mid RCA lesion is 85% stenosed. The lesion is located proximal to the major branch, segmental and irregular. Tapers from 85% up to about 50%.  Acute Marginal Branch Vessel is small in size.  Right Ventricular Branch Vessel is small in size.  Right Posterior Atrioventricular Artery Vessel is large in size.  First Right Posterolateral Branch Vessel is small in size.  Second Right Posterolateral Branch Vessel is moderate in size.  Intervention  Prox RCA to Mid RCA lesion Stent Lesion length:  24 mm. CATH VISTA GUIDE 6FR JR4 guide catheter was inserted. Lesion crossed with guidewire using a WIRE ASAHI PROWATER 180CM. Pre-stent angioplasty was performed using a BALLN SCOREFLEX 2.50X15. Maximum pressure:  14 atm. Inflation time: 30 sec. BALLN SAPPHIRE 2.0X15 -&gt; 12 ATM, 20 sec A drug-eluting stent was successfully placed using a SYNERGY XD 2.75X28. Maximum pressure: 18 atm. Inflation time: 30 sec. Stent strut is well apposed. At the original 85% lesion site there are 2 focal areas where there is maybe 10% residual stenosis despite aggressive post dilation. Stent overlaps previously placed stent. Post-stent angioplasty was performed using a BALLN [employer] EMERGE MR 3.0X20. Maximum pressure:  20 atm. Inflation time:  20 sec. Post-Intervention Lesion Assessment The intervention was successful. Pre-interventional TIMI flow is 3. Post-intervention TIMI flow is 3. Treated lesion length:  26 mm. No complications occurred at this lesion. There is a 5% residual stenosis post intervention.   STRESS TESTS  MYOCARDIAL PERFUSION IMAGING 05/26/2021  Narrative  The left ventricular ejection fraction is hyperdynamic (>65%).  Nuclear stress EF: 67%.  There was no ST segment deviation noted during stress.  No T wave inversion was noted during stress.  This is a low risk study.  The study is normal.   ECHOCARDIOGRAM  ECHOCARDIOGRAM  COMPLETE 05/08/2023  Narrative ECHOCARDIOGRAM REPORT    Patient Name:   LAQUANDRA VOUGHT [facility] Date of Exam: 05/08/2023 Medical Rec #:  [id number]                 Height:       63.0 in Accession #:    [id number]                Weight:       142.5 lb Date of Birth:  1946/09/22                BSA:          1.674 m Patient Age:    76 years                  BP:           110/60 mmHg Patient Gender: F                         HR:           64 bpm. Exam Location:  [place]  Procedure: 2D Echo, Cardiac Doppler, Color Doppler and Strain Analysis  Indications:    Coronary artery disease involving native coronary artery of native heart with angina pectoris (HCC) [I25.119 (ICD-10-CM)]; Essential hypertension [I10 (ICD-10-CM)]; Hyperlipidemia, unspecified hyperlipidemia type [E78.5 (ICD-10-CM)]; CKD (chronic kidney disease) stage 4, GFR 15-29 ml/min (HCC) [N18.4 (ICD-10-CM)]; Iron deficiency anemia, unspecified iron deficiency anemia type [D50.9 (ICD-10-CM)]; Mild tricuspid regurgitation by prior echocardiogram [I07.1 (ICD-10-CM)]; Other fatigue [R53.83 (ICD-10-CM)]; DOE (dyspnea on exertion) [R06.09 (ICD-10-CM)]  History:        Patient has prior history of Echocardiogram examinations, most recent 02/16/2019. CAD, Arrythmias:Atrial Fibrillation; Risk Factors:Dyslipidemia and Hypertension.  Sonographer:    Margreta Journey RDCS Referring Phys: [phone] Yanett Conkright C Edelyn Heidel  IMPRESSIONS   1. Sigmoid septum. Left ventricular ejection fraction, by estimation, is 60 to 65%. The left ventricle has normal function. The left ventricle has no regional wall motion abnormalities. Left ventricular diastolic parameters are consistent with Grade I diastolic dysfunction (impaired relaxation). The average left ventricular global longitudinal strain is -17.2 %.  The global longitudinal strain is abnormal. 2. Right ventricular systolic function is normal. The right ventricular size is normal. There is normal  pulmonary artery systolic pressure. 3. The mitral valve is normal in structure. No evidence of mitral valve regurgitation. No evidence of mitral stenosis. 4. Tricuspid valve regurgitation is mild to moderate. 5. The aortic valve is normal in structure. Aortic valve regurgitation is not visualized. No aortic stenosis is present. 6. The inferior vena cava is normal in size with greater than 50% respiratory variability, suggesting right atrial pressure of 3 mmHg.  FINDINGS Left Ventricle: Sigmoid septum. Left ventricular ejection fraction, by estimation, is 60 to 65%. The left ventricle has normal function. The left ventricle has no regional wall motion abnormalities. The average left ventricular global longitudinal strain is -17.2 %. The global longitudinal strain is abnormal. The left ventricular internal cavity size was normal in size. There is no left ventricular hypertrophy. Left ventricular diastolic parameters are consistent with Grade I diastolic dysfunction (impaired relaxation).  Right Ventricle: The right ventricular size is normal. No increase in right ventricular wall thickness. Right ventricular systolic function is normal. There is normal pulmonary artery systolic pressure. The tricuspid regurgitant velocity is 2.30 m/s, and with an assumed right atrial pressure of 3 mmHg, the estimated right ventricular systolic pressure is 24.2 mmHg.  Left Atrium: Left atrial size was normal in size.  Right Atrium: Right atrial size was normal in size.  Pericardium: There is no evidence of pericardial effusion.  Mitral Valve: The mitral valve is normal in structure. No evidence of mitral valve regurgitation. No evidence of mitral valve stenosis.  Tricuspid Valve: The tricuspid valve is normal in structure. Tricuspid valve regurgitation is mild to moderate. No evidence of tricuspid stenosis.  Aortic Valve: The aortic valve is normal in structure. Aortic valve regurgitation is not visualized. No  aortic stenosis is present.  Pulmonic Valve: The pulmonic valve was normal in structure. Pulmonic valve regurgitation is trivial. No evidence of pulmonic stenosis.  Aorta: The aortic root is normal in size and structure.  Venous: The inferior vena cava is normal in size with greater than 50% respiratory variability, suggesting right atrial pressure of 3 mmHg.  IAS/Shunts: No atrial level shunt detected by color flow Doppler.   LEFT VENTRICLE PLAX 2D LVIDd:         4.10 cm   Diastology LVIDs:         2.60 cm   LV e' medial:    6.85 cm/s LV PW:         1.00 cm   LV E/e' medial:  10.6 LV IVS:        1.60 cm   LV e' lateral:   10.60 cm/s LVOT diam:     1.80 cm   LV E/e' lateral: 6.8 LVOT Area:     2.54 cm 2D Longitudinal Strain 2D Strain GLS Avg:     -17.2 %  RIGHT VENTRICLE             IVC RV Basal diam:  2.30 cm     IVC diam: 1.40 cm RV Mid diam:    1.50 cm RV S prime:     13.15 cm/s TAPSE (M-mode): 2.2 cm  LEFT ATRIUM             Index        RIGHT ATRIUM           Index LA diam:        3.80 cm 2.27  cm/m   RA Area:     12.00 cm LA Vol (A2C):   41.1 ml 24.55 ml/m  RA Volume:   26.20 ml  15.65 ml/m LA Vol (A4C):   53.9 ml 32.19 ml/m LA Biplane Vol: 48.1 ml 28.73 ml/m PULMONIC VALVE AORTA                 PR End Diast Vel: 4.49 msec Ao Root diam: 3.30 cm Ao Asc diam:  3.00 cm Ao Desc diam: 1.90 cm  MITRAL VALVE               TRICUSPID VALVE MV Area (PHT): 3.96 cm    TR Peak grad:   21.2 mmHg MV Decel Time: 192 msec    TR Vmax:        230.00 cm/s MV E velocity: 72.53 cm/s MV A velocity: 94.97 cm/s  SHUNTS MV E/A ratio:  0.76        Systemic Diam: 1.80 cm  Gypsy Balsam MD Electronically signed by Gypsy Balsam MD Signature Date/Time: 05/09/2023/8:50:36 AM    Final    MONITORS  LONG TERM MONITOR (3-14 DAYS) 11/24/2019  Narrative ZIO monitor was used for 3 days to assess palpitation in the setting of coronary artery disease.  The study initiated  11/06/2019.  The rhythm throughout was sinus with minimum, average and maximum heart rates of 46, 60 and 99 bpm.  There were no pauses of 3 seconds or greater or episodes of AV nodal or sinus node block.  Ventricular ectopy was rare with isolated PVCs  Supraventricular ectopy was rare with isolated APCs.  There was one 6 beat run of ectopic atrial rhythm noted asymptomatic.  There were no episodes of atrial fibrillation or flutter.  There are 11 triggered events 1 associated with rare atrial premature contraction.  There were 8 diary events of chest pain palpitation shortness of breath and lightheadedness 1 associated with rare atrial premature contraction   Conclusion unremarkable 3-day ZIO monitor triggered and diary events are generally unassociated with arrhythmia.            EKG:  EKG is not ordered today.   Recent Labs: 04/10/2023: NT-Pro BNP 498; TSH 4.410 05/08/2023: Magnesium 1.4 07/03/2023: ALT 14; BUN 21; Creatinine 1.51; Potassium 4.4; Sodium 128 09/04/2023: Hemoglobin 8.9; Platelets 193  Recent Lipid Panel    Component Value Date/Time   CHOL 145 10/10/2021 0938   TRIG 248 (H) 10/10/2021 0938   HDL 42 10/10/2021 0938   CHOLHDL 3.5 10/10/2021 0938   LDLCALC 63 10/10/2021 0938     Risk Assessment/Calculations:     Cardiac Studies & Procedures   CARDIAC CATHETERIZATION  CARDIAC CATHETERIZATION 10/18/2021  Narrative   Ost RCA to Prox RCA stent is 10% in-stent restenosis.   CULPRIT LESION: Prox RCA to Mid RCA lesion is 85% stenosed just beyond the stent.   After scoring balloon angioplasty, A drug-eluting stent was successfully placed (overlapping previous stent) using a SYNERGY XD 2.75X28.  Postdilated to 3.1 mm   Post intervention, there is a 5% residual stenosis at the most significant segment, however the remainder has 0% visual stenosis.Marland Kitchen   ------------------------------   Mid LAD to Dist LAD previously placed stent is 10% stenosed (actually focally at small  D2).   ------------------------------   The left ventricular systolic function is normal.  The left ventricular ejection fraction is 55-65% by visual estimate.   There is no aortic valve stenosis.  SUMMARY Two-vessel CAD: Widely patent LAD stent with  maybe 10 to 15% ISR, otherwise normal LCA system with small nondominant LCx Large dominant RCA with proximal stent that has distal edge leading into a native vessel 85% irregular stenosis.  (CULPRIT LESION) Successful Scoring Balloon Angioplasty followed by DES PCI with an overlapping DES stent (2.75 mm x 28 mm postdilated to 3.1 mm) Normal LVEF and EDP.   RECOMMENDATIONS Stable for Same-Day PCI. Is already on Plavix -> uninterrupted DAPT now for at least 6 months. Continue aggressive risk factor modification.  Follow-up with Dr. Dulce Sellar.    Bryan Lemma, MD  Findings Coronary Findings Diagnostic  Dominance: Right  Left Anterior Descending Mid LAD to Dist LAD lesion is 10% stenosed. The lesion is focal. The lesion was previously treated using a stent (unknown type) over 2 years ago. Previously placed stent displays restenosis. The stenosis was measured by a visual reading.  Second Diagonal Branch Vessel is small in size.  Third Diagonal Branch Vessel is small in size.  Left Circumflex Essentially courses almost to the ramus intermedius/high OM  Left Posterior Atrioventricular Artery Vessel is small in size.  Right Coronary Artery Vessel was injected. Vessel is normal in caliber. Ost RCA to Prox RCA lesion is 10% stenosed. The lesion was previously treated using a stent (unknown type) over 2 years ago. Previously placed stent displays restenosis. Prox RCA to Mid RCA lesion is 85% stenosed. The lesion is located proximal to the major branch, segmental and irregular. Tapers from 85% up to about 50%.  Acute Marginal Branch Vessel is small in size.  Right Ventricular Branch Vessel is small in size.  Right Posterior  Atrioventricular Artery Vessel is large in size.  First Right Posterolateral Branch Vessel is small in size.  Second Right Posterolateral Branch Vessel is moderate in size.  Intervention  Prox RCA to Mid RCA lesion Stent Lesion length:  24 mm. CATH VISTA GUIDE 6FR JR4 guide catheter was inserted. Lesion crossed with guidewire using a WIRE ASAHI PROWATER 180CM. Pre-stent angioplasty was performed using a BALLN SCOREFLEX 2.50X15. Maximum pressure:  14 atm. Inflation time: 30 sec. BALLN SAPPHIRE 2.0X15 -> 12 ATM, 20 sec A drug-eluting stent was successfully placed using a SYNERGY XD 2.75X28. Maximum pressure: 18 atm. Inflation time: 30 sec. Stent strut is well apposed. At the original 85% lesion site there are 2 focal areas where there is maybe 10% residual stenosis despite aggressive post dilation. Stent overlaps previously placed stent. Post-stent angioplasty was performed using a BALLN [place] EMERGE MR 3.0X20. Maximum pressure:  20 atm. Inflation time:  20 sec. Post-Intervention Lesion Assessment The intervention was successful. Pre-interventional TIMI flow is 3. Post-intervention TIMI flow is 3. Treated lesion length:  26 mm. No complications occurred at this lesion. There is a 5% residual stenosis post intervention.   STRESS TESTS  MYOCARDIAL PERFUSION IMAGING 05/26/2021  Narrative  The left ventricular ejection fraction is hyperdynamic (>65%).  Nuclear stress EF: 67%.  There was no ST segment deviation noted during stress.  No T wave inversion was noted during stress.  This is a low risk study.  The study is normal.   ECHOCARDIOGRAM  ECHOCARDIOGRAM COMPLETE 05/08/2023  Narrative ECHOCARDIOGRAM REPORT    Patient Name:   ALLYSSA WAYMENT [facility] Date of Exam: 05/08/2023 Medical Rec #:  [id number]                 Height:       63.0 in Accession #:    [id number]  Weight:       142.5 lb Date of Birth:  06-18-1946                BSA:          1.674 m Patient Age:     76 years                  BP:           110/60 mmHg Patient Gender: F                         HR:           64 bpm. Exam Location:  [place]  Procedure: 2D Echo, Cardiac Doppler, Color Doppler and Strain Analysis  Indications:    Coronary artery disease involving native coronary artery of native heart with angina pectoris (HCC) [I25.119 (ICD-10-CM)]; Essential hypertension [I10 (ICD-10-CM)]; Hyperlipidemia, unspecified hyperlipidemia type [E78.5 (ICD-10-CM)]; CKD (chronic kidney disease) stage 4, GFR 15-29 ml/min (HCC) [N18.4 (ICD-10-CM)]; Iron deficiency anemia, unspecified iron deficiency anemia type [D50.9 (ICD-10-CM)]; Mild tricuspid regurgitation by prior echocardiogram [I07.1 (ICD-10-CM)]; Other fatigue [R53.83 (ICD-10-CM)]; DOE (dyspnea on exertion) [R06.09 (ICD-10-CM)]  History:        Patient has prior history of Echocardiogram examinations, most recent 02/16/2019. CAD, Arrythmias:Atrial Fibrillation; Risk Factors:Dyslipidemia and Hypertension.  Sonographer:    Margreta Journey RDCS Referring Phys: [phone] Averyana Pillars C Casmir Auguste  IMPRESSIONS   1. Sigmoid septum. Left ventricular ejection fraction, by estimation, is 60 to 65%. The left ventricle has normal function. The left ventricle has no regional wall motion abnormalities. Left ventricular diastolic parameters are consistent with Grade I diastolic dysfunction (impaired relaxation). The average left ventricular global longitudinal strain is -17.2 %. The global longitudinal strain is abnormal. 2. Right ventricular systolic function is normal. The right ventricular size is normal. There is normal pulmonary artery systolic pressure. 3. The mitral valve is normal in structure. No evidence of mitral valve regurgitation. No evidence of mitral stenosis. 4. Tricuspid valve regurgitation is mild to moderate. 5. The aortic valve is normal in structure. Aortic valve regurgitation is not visualized. No aortic stenosis is present. 6. The  inferior vena cava is normal in size with greater than 50% respiratory variability, suggesting right atrial pressure of 3 mmHg.  FINDINGS Left Ventricle: Sigmoid septum. Left ventricular ejection fraction, by estimation, is 60 to 65%. The left ventricle has normal function. The left ventricle has no regional wall motion abnormalities. The average left ventricular global longitudinal strain is -17.2 %. The global longitudinal strain is abnormal. The left ventricular internal cavity size was normal in size. There is no left ventricular hypertrophy. Left ventricular diastolic parameters are consistent with Grade I diastolic dysfunction (impaired relaxation).  Right Ventricle: The right ventricular size is normal. No increase in right ventricular wall thickness. Right ventricular systolic function is normal. There is normal pulmonary artery systolic pressure. The tricuspid regurgitant velocity is 2.30 m/s, and with an assumed right atrial pressure of 3 mmHg, the estimated right ventricular systolic pressure is 24.2 mmHg.  Left Atrium: Left atrial size was normal in size.  Right Atrium: Right atrial size was normal in size.  Pericardium: There is no evidence of pericardial effusion.  Mitral Valve: The mitral valve is normal in structure. No evidence of mitral valve regurgitation. No evidence of mitral valve stenosis.  Tricuspid Valve: The tricuspid valve is normal in structure. Tricuspid valve regurgitation is mild to moderate. No evidence of tricuspid stenosis.  Aortic Valve: The aortic valve is normal in structure. Aortic valve regurgitation is not visualized. No aortic stenosis is present.  Pulmonic Valve: The pulmonic valve was normal in structure. Pulmonic valve regurgitation is trivial. No evidence of pulmonic stenosis.  Aorta: The aortic root is normal in size and structure.  Venous: The inferior vena cava is normal in size with greater than 50% respiratory variability, suggesting right  atrial pressure of 3 mmHg.  IAS/Shunts: No atrial level shunt detected by color flow Doppler.   LEFT VENTRICLE PLAX 2D LVIDd:         4.10 cm   Diastology LVIDs:         2.60 cm   LV e' medial:    6.85 cm/s LV PW:         1.00 cm   LV E/e' medial:  10.6 LV IVS:        1.60 cm   LV e' lateral:   10.60 cm/s LVOT diam:     1.80 cm   LV E/e' lateral: 6.8 LVOT Area:     2.54 cm 2D Longitudinal Strain 2D Strain GLS Avg:     -17.2 %  RIGHT VENTRICLE             IVC RV Basal diam:  2.30 cm     IVC diam: 1.40 cm RV Mid diam:    1.50 cm RV S prime:     13.15 cm/s TAPSE (M-mode): 2.2 cm  LEFT ATRIUM             Index        RIGHT ATRIUM           Index LA diam:        3.80 cm 2.27 cm/m   RA Area:     12.00 cm LA Vol (A2C):   41.1 ml 24.55 ml/m  RA Volume:   26.20 ml  15.65 ml/m LA Vol (A4C):   53.9 ml 32.19 ml/m LA Biplane Vol: 48.1 ml 28.73 ml/m PULMONIC VALVE AORTA                 PR End Diast Vel: 4.49 msec Ao Root diam: 3.30 cm Ao Asc diam:  3.00 cm Ao Desc diam: 1.90 cm  MITRAL VALVE               TRICUSPID VALVE MV Area (PHT): 3.96 cm    TR Peak grad:   21.2 mmHg MV Decel Time: 192 msec    TR Vmax:        230.00 cm/s MV E velocity: 72.53 cm/s MV A velocity: 94.97 cm/s  SHUNTS MV E/A ratio:  0.76        Systemic Diam: 1.80 cm  Gypsy Balsam MD Electronically signed by Gypsy Balsam MD Signature Date/Time: 05/09/2023/8:50:36 AM    Final    MONITORS  LONG TERM MONITOR (3-14 DAYS) 11/24/2019  Narrative ZIO monitor was used for 3 days to assess palpitation in the setting of coronary artery disease.  The study initiated 11/06/2019.  The rhythm throughout was sinus with minimum, average and maximum heart rates of 46, 60 and 99 bpm.  There were no pauses of 3 seconds or greater or episodes of AV nodal or sinus node block.  Ventricular ectopy was rare with isolated PVCs  Supraventricular ectopy was rare with isolated APCs.  There was one 6 beat run of  ectopic atrial rhythm noted asymptomatic.  There were no episodes of atrial fibrillation or flutter.  There are 11 triggered events 1 associated with rare atrial premature contraction.  There were 8 diary events of chest pain palpitation shortness of breath and lightheadedness 1 associated with rare atrial premature contraction   Conclusion unremarkable 3-day ZIO monitor triggered and diary events are generally unassociated with arrhythmia.                     Physical Exam:    VS:  BP 130/76 (BP Location: Right Arm, Patient Position: Sitting, Cuff Size: Normal)   Pulse 67   Ht 5\' 3"  (1.6 m)   Wt 134 lb (60.8 kg)   SpO2 96%   BMI 23.74 kg/m     Wt Readings from Last 3 Encounters:  09/17/23 134 lb (60.8 kg)  09/04/23 138 lb 0.6 oz (62.6 kg)  08/07/23 136 lb 1.3 oz (61.7 kg)     GEN:  Well nourished, well developed in no acute distress HEENT: Normal NECK: No JVD; No carotid bruits LYMPHATICS: No lymphadenopathy CARDIAC: RRR, + murmur 2/6 LSB, rubs, gallops RESPIRATORY:  Clear to auscultation without rales, wheezing or rhonchi  ABDOMEN: Soft, non-tender, non-distended MUSCULOSKELETAL:  No edema; No deformity  SKIN: Warm and dry NEUROLOGIC:  Alert and oriented x 3 PSYCHIATRIC:  Normal affect   ASSESSMENT:    1. Coronary artery disease involving native coronary artery of native heart with angina pectoris (HCC)   2. Essential hypertension   3. Hyperlipidemia, unspecified hyperlipidemia type   4. Mild tricuspid regurgitation by prior echocardiogram   5. CKD (chronic kidney disease) stage 4, GFR 15-29 ml/min (HCC)   6. Iron deficiency anemia, unspecified iron deficiency anemia type      PLAN:    In order of problems listed above:  CAD - s/p PCI and DES to LAD and RCA in 2003; most recent LHC in 2022 revealed two-vessel CAD s/p DES PCI with an overlapping DES stent to RCA. Stable with no anginal symptoms. No indication for ischemic evaluation.  Continue carvedilol 6.25  mg twice a day, Plavix 75 mg daily, Imdur 30 mg daily, pravastatin 40 mg daily, nitroglycerin as needed--has not needed.  Hypertension -blood pressure today is controlled at 130/ 76.she has been keeping a blood pressure log, blood pressure readings have been labile, she has also been dehydrated and had intermittent fevers.  Previously we advised her to hold her valsartan however her blood pressure spiked after this.  Continue carvedilol 6.25 mg twice a day, valsartan 80 mg once a day, continue chlorthalidone 25 mg daily.  Intermittent fevers-this has been occurring since July, she has noticed that it typically starts a few days after her infusion, she wonders if there could be some correlation.  She has also had an unintentional weight loss of approximately 15 pounds.  She is following closely with her PCP, she has had significant workup including chest x-ray, which is all been unrevealing.  Encouraged her to follow-up with her hematologist.  Fatigue/DOE -likely multifactorial related to her chronic anemia, repeat echo was unrevealing as was other lab work.  CBC is followed by oncology for anemia.  Hyponatremia-this was discovered by her PCP, she was advised to liberalize her salt intake.  Will repeat BMET today.  Murmur/moderate TR -moderate TR per echo in May of this year.  HLD -LDL on 10/10/2021 was 63, continue pravastatin 40 mg daily.   CKD with anemia -follows with the cancer for infusions, goal is hemoglobin greater than 10.  She will see Dr. Melvyn Neth on October 8,  encouraged her to follow-up with his office as her symptoms seem to be occurring after she receives her infusions.     Disposition-keep follow-up with Dr. Dulce Sellar, repeat BMET today.      Medication Adjustments/Labs and Tests Ordered: Current medicines are reviewed at length with the patient today.  Concerns regarding medicines are outlined above.  Orders Placed This Encounter  Procedures   Basic Metabolic Panel (BMET)   EKG  12-Lead   No orders of the defined types were placed in this encounter.   Patient Instructions  Medication Instructions:   *If you need a refill on your cardiac medications before your next appointment, please call your pharmacy*    Lab Work:  BMET today   If you have labs (blood work) drawn today and your tests are completely normal, you will receive your results only by: MyChart Message (if you have MyChart) OR A paper copy in the mail If you have any lab test that is abnormal or we need to change your treatment, we will call you to review the results.   Testing/Procedures:    Follow-Up: At [facility], you and your health needs are our priority.  As part of our continuing mission to provide you with exceptional heart care, we have created designated Provider Care Teams.  These Care Teams include your primary Cardiologist (physician) and Advanced Practice Providers (APPs -  Physician Assistants and Nurse Practitioners) who all work together to provide you with the care you need, when you need it.  We recommend signing up for the patient portal called "MyChart".  Sign up information is provided on this After Visit Summary.  MyChart is used to connect with patients for Virtual Visits (Telemedicine).  Patients are able to view lab/test results, encounter notes, upcoming appointments, etc.  Non-urgent messages can be sent to your provider as well.   To learn more about what you can do with MyChart, go to [email or web address].    Your next appointment:    We will reach out to you in January to schedule appointment.  Provider:   Norman Herrlich, MD    Other Instructions  Keep well hydrated.     Signed, [name], NP  09/17/2023 9:02 AM    [facility]

## 2023-09-17 ENCOUNTER — Encounter: Payer: Self-pay | Admitting: Cardiology

## 2023-09-17 ENCOUNTER — Telehealth: Payer: Self-pay

## 2023-09-17 ENCOUNTER — Ambulatory Visit: Payer: Medicare Other | Attending: Cardiology | Admitting: Cardiology

## 2023-09-17 ENCOUNTER — Encounter: Payer: Self-pay | Admitting: Oncology

## 2023-09-17 VITALS — BP 130/76 | HR 67 | Ht 63.0 in | Wt 134.0 lb

## 2023-09-17 DIAGNOSIS — I10 Essential (primary) hypertension: Secondary | ICD-10-CM | POA: Diagnosis present

## 2023-09-17 DIAGNOSIS — I071 Rheumatic tricuspid insufficiency: Secondary | ICD-10-CM

## 2023-09-17 DIAGNOSIS — I25119 Atherosclerotic heart disease of native coronary artery with unspecified angina pectoris: Secondary | ICD-10-CM

## 2023-09-17 DIAGNOSIS — D509 Iron deficiency anemia, unspecified: Secondary | ICD-10-CM | POA: Diagnosis present

## 2023-09-17 DIAGNOSIS — E785 Hyperlipidemia, unspecified: Secondary | ICD-10-CM | POA: Diagnosis present

## 2023-09-17 DIAGNOSIS — E871 Hypo-osmolality and hyponatremia: Secondary | ICD-10-CM

## 2023-09-17 DIAGNOSIS — R509 Fever, unspecified: Secondary | ICD-10-CM

## 2023-09-17 DIAGNOSIS — N184 Chronic kidney disease, stage 4 (severe): Secondary | ICD-10-CM

## 2023-09-17 DIAGNOSIS — Z1231 Encounter for screening mammogram for malignant neoplasm of breast: Secondary | ICD-10-CM | POA: Diagnosis not present

## 2023-09-17 NOTE — Patient Instructions (Signed)
Medication Instructions:   *If you need a refill on your cardiac medications before your next appointment, please call your pharmacy*    Lab Work:  BMET today   If you have labs (blood work) drawn today and your tests are completely normal, you will receive your results only by: MyChart Message (if you have MyChart) OR A paper copy in the mail If you have any lab test that is abnormal or we need to change your treatment, we will call you to review the results.   Testing/Procedures:    Follow-Up: At [facility], you and your health needs are our priority.  As part of our continuing mission to provide you with exceptional heart care, we have created designated Provider Care Teams.  These Care Teams include your primary Cardiologist (physician) and Advanced Practice Providers (APPs -  Physician Assistants and Nurse Practitioners) who all work together to provide you with the care you need, when you need it.  We recommend signing up for the patient portal called "MyChart".  Sign up information is provided on this After Visit Summary.  MyChart is used to connect with patients for Virtual Visits (Telemedicine).  Patients are able to view lab/test results, encounter notes, upcoming appointments, etc.  Non-urgent messages can be sent to your provider as well.   To learn more about what you can do with MyChart, go to [email or web address].    Your next appointment:    We will reach out to you in January to schedule appointment.  Provider:   Norman Herrlich, MD    Other Instructions  Keep well hydrated.

## 2023-09-17 NOTE — Telephone Encounter (Addendum)
Pt called and states, "since Dr Melvyn Neth increased my shot in July, I get a fever, vomiting and can't eat the week after. I have lost 15#. I went to the emergency room @ [facility] in July because I was sick the week after. They gave me fluids but couldn't find anything else. I don't know what else it could be".   Fannie Knee Phy,RPH: Possible but we don't hear about these much. I'm sure Dr. Melvyn Neth would be willing to go back to the 20,000 if that is what the patient prefers. Would she be ok with that?

## 2023-09-18 ENCOUNTER — Telehealth: Payer: Self-pay | Admitting: *Deleted

## 2023-09-18 LAB — BASIC METABOLIC PANEL WITH GFR
BUN/Creatinine Ratio: 17 (ref 12–28)
BUN: 20 mg/dL (ref 8–27)
CO2: 27 mmol/L (ref 20–29)
Calcium: 10.2 mg/dL (ref 8.7–10.3)
Chloride: 91 mmol/L — ABNORMAL LOW (ref 96–106)
Creatinine, Ser: 1.2 mg/dL — ABNORMAL HIGH (ref 0.57–1.00)
Glucose: 102 mg/dL — ABNORMAL HIGH (ref 70–99)
Potassium: 4.7 mmol/L (ref 3.5–5.2)
Sodium: 131 mmol/L — ABNORMAL LOW (ref 134–144)
eGFR: 47 mL/min/1.73 — ABNORMAL LOW

## 2023-09-18 NOTE — Telephone Encounter (Signed)
Informed pt of lab results. Pt verbalized understanding and had no further questions.

## 2023-09-18 NOTE — Telephone Encounter (Signed)
-----   Message from [name] sent at 09/18/2023 11:40 AM EDT ----- Chemistry looks good. Sodium is low, but this is baseline and actually better than normal. Kidney function is improved.

## 2023-09-30 NOTE — Progress Notes (Unsigned)
[facility]  [address] [place],  [place]  [address] [phone]  Clinic Day:  07/03/2023  Referring physician: Marylen Ponto, MD   HISTORY OF PRESENT ILLNESS:  The patient is a 77 y.o. female with anemia secondary to chronic renal insufficiency.   She also has had issues with iron deficiency anemia to where IV iron was given in 2023.  She receives monthly Retacrit injections to get her hemoglobin to/above 10.  She comes in today to reassess her anemia.  Since her last visit, the patient has been doing fairly well.  She has had episodes of fatigue, but denies having any overt forms of blood loss which concern her for progressive anemia.  PHYSICAL EXAM:  There were no vitals taken for this visit. Wt Readings from Last 3 Encounters:  09/17/23 134 lb (60.8 kg)  09/04/23 138 lb 0.6 oz (62.6 kg)  08/07/23 136 lb 1.3 oz (61.7 kg)   There is no height or weight on file to calculate BMI. Performance status (ECOG): 1 - Symptomatic but completely ambulatory Physical Exam Constitutional:      Appearance: Normal appearance. She is not ill-appearing.  HENT:     Mouth/Throat:     Mouth: Mucous membranes are moist.     Pharynx: Oropharynx is clear. No oropharyngeal exudate or posterior oropharyngeal erythema.  Cardiovascular:     Rate and Rhythm: Normal rate and regular rhythm.     Heart sounds: No murmur heard.    No friction rub. No gallop.  Pulmonary:     Effort: Pulmonary effort is normal. No respiratory distress.     Breath sounds: Normal breath sounds. No wheezing, rhonchi or rales.  Abdominal:     General: Bowel sounds are normal. There is no distension.     Palpations: Abdomen is soft. There is no mass.     Tenderness: There is no abdominal tenderness.  Musculoskeletal:        General: No swelling.     Right lower leg: No edema.     Left lower leg: No edema.  Lymphadenopathy:     Cervical: No cervical adenopathy.     Upper Body:     Right  upper body: No supraclavicular or axillary adenopathy.     Left upper body: No supraclavicular or axillary adenopathy.     Lower Body: No right inguinal adenopathy. No left inguinal adenopathy.  Skin:    General: Skin is warm.     Coloration: Skin is not jaundiced.     Findings: No lesion or rash.  Neurological:     General: No focal deficit present.     Mental Status: She is alert and oriented to person, place, and time. Mental status is at baseline.  Psychiatric:        Mood and Affect: Mood normal.        Behavior: Behavior normal.        Thought Content: Thought content normal.   LABS:    Latest Reference Range & Units 07/03/23 08:29  Sodium 135 - 145 mmol/L 128 (L)  Potassium 3.5 - 5.1 mmol/L 4.4  Chloride 98 - 111 mmol/L 93 (L)  CO2 22 - 32 mmol/L 26  Glucose 70 - 99 mg/dL 564 (H)  BUN 8 - 23 mg/dL 21  Creatinine 3.32 - 9.51 mg/dL 8.84 (H)  Calcium 8.9 - 10.3 mg/dL 9.3  Anion gap 5 - 15  9  Alkaline Phosphatase 38 - 126 U/L 70  Albumin 3.5 -  5.0 g/dL 4.1  AST 15 - 41 U/L 20  ALT 0 - 44 U/L 14  Total Protein 6.5 - 8.1 g/dL 7.4  Total Bilirubin 0.3 - 1.2 mg/dL 0.4  GFR, Est Non African American >60 mL/min 36 (L)  (L): Data is abnormally low (H): Data is abnormally high   Latest Reference Range & Units 07/03/23 08:29 07/03/23 11:19  Iron 28 - 170 ug/dL 59   UIBC ug/dL 161   TIBC 096 - 045 ug/dL 409   Saturation Ratios 10.4 - 31.8 % 19   Ferritin 11 - 307 ng/mL 193   Folate >5.9 ng/mL  11.0  Vitamin B12 180 - 914 pg/mL  496   ASSESSMENT & PLAN:  Assessment/Plan:  A 77 y.o. female with anemia secondary to chronic renal insufficiency.  Unfortunately, her hemoglobin remains significantly less than 10.  Her iron, vitamin B12 and folate levels all remain normal.  Based upon this, I will increase her Retacrit shots back to 40,000 monthly.  Her CBC will be checked monthly; I will see her back in 3 months for repeat clinical assessment.  If her hemoglobin shows no  improvement, I may need to consider a 2nd bone marrow biopsy with this patient.  The patient understands all the plans discussed today and is in agreement with them.    Geron Mulford Kirby Funk, MD

## 2023-10-01 ENCOUNTER — Inpatient Hospital Stay: Payer: Medicare Other | Attending: Oncology

## 2023-10-01 ENCOUNTER — Inpatient Hospital Stay (INDEPENDENT_AMBULATORY_CARE_PROVIDER_SITE_OTHER): Payer: Medicare Other | Admitting: Oncology

## 2023-10-01 ENCOUNTER — Other Ambulatory Visit: Payer: Self-pay | Admitting: Pharmacist

## 2023-10-01 ENCOUNTER — Encounter: Payer: Self-pay | Admitting: Oncology

## 2023-10-01 ENCOUNTER — Telehealth: Payer: Self-pay

## 2023-10-01 VITALS — BP 179/77 | HR 63 | Temp 99.0°F | Resp 14 | Ht 63.0 in | Wt 136.1 lb

## 2023-10-01 DIAGNOSIS — N183 Chronic kidney disease, stage 3 unspecified: Secondary | ICD-10-CM | POA: Insufficient documentation

## 2023-10-01 DIAGNOSIS — D509 Iron deficiency anemia, unspecified: Secondary | ICD-10-CM | POA: Insufficient documentation

## 2023-10-01 DIAGNOSIS — Z79899 Other long term (current) drug therapy: Secondary | ICD-10-CM | POA: Diagnosis not present

## 2023-10-01 DIAGNOSIS — D649 Anemia, unspecified: Secondary | ICD-10-CM | POA: Diagnosis not present

## 2023-10-01 DIAGNOSIS — D508 Other iron deficiency anemias: Secondary | ICD-10-CM | POA: Diagnosis not present

## 2023-10-01 DIAGNOSIS — D631 Anemia in chronic kidney disease: Secondary | ICD-10-CM | POA: Insufficient documentation

## 2023-10-01 DIAGNOSIS — N189 Chronic kidney disease, unspecified: Secondary | ICD-10-CM | POA: Diagnosis not present

## 2023-10-01 HISTORY — DX: Iron deficiency anemia, unspecified: D50.9

## 2023-10-01 LAB — VITAMIN B12: Vitamin B-12: 360 pg/mL (ref 180–914)

## 2023-10-01 LAB — IRON AND TIBC
Iron: 31 ug/dL (ref 28–170)
Saturation Ratios: 9 % — ABNORMAL LOW (ref 10.4–31.8)
TIBC: 333 ug/dL (ref 250–450)
UIBC: 302 ug/dL

## 2023-10-01 LAB — CMP (CANCER CENTER ONLY)
ALT: 14 U/L (ref 0–44)
AST: 15 U/L (ref 15–41)
Albumin: 4.1 g/dL (ref 3.5–5.0)
Alkaline Phosphatase: 75 U/L (ref 38–126)
Anion gap: 12 (ref 5–15)
BUN: 28 mg/dL — ABNORMAL HIGH (ref 8–23)
CO2: 26 mmol/L (ref 22–32)
Calcium: 9.7 mg/dL (ref 8.9–10.3)
Chloride: 93 mmol/L — ABNORMAL LOW (ref 98–111)
Creatinine: 1.31 mg/dL — ABNORMAL HIGH (ref 0.44–1.00)
GFR, Estimated: 42 mL/min — ABNORMAL LOW (ref >60)
Glucose, Bld: 149 mg/dL — ABNORMAL HIGH (ref 70–99)
Potassium: 4.3 mmol/L (ref 3.5–5.1)
Sodium: 131 mmol/L — ABNORMAL LOW (ref 135–145)
Total Bilirubin: 0.7 mg/dL (ref 0.3–1.2)
Total Protein: 7.8 g/dL (ref 6.5–8.1)

## 2023-10-01 LAB — CBC AND DIFFERENTIAL
HCT: 26 — AB (ref 36–46)
Hemoglobin: 8.8 — AB (ref 12.0–16.0)
Neutrophils Absolute: 6.14
Platelets: 219 10*3/uL (ref 150–400)
WBC: 8.3

## 2023-10-01 LAB — FERRITIN: Ferritin: 151 ng/mL (ref 11–307)

## 2023-10-01 LAB — CBC: RBC: 2.95 — AB (ref 3.87–5.11)

## 2023-10-01 LAB — FOLATE: Folate: 14.6 ng/mL (ref >5.9)

## 2023-10-01 NOTE — Telephone Encounter (Signed)
Dr Melvyn Neth reviewed pt's labs & recommends another round of IV iron.   Latest Reference Range & Units 10/01/23 08:50  Iron 28 - 170 ug/dL 31  UIBC ug/dL 578  TIBC 469 - 629 ug/dL 528  Saturation Ratios 10.4 - 31.8 % 9 (L)  Ferritin 11 - 307 ng/mL 151  Folate >5.9 ng/mL 14.6  Vitamin B12 180 - 914 pg/mL 360  (L): Data is abnormally low

## 2023-10-02 ENCOUNTER — Telehealth: Payer: Self-pay | Admitting: Oncology

## 2023-10-02 ENCOUNTER — Inpatient Hospital Stay: Payer: Medicare Other

## 2023-10-02 NOTE — Telephone Encounter (Signed)
Patient has been scheduled. Aware of appt date and time.   Scheduling Message Entered by Rennis Harding A on 10/01/2023 at  3:31 PM Priority: Routine <No visit type provided>  Department: [facility]  Provider:  Scheduling Notes:  IV iron asap  Labs/appt 12-03-23

## 2023-10-10 MED FILL — Iron Sucrose Inj 20 MG/ML (Fe Equiv): INTRAVENOUS | Qty: 10 | Status: AC

## 2023-10-11 ENCOUNTER — Inpatient Hospital Stay: Payer: Medicare Other

## 2023-10-11 ENCOUNTER — Encounter: Payer: Self-pay | Admitting: Oncology

## 2023-10-11 VITALS — BP 155/74 | HR 66 | Temp 98.0°F | Resp 16 | Ht 63.0 in | Wt 136.0 lb

## 2023-10-11 DIAGNOSIS — D631 Anemia in chronic kidney disease: Secondary | ICD-10-CM | POA: Diagnosis not present

## 2023-10-11 DIAGNOSIS — N183 Chronic kidney disease, stage 3 unspecified: Secondary | ICD-10-CM | POA: Diagnosis not present

## 2023-10-11 DIAGNOSIS — Z79899 Other long term (current) drug therapy: Secondary | ICD-10-CM | POA: Diagnosis not present

## 2023-10-11 MED ORDER — SODIUM CHLORIDE 0.9 % IV SOLN
200.0000 mg | Freq: Once | INTRAVENOUS | Status: AC
  Administered 2023-10-11: 200 mg via INTRAVENOUS
  Filled 2023-10-11: qty 200

## 2023-10-11 MED ORDER — SODIUM CHLORIDE 0.9 % IV SOLN: Freq: Once | INTRAVENOUS | Status: AC

## 2023-10-11 MED FILL — Iron Sucrose Inj 20 MG/ML (Fe Equiv): INTRAVENOUS | Qty: 10 | Status: AC

## 2023-10-11 NOTE — Patient Instructions (Signed)
Iron Sucrose Injection What is this medication? IRON SUCROSE (EYE ern SOO krose) treats low levels of iron (iron deficiency anemia) in people with kidney disease. Iron is a mineral that plays an important role in making red blood cells, which carry oxygen from your lungs to the rest of your body. This medicine may be used for other purposes; ask your health care provider or pharmacist if you have questions. COMMON BRAND NAME(S): Venofer What should I tell my care team before I take this medication? They need to know if you have any of these conditions: Anemia not caused by low iron levels Heart disease High levels of iron in the blood Kidney disease Liver disease An unusual or allergic reaction to iron, other medications, foods, dyes, or preservatives Pregnant or trying to get pregnant Breastfeeding How should I use this medication? This medication is for infusion into a vein. It is given in a hospital or clinic setting. Talk to your care team about the use of this medication in children. While this medication may be prescribed for children as young as 2 years for selected conditions, precautions do apply. Overdosage: If you think you have taken too much of this medicine contact a poison control center or emergency room at once. NOTE: This medicine is only for you. Do not share this medicine with others. What if I miss a dose? Keep appointments for follow-up doses. It is important not to miss your dose. Call your care team if you are unable to keep an appointment. What may interact with this medication? Do not take this medication with any of the following: Deferoxamine Dimercaprol Other iron products This medication may also interact with the following: Chloramphenicol Deferasirox This list may not describe all possible interactions. Give your health care provider a list of all the medicines, herbs, non-prescription drugs, or dietary supplements you use. Also tell them if you smoke,  drink alcohol, or use illegal drugs. Some items may interact with your medicine. What should I watch for while using this medication? Visit your care team regularly. Tell your care team if your symptoms do not start to get better or if they get worse. You may need blood work done while you are taking this medication. You may need to follow a special diet. Talk to your care team. Foods that contain iron include: whole grains/cereals, dried fruits, beans, or peas, leafy green vegetables, and organ meats (liver, kidney). What side effects may I notice from receiving this medication? Side effects that you should report to your care team as soon as possible: Allergic reactions--skin rash, itching, hives, swelling of the face, lips, tongue, or throat Low blood pressure--dizziness, feeling faint or lightheaded, blurry vision Shortness of breath Side effects that usually do not require medical attention (report to your care team if they continue or are bothersome): Flushing Headache Joint pain Muscle pain Nausea Pain, redness, or irritation at injection site This list may not describe all possible side effects. Call your doctor for medical advice about side effects. You may report side effects to FDA at 1-800-FDA-1088. Where should I keep my medication? This medication is given in a hospital or clinic. It will not be stored at home. NOTE: This sheet is a summary. It may not cover all possible information. If you have questions about this medicine, talk to your doctor, pharmacist, or health care provider.  2024 Elsevier/Gold Standard (2023-05-18 00:00:00)

## 2023-10-12 ENCOUNTER — Inpatient Hospital Stay: Payer: Medicare Other

## 2023-10-12 VITALS — BP 135/58 | HR 63 | Temp 98.3°F | Resp 18

## 2023-10-12 DIAGNOSIS — N183 Chronic kidney disease, stage 3 unspecified: Secondary | ICD-10-CM

## 2023-10-12 DIAGNOSIS — Z79899 Other long term (current) drug therapy: Secondary | ICD-10-CM | POA: Diagnosis not present

## 2023-10-12 DIAGNOSIS — D631 Anemia in chronic kidney disease: Secondary | ICD-10-CM | POA: Diagnosis not present

## 2023-10-12 MED ORDER — SODIUM CHLORIDE 0.9 % IV SOLN
200.0000 mg | Freq: Once | INTRAVENOUS | Status: AC
  Administered 2023-10-12: 200 mg via INTRAVENOUS
  Filled 2023-10-12: qty 200

## 2023-10-12 MED ORDER — SODIUM CHLORIDE 0.9 % IV SOLN: Freq: Once | INTRAVENOUS | Status: AC

## 2023-10-12 NOTE — Patient Instructions (Signed)
Iron Sucrose Injection What is this medication? IRON SUCROSE (EYE ern SOO krose) treats low levels of iron (iron deficiency anemia) in people with kidney disease. Iron is a mineral that plays an important role in making red blood cells, which carry oxygen from your lungs to the rest of your body. This medicine may be used for other purposes; ask your health care provider or pharmacist if you have questions. COMMON BRAND NAME(S): Venofer What should I tell my care team before I take this medication? They need to know if you have any of these conditions: Anemia not caused by low iron levels Heart disease High levels of iron in the blood Kidney disease Liver disease An unusual or allergic reaction to iron, other medications, foods, dyes, or preservatives Pregnant or trying to get pregnant Breastfeeding How should I use this medication? This medication is for infusion into a vein. It is given in a hospital or clinic setting. Talk to your care team about the use of this medication in children. While this medication may be prescribed for children as young as 2 years for selected conditions, precautions do apply. Overdosage: If you think you have taken too much of this medicine contact a poison control center or emergency room at once. NOTE: This medicine is only for you. Do not share this medicine with others. What if I miss a dose? Keep appointments for follow-up doses. It is important not to miss your dose. Call your care team if you are unable to keep an appointment. What may interact with this medication? Do not take this medication with any of the following: Deferoxamine Dimercaprol Other iron products This medication may also interact with the following: Chloramphenicol Deferasirox This list may not describe all possible interactions. Give your health care provider a list of all the medicines, herbs, non-prescription drugs, or dietary supplements you use. Also tell them if you smoke,  drink alcohol, or use illegal drugs. Some items may interact with your medicine. What should I watch for while using this medication? Visit your care team regularly. Tell your care team if your symptoms do not start to get better or if they get worse. You may need blood work done while you are taking this medication. You may need to follow a special diet. Talk to your care team. Foods that contain iron include: whole grains/cereals, dried fruits, beans, or peas, leafy green vegetables, and organ meats (liver, kidney). What side effects may I notice from receiving this medication? Side effects that you should report to your care team as soon as possible: Allergic reactions--skin rash, itching, hives, swelling of the face, lips, tongue, or throat Low blood pressure--dizziness, feeling faint or lightheaded, blurry vision Shortness of breath Side effects that usually do not require medical attention (report to your care team if they continue or are bothersome): Flushing Headache Joint pain Muscle pain Nausea Pain, redness, or irritation at injection site This list may not describe all possible side effects. Call your doctor for medical advice about side effects. You may report side effects to FDA at 1-800-FDA-1088. Where should I keep my medication? This medication is given in a hospital or clinic. It will not be stored at home. NOTE: This sheet is a summary. It may not cover all possible information. If you have questions about this medicine, talk to your doctor, pharmacist, or health care provider.  2024 Elsevier/Gold Standard (2023-05-18 00:00:00)

## 2023-10-15 ENCOUNTER — Other Ambulatory Visit: Payer: Self-pay | Admitting: Cardiology

## 2023-10-15 ENCOUNTER — Encounter: Payer: Self-pay | Admitting: Oncology

## 2023-10-16 ENCOUNTER — Encounter: Payer: Self-pay | Admitting: Oncology

## 2023-10-16 ENCOUNTER — Inpatient Hospital Stay: Payer: Medicare Other

## 2023-10-16 VITALS — BP 140/65 | HR 68 | Temp 98.1°F | Resp 20 | Ht 63.0 in | Wt 129.1 lb

## 2023-10-16 DIAGNOSIS — D631 Anemia in chronic kidney disease: Secondary | ICD-10-CM | POA: Diagnosis not present

## 2023-10-16 DIAGNOSIS — R918 Other nonspecific abnormal finding of lung field: Secondary | ICD-10-CM | POA: Diagnosis not present

## 2023-10-16 DIAGNOSIS — N183 Chronic kidney disease, stage 3 unspecified: Secondary | ICD-10-CM | POA: Diagnosis not present

## 2023-10-16 DIAGNOSIS — T753XXA Motion sickness, initial encounter: Secondary | ICD-10-CM | POA: Diagnosis not present

## 2023-10-16 DIAGNOSIS — G43909 Migraine, unspecified, not intractable, without status migrainosus: Secondary | ICD-10-CM | POA: Diagnosis not present

## 2023-10-16 DIAGNOSIS — Z79899 Other long term (current) drug therapy: Secondary | ICD-10-CM | POA: Diagnosis not present

## 2023-10-16 DIAGNOSIS — Z6824 Body mass index (BMI) 24.0-24.9, adult: Secondary | ICD-10-CM | POA: Diagnosis not present

## 2023-10-16 MED ORDER — SODIUM CHLORIDE 0.9 % IV SOLN: Freq: Once | INTRAVENOUS | Status: AC

## 2023-10-16 MED ORDER — IRON SUCROSE 20 MG/ML IV SOLN
200.0000 mg | INTRAVENOUS | Status: DC
  Administered 2023-10-16: 200 mg via INTRAVENOUS

## 2023-10-16 NOTE — Patient Instructions (Signed)
Iron Sucrose Injection What is this medication? IRON SUCROSE (EYE ern SOO krose) treats low levels of iron (iron deficiency anemia) in people with kidney disease. Iron is a mineral that plays an important role in making red blood cells, which carry oxygen from your lungs to the rest of your body. This medicine may be used for other purposes; ask your health care provider or pharmacist if you have questions. COMMON BRAND NAME(S): Venofer What should I tell my care team before I take this medication? They need to know if you have any of these conditions: Anemia not caused by low iron levels Heart disease High levels of iron in the blood Kidney disease Liver disease An unusual or allergic reaction to iron, other medications, foods, dyes, or preservatives Pregnant or trying to get pregnant Breastfeeding How should I use this medication? This medication is for infusion into a vein. It is given in a hospital or clinic setting. Talk to your care team about the use of this medication in children. While this medication may be prescribed for children as young as 2 years for selected conditions, precautions do apply. Overdosage: If you think you have taken too much of this medicine contact a poison control center or emergency room at once. NOTE: This medicine is only for you. Do not share this medicine with others. What if I miss a dose? Keep appointments for follow-up doses. It is important not to miss your dose. Call your care team if you are unable to keep an appointment. What may interact with this medication? Do not take this medication with any of the following: Deferoxamine Dimercaprol Other iron products This medication may also interact with the following: Chloramphenicol Deferasirox This list may not describe all possible interactions. Give your health care provider a list of all the medicines, herbs, non-prescription drugs, or dietary supplements you use. Also tell them if you smoke,  drink alcohol, or use illegal drugs. Some items may interact with your medicine. What should I watch for while using this medication? Visit your care team regularly. Tell your care team if your symptoms do not start to get better or if they get worse. You may need blood work done while you are taking this medication. You may need to follow a special diet. Talk to your care team. Foods that contain iron include: whole grains/cereals, dried fruits, beans, or peas, leafy green vegetables, and organ meats (liver, kidney). What side effects may I notice from receiving this medication? Side effects that you should report to your care team as soon as possible: Allergic reactions--skin rash, itching, hives, swelling of the face, lips, tongue, or throat Low blood pressure--dizziness, feeling faint or lightheaded, blurry vision Shortness of breath Side effects that usually do not require medical attention (report to your care team if they continue or are bothersome): Flushing Headache Joint pain Muscle pain Nausea Pain, redness, or irritation at injection site This list may not describe all possible side effects. Call your doctor for medical advice about side effects. You may report side effects to FDA at 1-800-FDA-1088. Where should I keep my medication? This medication is given in a hospital or clinic. It will not be stored at home. NOTE: This sheet is a summary. It may not cover all possible information. If you have questions about this medicine, talk to your doctor, pharmacist, or health care provider.  2024 Elsevier/Gold Standard (2023-05-18 00:00:00)

## 2023-10-18 ENCOUNTER — Inpatient Hospital Stay: Payer: Medicare Other

## 2023-10-18 VITALS — BP 100/50 | HR 64 | Temp 97.4°F | Resp 16 | Ht 63.0 in | Wt 132.0 lb

## 2023-10-18 DIAGNOSIS — Z79899 Other long term (current) drug therapy: Secondary | ICD-10-CM | POA: Diagnosis not present

## 2023-10-18 DIAGNOSIS — D631 Anemia in chronic kidney disease: Secondary | ICD-10-CM | POA: Diagnosis not present

## 2023-10-18 DIAGNOSIS — N183 Chronic kidney disease, stage 3 unspecified: Secondary | ICD-10-CM

## 2023-10-18 MED ORDER — SODIUM CHLORIDE 0.9 % IV SOLN: Freq: Once | INTRAVENOUS | Status: AC

## 2023-10-18 MED ORDER — IRON SUCROSE 20 MG/ML IV SOLN
200.0000 mg | Freq: Once | INTRAVENOUS | Status: AC
Start: 2023-10-18 — End: 2023-10-18
  Administered 2023-10-18: 200 mg via INTRAVENOUS

## 2023-10-18 NOTE — Patient Instructions (Signed)
Iron Sucrose Injection What is this medication? IRON SUCROSE (EYE ern SOO krose) treats low levels of iron (iron deficiency anemia) in people with kidney disease. Iron is a mineral that plays an important role in making red blood cells, which carry oxygen from your lungs to the rest of your body. This medicine may be used for other purposes; ask your health care provider or pharmacist if you have questions. COMMON BRAND NAME(S): Venofer What should I tell my care team before I take this medication? They need to know if you have any of these conditions: Anemia not caused by low iron levels Heart disease High levels of iron in the blood Kidney disease Liver disease An unusual or allergic reaction to iron, other medications, foods, dyes, or preservatives Pregnant or trying to get pregnant Breastfeeding How should I use this medication? This medication is for infusion into a vein. It is given in a hospital or clinic setting. Talk to your care team about the use of this medication in children. While this medication may be prescribed for children as young as 2 years for selected conditions, precautions do apply. Overdosage: If you think you have taken too much of this medicine contact a poison control center or emergency room at once. NOTE: This medicine is only for you. Do not share this medicine with others. What if I miss a dose? Keep appointments for follow-up doses. It is important not to miss your dose. Call your care team if you are unable to keep an appointment. What may interact with this medication? Do not take this medication with any of the following: Deferoxamine Dimercaprol Other iron products This medication may also interact with the following: Chloramphenicol Deferasirox This list may not describe all possible interactions. Give your health care provider a list of all the medicines, herbs, non-prescription drugs, or dietary supplements you use. Also tell them if you smoke,  drink alcohol, or use illegal drugs. Some items may interact with your medicine. What should I watch for while using this medication? Visit your care team regularly. Tell your care team if your symptoms do not start to get better or if they get worse. You may need blood work done while you are taking this medication. You may need to follow a special diet. Talk to your care team. Foods that contain iron include: whole grains/cereals, dried fruits, beans, or peas, leafy green vegetables, and organ meats (liver, kidney). What side effects may I notice from receiving this medication? Side effects that you should report to your care team as soon as possible: Allergic reactions--skin rash, itching, hives, swelling of the face, lips, tongue, or throat Low blood pressure--dizziness, feeling faint or lightheaded, blurry vision Shortness of breath Side effects that usually do not require medical attention (report to your care team if they continue or are bothersome): Flushing Headache Joint pain Muscle pain Nausea Pain, redness, or irritation at injection site This list may not describe all possible side effects. Call your doctor for medical advice about side effects. You may report side effects to FDA at 1-800-FDA-1088. Where should I keep my medication? This medication is given in a hospital or clinic. It will not be stored at home. NOTE: This sheet is a summary. It may not cover all possible information. If you have questions about this medicine, talk to your doctor, pharmacist, or health care provider.  2024 Elsevier/Gold Standard (2023-05-18 00:00:00)

## 2023-10-19 ENCOUNTER — Inpatient Hospital Stay: Payer: Medicare Other

## 2023-10-19 VITALS — BP 115/51 | HR 70 | Temp 98.2°F | Resp 18 | Ht 63.0 in | Wt 132.8 lb

## 2023-10-19 DIAGNOSIS — N183 Chronic kidney disease, stage 3 unspecified: Secondary | ICD-10-CM | POA: Diagnosis not present

## 2023-10-19 DIAGNOSIS — D631 Anemia in chronic kidney disease: Secondary | ICD-10-CM

## 2023-10-19 DIAGNOSIS — Z79899 Other long term (current) drug therapy: Secondary | ICD-10-CM | POA: Diagnosis not present

## 2023-10-19 MED ORDER — IRON SUCROSE 20 MG/ML IV SOLN
200.0000 mg | Freq: Once | INTRAVENOUS | Status: AC
Start: 2023-10-19 — End: 2023-10-19
  Administered 2023-10-19: 200 mg via INTRAVENOUS

## 2023-10-19 NOTE — Patient Instructions (Signed)
Iron Sucrose Injection What is this medication? IRON SUCROSE (EYE ern SOO krose) treats low levels of iron (iron deficiency anemia) in people with kidney disease. Iron is a mineral that plays an important role in making red blood cells, which carry oxygen from your lungs to the rest of your body. This medicine may be used for other purposes; ask your health care provider or pharmacist if you have questions. COMMON BRAND NAME(S): Venofer What should I tell my care team before I take this medication? They need to know if you have any of these conditions: Anemia not caused by low iron levels Heart disease High levels of iron in the blood Kidney disease Liver disease An unusual or allergic reaction to iron, other medications, foods, dyes, or preservatives Pregnant or trying to get pregnant Breastfeeding How should I use this medication? This medication is for infusion into a vein. It is given in a hospital or clinic setting. Talk to your care team about the use of this medication in children. While this medication may be prescribed for children as young as 2 years for selected conditions, precautions do apply. Overdosage: If you think you have taken too much of this medicine contact a poison control center or emergency room at once. NOTE: This medicine is only for you. Do not share this medicine with others. What if I miss a dose? Keep appointments for follow-up doses. It is important not to miss your dose. Call your care team if you are unable to keep an appointment. What may interact with this medication? Do not take this medication with any of the following: Deferoxamine Dimercaprol Other iron products This medication may also interact with the following: Chloramphenicol Deferasirox This list may not describe all possible interactions. Give your health care provider a list of all the medicines, herbs, non-prescription drugs, or dietary supplements you use. Also tell them if you smoke,  drink alcohol, or use illegal drugs. Some items may interact with your medicine. What should I watch for while using this medication? Visit your care team regularly. Tell your care team if your symptoms do not start to get better or if they get worse. You may need blood work done while you are taking this medication. You may need to follow a special diet. Talk to your care team. Foods that contain iron include: whole grains/cereals, dried fruits, beans, or peas, leafy green vegetables, and organ meats (liver, kidney). What side effects may I notice from receiving this medication? Side effects that you should report to your care team as soon as possible: Allergic reactions--skin rash, itching, hives, swelling of the face, lips, tongue, or throat Low blood pressure--dizziness, feeling faint or lightheaded, blurry vision Shortness of breath Side effects that usually do not require medical attention (report to your care team if they continue or are bothersome): Flushing Headache Joint pain Muscle pain Nausea Pain, redness, or irritation at injection site This list may not describe all possible side effects. Call your doctor for medical advice about side effects. You may report side effects to FDA at 1-800-FDA-1088. Where should I keep my medication? This medication is given in a hospital or clinic. It will not be stored at home. NOTE: This sheet is a summary. It may not cover all possible information. If you have questions about this medicine, talk to your doctor, pharmacist, or health care provider.  2024 Elsevier/Gold Standard (2023-05-18 00:00:00)

## 2023-11-06 ENCOUNTER — Other Ambulatory Visit: Payer: Self-pay | Admitting: Cardiology

## 2023-11-14 ENCOUNTER — Other Ambulatory Visit: Payer: Self-pay | Admitting: Student

## 2023-11-27 ENCOUNTER — Telehealth: Payer: Self-pay | Admitting: Cardiology

## 2023-11-27 MED ORDER — ATORVASTATIN CALCIUM 40 MG PO TABS: 40.0000 mg | ORAL_TABLET | Freq: Every day | ORAL | 2 refills | Status: DC

## 2023-11-27 NOTE — Telephone Encounter (Signed)
Rx refill sent to pharmacy. After much research the Atorvastatin was found and pt needed to be on it. Was discovered in 10/2022 that pt was on 2 statins Pravastatin and Atorvastatin. Dr. Dulce Sellar wanted the Pravastatin discontinued. Refill sent in for Atorvastatin today.

## 2023-11-27 NOTE — Telephone Encounter (Signed)
*  STAT* If patient is at the pharmacy, call can be transferred to refill team.   1. Which medications need to be refilled? (please list name of each medication and dose if known) atorvastatin (LIPITOR) 40 MG tablet   2. Would you like to learn more about the convenience, safety, & potential cost savings by using the [facility]? No    3. Are you open to using the [facility] No   4. Which pharmacy/location (including street and city if local pharmacy) is medication to be sent to?[facility] [phone] - [place], [place] - [address]   5. Do they need a 30 day or 90 day supply? 90 Day Supply  Pt is currently out medication.

## 2023-12-06 ENCOUNTER — Ambulatory Visit: Payer: Medicare Other | Admitting: Oncology

## 2023-12-06 ENCOUNTER — Other Ambulatory Visit: Payer: Medicare Other

## 2023-12-06 NOTE — Progress Notes (Signed)
 [facility]  [address] [place],  [place]  [address] [phone]  Clinic Day:  12/07/2023  Referring physician: Gaither Juba, MD   HISTORY OF PRESENT ILLNESS:  The patient is a 77 y.o. female with anemia secondary to chronic renal insufficiency.   As she also had a downward trend in her iron  parameters, she received IV iron  in October 2024.  She comes in today to reassess her hemoglobin and iron  levels.  Since receiving her IV iron , the patient has felt somewhat better.  She continues to deny having any overt forms of blood loss.  PHYSICAL EXAM:  Blood pressure (!) 110/47, pulse 63, temperature 98.5 F (36.9 C), temperature source Oral, resp. rate 14, height 5\' 3"  (1.6 m), weight 137 lb 11.2 oz (62.5 kg), SpO2 96%. Wt Readings from Last 3 Encounters:  04/11/24 135 lb 12 oz (61.6 kg)  03/06/24 135 lb 4.8 oz (61.4 kg)  02/15/24 135 lb (61.2 kg)   Body mass index is 24.39 kg/m. Performance status (ECOG): 1 - Symptomatic but completely ambulatory Physical Exam Constitutional:      Appearance: Normal appearance. She is not ill-appearing.  HENT:     Mouth/Throat:     Mouth: Mucous membranes are moist.     Pharynx: Oropharynx is clear. No oropharyngeal exudate or posterior oropharyngeal erythema.  Cardiovascular:     Rate and Rhythm: Normal rate and regular rhythm.     Heart sounds: No murmur heard.    No friction rub. No gallop.  Pulmonary:     Effort: Pulmonary effort is normal. No respiratory distress.     Breath sounds: Normal breath sounds. No wheezing, rhonchi or rales.  Abdominal:     General: Bowel sounds are normal. There is no distension.     Palpations: Abdomen is soft. There is no mass.     Tenderness: There is no abdominal tenderness.  Musculoskeletal:        General: No swelling.     Right lower leg: No edema.     Left lower leg: No edema.  Lymphadenopathy:     Cervical: No cervical adenopathy.     Upper Body:     Right  upper body: No supraclavicular or axillary adenopathy.     Left upper body: No supraclavicular or axillary adenopathy.     Lower Body: No right inguinal adenopathy. No left inguinal adenopathy.  Skin:    General: Skin is warm.     Coloration: Skin is not jaundiced.     Findings: No lesion or rash.  Neurological:     General: No focal deficit present.     Mental Status: She is alert and oriented to person, place, and time. Mental status is at baseline.  Psychiatric:        Mood and Affect: Mood normal.        Behavior: Behavior normal.        Thought Content: Thought content normal.    LABS:    Latest Reference Range & Units 12/07/23 09:44  Sodium 135 - 145 mmol/L 129 (L)  Potassium 3.5 - 5.1 mmol/L 5.2 (H)  Chloride 98 - 111 mmol/L 94 (L)  CO2 22 - 32 mmol/L 24  Glucose 70 - 99 mg/dL 914 (H)  BUN 8 - 23 mg/dL 30 (H)  Creatinine 7.82 - 1.00 mg/dL 9.56 (H)  Calcium  8.9 - 10.3 mg/dL 9.8  Anion gap 5 - 15  10  Alkaline Phosphatase 38 - 126 U/L 100  Albumin 3.5 - 5.0 g/dL 4.0  AST 15 - 41 U/L 15  ALT 0 - 44 U/L 5  Total Protein 6.5 - 8.1 g/dL 7.0  Total Bilirubin <1.6 mg/dL 0.4  GFR, Est Non African American >60 mL/min 32 (L)  Iron  28 - 170 ug/dL 64  UIBC ug/dL 109  TIBC 604 - 540 ug/dL 981  Saturation Ratios 10.4 - 31.8 % 24  Ferritin 11 - 307 ng/mL 513 (H)  WBC 4.0 - 10.5 K/uL 6.6  RBC 3.87 - 5.11 MIL/uL 2.65 (L)  Hemoglobin 12.0 - 15.0 g/dL 8.0 (L)  HCT 19.1 - 47.8 % 24.0 (L)  MCV 80.0 - 100.0 fL 90.6  MCH 26.0 - 34.0 pg 30.2  MCHC 30.0 - 36.0 g/dL 29.5  RDW 62.1 - 30.8 % 15.3  Platelets 150 - 400 K/uL 190  nRBC 0.0 - 0.2 % 0 /100 WBC 0.00 0  Neutrophils % 69  Lymphocytes % 17  Monocytes Relative % 8  Eosinophil % 5  Basophil % 0  Immature Granulocytes % 1  NEUT# 1.7 - 7.7 K/uL 4.6  Lymphs Abs 0.7 - 4.0 K/uL 1.1  Monocyte # 0.1 - 1.0 K/uL 0.5  Eosinophils Absolute 0.0 - 0.5 K/uL 0.3  Basophils Absolute 0.0 - 0.1 K/uL 0.0  Abs Immature Granulocytes 0.00 -  0.07 K/uL 0.04  (L): Data is abnormally low (H): Data is abnormally high.  ASSESSMENT & PLAN:  Assessment/Plan:  A 77 y.o. female with anemia secondary to chronic renal insufficiency.  Despite having recently received IV iron , her hemoglobin remains significantly less than 10.  Based upon this, she will get back to receiving monthly Retacrit  injections, with the goal being to get her hemoglobin to/above 10.  Moving forward, her CBC will be checked monthly.  I will see her back in 3 months for repeat clinical assessment.  The patient understands all the plans discussed today and is in agreement with them.    Ngai Parcell Felicia Horde, MD

## 2023-12-07 ENCOUNTER — Other Ambulatory Visit: Payer: Self-pay | Admitting: Oncology

## 2023-12-07 ENCOUNTER — Telehealth: Payer: Self-pay | Admitting: Oncology

## 2023-12-07 ENCOUNTER — Inpatient Hospital Stay: Payer: Medicare Other | Attending: Oncology

## 2023-12-07 ENCOUNTER — Inpatient Hospital Stay (HOSPITAL_BASED_OUTPATIENT_CLINIC_OR_DEPARTMENT_OTHER): Payer: Medicare Other | Admitting: Oncology

## 2023-12-07 VITALS — BP 110/47 | HR 63 | Temp 98.5°F | Resp 14 | Ht 63.0 in | Wt 137.7 lb

## 2023-12-07 DIAGNOSIS — D631 Anemia in chronic kidney disease: Secondary | ICD-10-CM

## 2023-12-07 DIAGNOSIS — N183 Chronic kidney disease, stage 3 unspecified: Secondary | ICD-10-CM

## 2023-12-07 DIAGNOSIS — N189 Chronic kidney disease, unspecified: Secondary | ICD-10-CM

## 2023-12-07 LAB — CBC WITH DIFFERENTIAL (CANCER CENTER ONLY)
Abs Immature Granulocytes: 0.04 10*3/uL (ref 0.00–0.07)
Basophils Absolute: 0 10*3/uL (ref 0.0–0.1)
Basophils Relative: 0 %
Eosinophils Absolute: 0.3 10*3/uL (ref 0.0–0.5)
Eosinophils Relative: 5 %
HCT: 24 % — ABNORMAL LOW (ref 36.0–46.0)
Hemoglobin: 8 g/dL — ABNORMAL LOW (ref 12.0–15.0)
Immature Granulocytes: 1 %
Lymphocytes Relative: 17 %
Lymphs Abs: 1.1 10*3/uL (ref 0.7–4.0)
MCH: 30.2 pg (ref 26.0–34.0)
MCHC: 33.3 g/dL (ref 30.0–36.0)
MCV: 90.6 fL (ref 80.0–100.0)
Monocytes Absolute: 0.5 10*3/uL (ref 0.1–1.0)
Monocytes Relative: 8 %
Neutro Abs: 4.6 10*3/uL (ref 1.7–7.7)
Neutrophils Relative %: 69 %
Platelet Count: 190 10*3/uL (ref 150–400)
RBC: 2.65 MIL/uL — ABNORMAL LOW (ref 3.87–5.11)
RDW: 15.3 % (ref 11.5–15.5)
WBC Count: 6.6 10*3/uL (ref 4.0–10.5)
nRBC: 0 % (ref 0.0–0.2)
nRBC: 0 /100{WBCs}

## 2023-12-07 LAB — IRON AND TIBC
Iron: 64 ug/dL (ref 28–170)
Saturation Ratios: 24 % (ref 10.4–31.8)
TIBC: 270 ug/dL (ref 250–450)
UIBC: 206 ug/dL

## 2023-12-07 LAB — CMP (CANCER CENTER ONLY)
ALT: 5 U/L (ref 0–44)
AST: 15 U/L (ref 15–41)
Albumin: 4 g/dL (ref 3.5–5.0)
Alkaline Phosphatase: 100 U/L (ref 38–126)
Anion gap: 10 (ref 5–15)
BUN: 30 mg/dL — ABNORMAL HIGH (ref 8–23)
CO2: 24 mmol/L (ref 22–32)
Calcium: 9.8 mg/dL (ref 8.9–10.3)
Chloride: 94 mmol/L — ABNORMAL LOW (ref 98–111)
Creatinine: 1.64 mg/dL — ABNORMAL HIGH (ref 0.44–1.00)
GFR, Estimated: 32 mL/min — ABNORMAL LOW (ref >60)
Glucose, Bld: 113 mg/dL — ABNORMAL HIGH (ref 70–99)
Potassium: 5.2 mmol/L — ABNORMAL HIGH (ref 3.5–5.1)
Sodium: 129 mmol/L — ABNORMAL LOW (ref 135–145)
Total Bilirubin: 0.4 mg/dL (ref <1.2)
Total Protein: 7 g/dL (ref 6.5–8.1)

## 2023-12-07 LAB — FERRITIN: Ferritin: 513 ng/mL — ABNORMAL HIGH (ref 11–307)

## 2023-12-07 NOTE — Telephone Encounter (Signed)
Patient has been scheduled for follow-up visit per 12/07/23 LOS.  Pt given an appt calendar with date and time.

## 2023-12-10 ENCOUNTER — Encounter: Payer: Self-pay | Admitting: Oncology

## 2023-12-10 ENCOUNTER — Telehealth: Payer: Self-pay

## 2023-12-10 NOTE — Telephone Encounter (Addendum)
12/16 @ 0858 - Pt notified that Dr Melvyn Neth hasn't reviewed her lab results as of yet, no office note completed. I will discuss with Dr Melvyn Neth and call her back.  12/16 @ 1130- Dr Melvyn Neth reviewed labs, wants pt to have red cell injections. No iron infusion needed.

## 2023-12-12 DIAGNOSIS — J984 Other disorders of lung: Secondary | ICD-10-CM | POA: Diagnosis not present

## 2023-12-12 DIAGNOSIS — J189 Pneumonia, unspecified organism: Secondary | ICD-10-CM | POA: Diagnosis not present

## 2023-12-20 ENCOUNTER — Other Ambulatory Visit: Payer: Self-pay

## 2023-12-20 DIAGNOSIS — D508 Other iron deficiency anemias: Secondary | ICD-10-CM

## 2023-12-21 ENCOUNTER — Inpatient Hospital Stay: Payer: Medicare Other

## 2023-12-21 VITALS — BP 114/47 | HR 82 | Temp 98.0°F | Resp 18

## 2023-12-21 DIAGNOSIS — D631 Anemia in chronic kidney disease: Secondary | ICD-10-CM

## 2023-12-21 DIAGNOSIS — N183 Chronic kidney disease, stage 3 unspecified: Secondary | ICD-10-CM | POA: Diagnosis not present

## 2023-12-21 DIAGNOSIS — D508 Other iron deficiency anemias: Secondary | ICD-10-CM

## 2023-12-21 LAB — CBC WITH DIFFERENTIAL (CANCER CENTER ONLY)
Abs Immature Granulocytes: 0.09 10*3/uL — ABNORMAL HIGH (ref 0.00–0.07)
Basophils Absolute: 0.1 10*3/uL (ref 0.0–0.1)
Basophils Relative: 1 %
Eosinophils Absolute: 0.3 10*3/uL (ref 0.0–0.5)
Eosinophils Relative: 4 %
HCT: 25.3 % — ABNORMAL LOW (ref 36.0–46.0)
Hemoglobin: 8.6 g/dL — ABNORMAL LOW (ref 12.0–15.0)
Immature Granulocytes: 1 %
Lymphocytes Relative: 20 %
Lymphs Abs: 1.7 10*3/uL (ref 0.7–4.0)
MCH: 31.3 pg (ref 26.0–34.0)
MCHC: 34 g/dL (ref 30.0–36.0)
MCV: 92 fL (ref 80.0–100.0)
Monocytes Absolute: 1 10*3/uL (ref 0.1–1.0)
Monocytes Relative: 12 %
Neutro Abs: 5.3 10*3/uL (ref 1.7–7.7)
Neutrophils Relative %: 62 %
Platelet Count: 204 10*3/uL (ref 150–400)
RBC: 2.75 MIL/uL — ABNORMAL LOW (ref 3.87–5.11)
RDW: 16 % — ABNORMAL HIGH (ref 11.5–15.5)
WBC Count: 8.5 10*3/uL (ref 4.0–10.5)
nRBC: 0 % (ref 0.0–0.2)
nRBC: 0 /100{WBCs}

## 2023-12-21 LAB — CMP (CANCER CENTER ONLY)
ALT: 13 U/L (ref 0–44)
AST: 15 U/L (ref 15–41)
Albumin: 4.3 g/dL (ref 3.5–5.0)
Alkaline Phosphatase: 105 U/L (ref 38–126)
Anion gap: 13 (ref 5–15)
BUN: 37 mg/dL — ABNORMAL HIGH (ref 8–23)
CO2: 23 mmol/L (ref 22–32)
Calcium: 9.8 mg/dL (ref 8.9–10.3)
Chloride: 93 mmol/L — ABNORMAL LOW (ref 98–111)
Creatinine: 2.39 mg/dL — ABNORMAL HIGH (ref 0.44–1.00)
GFR, Estimated: 20 mL/min — ABNORMAL LOW (ref >60)
Glucose, Bld: 107 mg/dL — ABNORMAL HIGH (ref 70–99)
Potassium: 5 mmol/L (ref 3.5–5.1)
Sodium: 129 mmol/L — ABNORMAL LOW (ref 135–145)
Total Bilirubin: 0.3 mg/dL (ref <1.2)
Total Protein: 7.2 g/dL (ref 6.5–8.1)

## 2023-12-21 LAB — IRON AND TIBC
Iron: 77 ug/dL (ref 28–170)
Saturation Ratios: 27 % (ref 10.4–31.8)
TIBC: 283 ug/dL (ref 250–450)
UIBC: 206 ug/dL

## 2023-12-21 LAB — FERRITIN: Ferritin: 554 ng/mL — ABNORMAL HIGH (ref 11–307)

## 2023-12-21 MED ORDER — EPOETIN ALFA-EPBX 40000 UNIT/ML IJ SOLN
40000.0000 [IU] | Freq: Once | INTRAMUSCULAR | Status: AC
Start: 2023-12-21 — End: 2023-12-21
  Administered 2023-12-21: 40000 [IU] via SUBCUTANEOUS

## 2023-12-21 NOTE — Patient Instructions (Signed)

## 2024-01-14 ENCOUNTER — Encounter: Payer: Self-pay | Admitting: Cardiology

## 2024-01-14 NOTE — Progress Notes (Unsigned)
Cardiology Office Note:    Date:  01/15/2024   ID:  Angela Mccoy, DOB 1946/03/19, MRN [id number]  PCP:  Marylen Ponto, MD  Cardiologist:  Norman Herrlich, MD    Referring MD: Marylen Ponto, MD    ASSESSMENT:    1. Coronary artery disease involving native coronary artery of native heart with angina pectoris (HCC)   2. Essential hypertension   3. Mixed hyperlipidemia   4. CKD (chronic kidney disease) stage 4, GFR 15-29 ml/min (HCC)   5. Iron deficiency anemia, unspecified iron deficiency anemia type    PLAN:    In order of problems listed above:  Angela Mccoy continues to do well with CAD despite the loss of her husband she has had no anginal discomfort following her previous PCI's we will continue her current medical treatment including long-term clopidogrel her beta-blocker and I will reduce her ARB dosage by 50% and continue her lipid-lowering therapy with a high intensity statin Recheck a lipid profile as well as APO B for optimization Kidney function is worsened stage IV she has been seen by nephrology in [place] He continues to be quite anemic followed by hematology   Next appointment: I will plan to see her back in my office in 1 year   Medication Adjustments/Labs and Tests Ordered: Current medicines are reviewed at length with the patient today.  Concerns regarding medicines are outlined above.  No orders of the defined types were placed in this encounter.  No orders of the defined types were placed in this encounter.    History of Present Illness:    Marketta Mccoy is a 78 y.o. female with a hx of CAD with remote PCI and drug-eluting stent in the LAD and right coronary artery 2023 and most recent PCI for restenosis October 2022 hypertension hyperlipidemia iron deficiency anemia and stage IV CKD last seen 10/24/2022.  Compliance with diet, lifestyle and medications: Yes  Sadly Angela Mccoy passed within the last few months she is going on quite well  with that and has had no angina edema shortness of breath orthopnea palpitation or syncope Continues to have anemia looks like it is refractory her last hemoglobin was 8.6 she follows with hematology She has been seen by nephrology and her last creatinine was worsened with a GFR 20 cc/min stage IV CKD 3 months ago her GFR was 42 cc/min Tolerates her lipid-lowering therapy without muscle pain or weakness She has had lightheadedness low blood pressure we will reduce her ARB by 50% Past Medical History:  Diagnosis Date   Anemia 07/31/2017   Anemia in chronic kidney disease 10/03/2020   Anxiety and depression    CAD S/P percutaneous coronary angioplasty 10/18/2009   Carotid bruit 02/26/2016   Carotid bruit present 02/26/2016   Cavitary pneumonia 03/20/2019   Onset of symptoms around late  Feb 2020 > all symptoms resolved p 5 days Meropenem and rx x 2 weeks then changed to cleocin -  D/c cleocin 03/20/2019  With esr 12, wbc 7,800  -  Quant TB 03/20/2019  Neg  - 05/14/2019 cxr with minimal streaky residual, no as dz    Coronary artery disease involving native coronary artery 02/26/2016   Coronary artery disease involving native coronary artery of native heart with angina pectoris (HCC) 03/12/2016   Overview:  PCI and DES to LAD and RCA in 2003, cath 2010 wo restenosis   Essential hypertension 03/12/2016   GERD (gastroesophageal reflux disease)    High cholesterol 01/17/2017  History of colon polyps    Hx of diverticulitis of colon 09/11/2016   Hyperlipidemia 03/12/2016   Hyperlipidemia with target LDL less than 70 03/12/2016   Hypertension 01/27/2016   IBS (irritable bowel syndrome)    Insomnia 02/26/2016   Iron deficiency anemia 10/01/2023   Migraine with aura 02/26/2016   Mitral valve prolapse 02/26/2016   Peripheral vascular disease (HCC) 02/26/2016   Persistent atrial fibrillation (HCC) 11/06/2019   Pneumonia of right upper lobe due to infectious organism 02/26/2019   Pre-op evaluation  11/19/2019   Rheumatoid arthritis involving multiple joints (HCC) 02/26/2016   Rheumatoid arthritis involving multiple sites (HCC) 02/26/2016   Unstable angina (HCC) 10/18/2021    Current Medications: Current Meds  Medication Sig   albuterol (VENTOLIN HFA) 108 (90 Base) MCG/ACT inhaler SMARTSIG:2 Puff(s) By Mouth Every 2-3 Hours   carvedilol (COREG) 6.25 MG tablet Take 1 tablet (6.25 mg total) by mouth 2 (two) times daily with a meal.   chlorhexidine (PERIDEX) 0.12 % solution SMARTSIG:15 Milliliter(s) By Mouth   chlorthalidone (HYGROTON) 25 MG tablet Take 25 mg by mouth daily.   citalopram (CELEXA) 20 MG tablet Take 20 mg by mouth daily.   clopidogrel (PLAVIX) 75 MG tablet TAKE 1 TABLET(75 MG) BY MOUTH DAILY   isosorbide mononitrate (IMDUR) 30 MG 24 hr tablet Take 1 tablet (30 mg total) by mouth daily.   Magnesium Oxide -Mg Supplement 200 MG TABS Take 1 tablet (200 mg total) by mouth daily.   Multiple Vitamins-Minerals (MULTIVITAMIN WITH MINERALS) tablet Take 1 tablet by mouth daily.   nitroGLYCERIN (NITROSTAT) 0.4 MG SL tablet ONE TABLET UNDER TONGUE AS NEEDED FOR CHEST PAIN EVERY 5 MINUTES   pantoprazole (PROTONIX) 40 MG tablet Take 40 mg by mouth 2 (two) times daily.   potassium chloride SA (KLOR-CON) 20 MEQ tablet Take 20 mEq by mouth 2 (two) times daily.   QUEtiapine (SEROQUEL) 300 MG tablet Take 300 mg by mouth at bedtime.   scopolamine (TRANSDERM-SCOP) 1 MG/3DAYS 1 patch every 3 (three) days.   traMADol (ULTRAM) 50 MG tablet Take 50 mg by mouth 3 (three) times daily.   valsartan (DIOVAN) 80 MG tablet Take 1 tablet (80 mg total) by mouth daily.      EKGs/Labs/Other Studies Reviewed:    The following studies were reviewed today:  Cardiac Studies & Procedures   CARDIAC CATHETERIZATION  CARDIAC CATHETERIZATION 10/18/2021  Narrative   Ost RCA to Prox RCA stent is 10% in-stent restenosis.   CULPRIT LESION: Prox RCA to Mid RCA lesion is 85% stenosed just beyond the stent.    After scoring balloon angioplasty, A drug-eluting stent was successfully placed (overlapping previous stent) using a SYNERGY XD 2.75X28.  Postdilated to 3.1 mm   Post intervention, there is a 5% residual stenosis at the most significant segment, however the remainder has 0% visual stenosis.Marland Kitchen   ------------------------------   Mid LAD to Dist LAD previously placed stent is 10% stenosed (actually focally at small D2).   ------------------------------   The left ventricular systolic function is normal.  The left ventricular ejection fraction is 55-65% by visual estimate.   There is no aortic valve stenosis.  SUMMARY Two-vessel CAD: Widely patent LAD stent with maybe 10 to 15% ISR, otherwise normal LCA system with small nondominant LCx Large dominant RCA with proximal stent that has distal edge leading into a native vessel 85% irregular stenosis.  (CULPRIT LESION) Successful Scoring Balloon Angioplasty followed by DES PCI with an overlapping DES stent (2.75 mm x 28  mm postdilated to 3.1 mm) Normal LVEF and EDP.   RECOMMENDATIONS Stable for Same-Day PCI. Is already on Plavix -> uninterrupted DAPT now for at least 6 months. Continue aggressive risk factor modification.  Follow-up with Dr. Dulce Sellar.    Bryan Lemma, MD  Findings Coronary Findings Diagnostic  Dominance: Right  Left Anterior Descending Mid LAD to Dist LAD lesion is 10% stenosed. The lesion is focal. The lesion was previously treated using a stent (unknown type) over 2 years ago. Previously placed stent displays restenosis. The stenosis was measured by a visual reading.  Second Diagonal Branch Vessel is small in size.  Third Diagonal Branch Vessel is small in size.  Left Circumflex Essentially courses almost to the ramus intermedius/high OM  Left Posterior Atrioventricular Artery Vessel is small in size.  Right Coronary Artery Vessel was injected. Vessel is normal in caliber. Ost RCA to Prox RCA lesion is 10%  stenosed. The lesion was previously treated using a stent (unknown type) over 2 years ago. Previously placed stent displays restenosis. Prox RCA to Mid RCA lesion is 85% stenosed. The lesion is located proximal to the major branch, segmental and irregular. Tapers from 85% up to about 50%.  Acute Marginal Branch Vessel is small in size.  Right Ventricular Branch Vessel is small in size.  Right Posterior Atrioventricular Artery Vessel is large in size.  First Right Posterolateral Branch Vessel is small in size.  Second Right Posterolateral Branch Vessel is moderate in size.  Intervention  Prox RCA to Mid RCA lesion Stent Lesion length:  24 mm. CATH VISTA GUIDE 6FR JR4 guide catheter was inserted. Lesion crossed with guidewire using a WIRE ASAHI PROWATER 180CM. Pre-stent angioplasty was performed using a BALLN SCOREFLEX 2.50X15. Maximum pressure:  14 atm. Inflation time: 30 sec. BALLN SAPPHIRE 2.0X15 -&gt; 12 ATM, 20 sec A drug-eluting stent was successfully placed using a SYNERGY XD 2.75X28. Maximum pressure: 18 atm. Inflation time: 30 sec. Stent strut is well apposed. At the original 85% lesion site there are 2 focal areas where there is maybe 10% residual stenosis despite aggressive post dilation. Stent overlaps previously placed stent. Post-stent angioplasty was performed using a BALLN [place] EMERGE MR 3.0X20. Maximum pressure:  20 atm. Inflation time:  20 sec. Post-Intervention Lesion Assessment The intervention was successful. Pre-interventional TIMI flow is 3. Post-intervention TIMI flow is 3. Treated lesion length:  26 mm. No complications occurred at this lesion. There is a 5% residual stenosis post intervention.   STRESS TESTS  MYOCARDIAL PERFUSION IMAGING 05/26/2021  Narrative  The left ventricular ejection fraction is hyperdynamic (>65%).  Nuclear stress EF: 67%.  There was no ST segment deviation noted during stress.  No T wave inversion was noted during stress.  This is  a low risk study.  The study is normal.  ECHOCARDIOGRAM  ECHOCARDIOGRAM COMPLETE 05/08/2023  Narrative ECHOCARDIOGRAM REPORT    Patient Name:   Angela Mccoy [facility] Date of Exam: 05/08/2023 Medical Rec #:  [id number]                 Height:       63.0 in Accession #:    [id number]                Weight:       142.5 lb Date of Birth:  March 25, 1946                BSA:          1.674 m Patient Age:  76 years                  BP:           110/60 mmHg Patient Gender: F                         HR:           64 bpm. Exam Location:  [place]  Procedure: 2D Echo, Cardiac Doppler, Color Doppler and Strain Analysis  Indications:    Coronary artery disease involving native coronary artery of native heart with angina pectoris (HCC) [I25.119 (ICD-10-CM)]; Essential hypertension [I10 (ICD-10-CM)]; Hyperlipidemia, unspecified hyperlipidemia type [E78.5 (ICD-10-CM)]; CKD (chronic kidney disease) stage 4, GFR 15-29 ml/min (HCC) [N18.4 (ICD-10-CM)]; Iron deficiency anemia, unspecified iron deficiency anemia type [D50.9 (ICD-10-CM)]; Mild tricuspid regurgitation by prior echocardiogram [I07.1 (ICD-10-CM)]; Other fatigue [R53.83 (ICD-10-CM)]; DOE (dyspnea on exertion) [R06.09 (ICD-10-CM)]  History:        Patient has prior history of Echocardiogram examinations, most recent 02/16/2019. CAD, Arrythmias:Atrial Fibrillation; Risk Factors:Dyslipidemia and Hypertension.  Sonographer:    Margreta Journey RDCS Referring Phys: [phone] JENNIFER C WOODY  IMPRESSIONS   1. Sigmoid septum. Left ventricular ejection fraction, by estimation, is 60 to 65%. The left ventricle has normal function. The left ventricle has no regional wall motion abnormalities. Left ventricular diastolic parameters are consistent with Grade I diastolic dysfunction (impaired relaxation). The average left ventricular global longitudinal strain is -17.2 %. The global longitudinal strain is abnormal. 2. Right ventricular systolic  function is normal. The right ventricular size is normal. There is normal pulmonary artery systolic pressure. 3. The mitral valve is normal in structure. No evidence of mitral valve regurgitation. No evidence of mitral stenosis. 4. Tricuspid valve regurgitation is mild to moderate. 5. The aortic valve is normal in structure. Aortic valve regurgitation is not visualized. No aortic stenosis is present. 6. The inferior vena cava is normal in size with greater than 50% respiratory variability, suggesting right atrial pressure of 3 mmHg.  FINDINGS Left Ventricle: Sigmoid septum. Left ventricular ejection fraction, by estimation, is 60 to 65%. The left ventricle has normal function. The left ventricle has no regional wall motion abnormalities. The average left ventricular global longitudinal strain is -17.2 %. The global longitudinal strain is abnormal. The left ventricular internal cavity size was normal in size. There is no left ventricular hypertrophy. Left ventricular diastolic parameters are consistent with Grade I diastolic dysfunction (impaired relaxation).  Right Ventricle: The right ventricular size is normal. No increase in right ventricular wall thickness. Right ventricular systolic function is normal. There is normal pulmonary artery systolic pressure. The tricuspid regurgitant velocity is 2.30 m/s, and with an assumed right atrial pressure of 3 mmHg, the estimated right ventricular systolic pressure is 24.2 mmHg.  Left Atrium: Left atrial size was normal in size.  Right Atrium: Right atrial size was normal in size.  Pericardium: There is no evidence of pericardial effusion.  Mitral Valve: The mitral valve is normal in structure. No evidence of mitral valve regurgitation. No evidence of mitral valve stenosis.  Tricuspid Valve: The tricuspid valve is normal in structure. Tricuspid valve regurgitation is mild to moderate. No evidence of tricuspid stenosis.  Aortic Valve: The aortic valve  is normal in structure. Aortic valve regurgitation is not visualized. No aortic stenosis is present.  Pulmonic Valve: The pulmonic valve was normal in structure. Pulmonic valve regurgitation is trivial. No evidence of pulmonic stenosis.  Aorta: The aortic root is  normal in size and structure.  Venous: The inferior vena cava is normal in size with greater than 50% respiratory variability, suggesting right atrial pressure of 3 mmHg.  IAS/Shunts: No atrial level shunt detected by color flow Doppler.   LEFT VENTRICLE PLAX 2D LVIDd:         4.10 cm   Diastology LVIDs:         2.60 cm   LV e' medial:    6.85 cm/s LV PW:         1.00 cm   LV E/e' medial:  10.6 LV IVS:        1.60 cm   LV e' lateral:   10.60 cm/s LVOT diam:     1.80 cm   LV E/e' lateral: 6.8 LVOT Area:     2.54 cm 2D Longitudinal Strain 2D Strain GLS Avg:     -17.2 %  RIGHT VENTRICLE             IVC RV Basal diam:  2.30 cm     IVC diam: 1.40 cm RV Mid diam:    1.50 cm RV S prime:     13.15 cm/s TAPSE (M-mode): 2.2 cm  LEFT ATRIUM             Index        RIGHT ATRIUM           Index LA diam:        3.80 cm 2.27 cm/m   RA Area:     12.00 cm LA Vol (A2C):   41.1 ml 24.55 ml/m  RA Volume:   26.20 ml  15.65 ml/m LA Vol (A4C):   53.9 ml 32.19 ml/m LA Biplane Vol: 48.1 ml 28.73 ml/m PULMONIC VALVE AORTA                 PR End Diast Vel: 4.49 msec Ao Root diam: 3.30 cm Ao Asc diam:  3.00 cm Ao Desc diam: 1.90 cm  MITRAL VALVE               TRICUSPID VALVE MV Area (PHT): 3.96 cm    TR Peak grad:   21.2 mmHg MV Decel Time: 192 msec    TR Vmax:        230.00 cm/s MV E velocity: 72.53 cm/s MV A velocity: 94.97 cm/s  SHUNTS MV E/A ratio:  0.76        Systemic Diam: 1.80 cm  Gypsy Balsam MD Electronically signed by Gypsy Balsam MD Signature Date/Time: 05/09/2023/8:50:36 AM    Final   MONITORS  LONG TERM MONITOR (3-14 DAYS) 11/24/2019  Narrative ZIO monitor was used for 3 days to assess palpitation  in the setting of coronary artery disease.  The study initiated 11/06/2019.  The rhythm throughout was sinus with minimum, average and maximum heart rates of 46, 60 and 99 bpm.  There were no pauses of 3 seconds or greater or episodes of AV nodal or sinus node block.  Ventricular ectopy was rare with isolated PVCs  Supraventricular ectopy was rare with isolated APCs.  There was one 6 beat run of ectopic atrial rhythm noted asymptomatic.  There were no episodes of atrial fibrillation or flutter.  There are 11 triggered events 1 associated with rare atrial premature contraction.  There were 8 diary events of chest pain palpitation shortness of breath and lightheadedness 1 associated with rare atrial premature contraction   Conclusion unremarkable 3-day ZIO monitor triggered and diary events are generally unassociated  with arrhythmia.               Recent Labs: 04/10/2023: NT-Pro BNP 498; TSH 4.410 05/08/2023: Magnesium 1.4 12/21/2023: ALT 13; BUN 37; Creatinine 2.39; Hemoglobin 8.6; Platelet Count 204; Potassium 5.0; Sodium 129  Recent Lipid Panel    Component Value Date/Time   CHOL 145 10/10/2021 0938   TRIG 248 (H) 10/10/2021 0938   HDL 42 10/10/2021 0938   CHOLHDL 3.5 10/10/2021 0938   LDLCALC 63 10/10/2021 0938    Physical Exam:    VS:  BP 112/62   Pulse 66   Ht 5\' 3"  (1.6 m)   Wt 131 lb 3.2 oz (59.5 kg)   SpO2 96%   BMI 23.24 kg/m     Wt Readings from Last 3 Encounters:  01/15/24 131 lb 3.2 oz (59.5 kg)  12/07/23 137 lb 11.2 oz (62.5 kg)  10/19/23 132 lb 12 oz (60.2 kg)     GEN:  Well nourished, well developed in no acute distress HEENT: Normal NECK: No JVD; No carotid bruits LYMPHATICS: No lymphadenopathy CARDIAC: RRR, no murmurs, rubs, gallops RESPIRATORY:  Clear to auscultation without rales, wheezing or rhonchi  ABDOMEN: Soft, non-tender, non-distended MUSCULOSKELETAL:  No edema; No deformity  SKIN: Warm and dry NEUROLOGIC:  Alert and oriented x  3 PSYCHIATRIC:  Normal affect    Signed, Norman Herrlich, MD  01/15/2024 1:33 PM    [facility]

## 2024-01-15 ENCOUNTER — Ambulatory Visit: Payer: Medicare Other | Attending: Cardiology | Admitting: Cardiology

## 2024-01-15 ENCOUNTER — Encounter: Payer: Self-pay | Admitting: Cardiology

## 2024-01-15 VITALS — BP 112/62 | HR 66 | Ht 63.0 in | Wt 131.2 lb

## 2024-01-15 DIAGNOSIS — I10 Essential (primary) hypertension: Secondary | ICD-10-CM | POA: Diagnosis not present

## 2024-01-15 DIAGNOSIS — E782 Mixed hyperlipidemia: Secondary | ICD-10-CM | POA: Diagnosis not present

## 2024-01-15 DIAGNOSIS — I25119 Atherosclerotic heart disease of native coronary artery with unspecified angina pectoris: Secondary | ICD-10-CM | POA: Insufficient documentation

## 2024-01-15 DIAGNOSIS — D509 Iron deficiency anemia, unspecified: Secondary | ICD-10-CM | POA: Diagnosis not present

## 2024-01-15 DIAGNOSIS — N184 Chronic kidney disease, stage 4 (severe): Secondary | ICD-10-CM | POA: Diagnosis not present

## 2024-01-15 MED ORDER — VALSARTAN 40 MG PO TABS
40.0000 mg | ORAL_TABLET | Freq: Every day | ORAL | 3 refills | Status: DC
Start: 2024-01-15 — End: 2024-09-12

## 2024-01-15 NOTE — Addendum Note (Signed)
Addended by: Roxanne Mins I on: 01/15/2024 01:41 PM   Modules accepted: Orders

## 2024-01-15 NOTE — Patient Instructions (Signed)
Medication Instructions:  Your physician has recommended you make the following change in your medication:   START: Valsartan 40 mg daily  *If you need a refill on your cardiac medications before your next appointment, please call your pharmacy*   Lab Work: Your physician recommends that you return for lab work in:   Labs today: Lipids, Apo B  If you have labs (blood work) drawn today and your tests are completely normal, you will receive your results only by: MyChart Message (if you have MyChart) OR A paper copy in the mail If you have any lab test that is abnormal or we need to change your treatment, we will call you to review the results.   Testing/Procedures: None   Follow-Up: At [facility], you and your health needs are our priority.  As part of our continuing mission to provide you with exceptional heart care, we have created designated Provider Care Teams.  These Care Teams include your primary Cardiologist (physician) and Advanced Practice Providers (APPs -  Physician Assistants and Nurse Practitioners) who all work together to provide you with the care you need, when you need it.  We recommend signing up for the patient portal called "MyChart".  Sign up information is provided on this After Visit Summary.  MyChart is used to connect with patients for Virtual Visits (Telemedicine).  Patients are able to view lab/test results, encounter notes, upcoming appointments, etc.  Non-urgent messages can be sent to your provider as well.   To learn more about what you can do with MyChart, go to [email or web address].    Your next appointment:   1 year(s)  Provider:   Norman Herrlich, MD    Other Instructions None

## 2024-01-16 LAB — LIPID PANEL
Chol/HDL Ratio: 2.9 {ratio} (ref 0.0–4.4)
Cholesterol, Total: 120 mg/dL (ref 100–199)
HDL: 42 mg/dL (ref >39)
LDL Chol Calc (NIH): 50 mg/dL (ref 0–99)
Triglycerides: 170 mg/dL — ABNORMAL HIGH (ref 0–149)
VLDL Cholesterol Cal: 28 mg/dL (ref 5–40)

## 2024-01-16 LAB — APOLIPOPROTEIN B: Apolipoprotein B: 58 mg/dL (ref <90)

## 2024-01-18 ENCOUNTER — Inpatient Hospital Stay: Payer: Medicare Other

## 2024-01-18 ENCOUNTER — Other Ambulatory Visit: Payer: Self-pay

## 2024-01-18 ENCOUNTER — Inpatient Hospital Stay: Payer: Medicare Other | Attending: Oncology

## 2024-01-18 VITALS — BP 119/59 | HR 78 | Temp 99.0°F | Resp 18 | Ht 63.0 in | Wt 132.5 lb

## 2024-01-18 DIAGNOSIS — D631 Anemia in chronic kidney disease: Secondary | ICD-10-CM

## 2024-01-18 DIAGNOSIS — N184 Chronic kidney disease, stage 4 (severe): Secondary | ICD-10-CM | POA: Diagnosis not present

## 2024-01-18 LAB — IRON AND TIBC
Iron: 33 ug/dL (ref 28–170)
Saturation Ratios: 13 % (ref 10.4–31.8)
TIBC: 255 ug/dL (ref 250–450)
UIBC: 222 ug/dL

## 2024-01-18 LAB — CBC WITH DIFFERENTIAL (CANCER CENTER ONLY)
Abs Immature Granulocytes: 0.06 10*3/uL (ref 0.00–0.07)
Basophils Absolute: 0.1 10*3/uL (ref 0.0–0.1)
Basophils Relative: 1 %
Eosinophils Absolute: 0.2 10*3/uL (ref 0.0–0.5)
Eosinophils Relative: 2 %
HCT: 26.6 % — ABNORMAL LOW (ref 36.0–46.0)
Hemoglobin: 8.8 g/dL — ABNORMAL LOW (ref 12.0–15.0)
Immature Granulocytes: 1 %
Lymphocytes Relative: 15 %
Lymphs Abs: 1.4 10*3/uL (ref 0.7–4.0)
MCH: 30.4 pg (ref 26.0–34.0)
MCHC: 33.1 g/dL (ref 30.0–36.0)
MCV: 92 fL (ref 80.0–100.0)
Monocytes Absolute: 0.8 10*3/uL (ref 0.1–1.0)
Monocytes Relative: 9 %
Neutro Abs: 6.3 10*3/uL (ref 1.7–7.7)
Neutrophils Relative %: 72 %
Platelet Count: 176 10*3/uL (ref 150–400)
RBC: 2.89 MIL/uL — ABNORMAL LOW (ref 3.87–5.11)
RDW: 14.4 % (ref 11.5–15.5)
WBC Count: 8.8 10*3/uL (ref 4.0–10.5)
nRBC: 0 % (ref 0.0–0.2)
nRBC: 0 /100{WBCs}

## 2024-01-18 LAB — FERRITIN: Ferritin: 524 ng/mL — ABNORMAL HIGH (ref 11–307)

## 2024-01-18 MED ORDER — EPOETIN ALFA-EPBX 40000 UNIT/ML IJ SOLN
40000.0000 [IU] | Freq: Once | INTRAMUSCULAR | Status: AC
Start: 2024-01-18 — End: 2024-01-18
  Administered 2024-01-18: 40000 [IU] via SUBCUTANEOUS
  Filled 2024-01-18: qty 1

## 2024-02-05 ENCOUNTER — Other Ambulatory Visit: Payer: Self-pay | Admitting: Cardiology

## 2024-02-05 DIAGNOSIS — I10 Essential (primary) hypertension: Secondary | ICD-10-CM

## 2024-02-14 ENCOUNTER — Other Ambulatory Visit: Payer: Self-pay

## 2024-02-14 DIAGNOSIS — D631 Anemia in chronic kidney disease: Secondary | ICD-10-CM

## 2024-02-15 ENCOUNTER — Inpatient Hospital Stay: Payer: Medicare Other | Attending: Oncology

## 2024-02-15 ENCOUNTER — Inpatient Hospital Stay: Payer: Medicare Other

## 2024-02-15 VITALS — BP 124/60 | HR 80 | Temp 98.4°F | Resp 18 | Ht 63.0 in | Wt 135.0 lb

## 2024-02-15 DIAGNOSIS — N183 Chronic kidney disease, stage 3 unspecified: Secondary | ICD-10-CM

## 2024-02-15 DIAGNOSIS — D631 Anemia in chronic kidney disease: Secondary | ICD-10-CM | POA: Diagnosis not present

## 2024-02-15 DIAGNOSIS — N184 Chronic kidney disease, stage 4 (severe): Secondary | ICD-10-CM | POA: Diagnosis not present

## 2024-02-15 DIAGNOSIS — D508 Other iron deficiency anemias: Secondary | ICD-10-CM

## 2024-02-15 LAB — CBC WITH DIFFERENTIAL (CANCER CENTER ONLY)
Abs Immature Granulocytes: 0.02 10*3/uL (ref 0.00–0.07)
Basophils Absolute: 0.1 10*3/uL (ref 0.0–0.1)
Basophils Relative: 1 %
Eosinophils Absolute: 0.4 10*3/uL (ref 0.0–0.5)
Eosinophils Relative: 6 %
HCT: 27.5 % — ABNORMAL LOW (ref 36.0–46.0)
Hemoglobin: 9.3 g/dL — ABNORMAL LOW (ref 12.0–15.0)
Immature Granulocytes: 0 %
Lymphocytes Relative: 20 %
Lymphs Abs: 1.3 10*3/uL (ref 0.7–4.0)
MCH: 30.4 pg (ref 26.0–34.0)
MCHC: 33.8 g/dL (ref 30.0–36.0)
MCV: 89.9 fL (ref 80.0–100.0)
Monocytes Absolute: 0.6 10*3/uL (ref 0.1–1.0)
Monocytes Relative: 9 %
Neutro Abs: 4.1 10*3/uL (ref 1.7–7.7)
Neutrophils Relative %: 64 %
Platelet Count: 169 10*3/uL (ref 150–400)
RBC: 3.06 MIL/uL — ABNORMAL LOW (ref 3.87–5.11)
RDW: 13.9 % (ref 11.5–15.5)
WBC Count: 6.4 10*3/uL (ref 4.0–10.5)
nRBC: 0 % (ref 0.0–0.2)
nRBC: 0 /100{WBCs}

## 2024-02-15 LAB — IRON AND TIBC
Iron: 57 ug/dL (ref 28–170)
Saturation Ratios: 22 % (ref 10.4–31.8)
TIBC: 263 ug/dL (ref 250–450)
UIBC: 206 ug/dL

## 2024-02-15 MED ORDER — EPOETIN ALFA-EPBX 40000 UNIT/ML IJ SOLN
40000.0000 [IU] | Freq: Once | INTRAMUSCULAR | Status: AC
  Administered 2024-02-15: 40000 [IU] via SUBCUTANEOUS
  Filled 2024-02-15: qty 1

## 2024-02-15 NOTE — Patient Instructions (Signed)

## 2024-03-05 NOTE — Progress Notes (Unsigned)
 [facility]  [address] [place],  [place]  [address] [phone]  Clinic Day:  10/01/2023  Referring physician: Marylen Ponto, MD   HISTORY OF PRESENT ILLNESS:  The patient is a 78 y.o. female with anemia secondary to chronic renal insufficiency.   As she also had a downward trend in her iron parameters, she received IV iron in October 2024.  She comes in today to reassess her hemoglobin and iron levels.  PHYSICAL EXAM:  There were no vitals taken for this visit. Wt Readings from Last 3 Encounters:  02/15/24 135 lb (61.2 kg)  01/18/24 132 lb 8 oz (60.1 kg)  01/15/24 131 lb 3.2 oz (59.5 kg)   There is no height or weight on file to calculate BMI. Performance status (ECOG): 1 - Symptomatic but completely ambulatory Physical Exam Constitutional:      Appearance: Normal appearance. She is not ill-appearing.  HENT:     Mouth/Throat:     Mouth: Mucous membranes are moist.     Pharynx: Oropharynx is clear. No oropharyngeal exudate or posterior oropharyngeal erythema.  Cardiovascular:     Rate and Rhythm: Normal rate and regular rhythm.     Heart sounds: No murmur heard.    No friction rub. No gallop.  Pulmonary:     Effort: Pulmonary effort is normal. No respiratory distress.     Breath sounds: Normal breath sounds. No wheezing, rhonchi or rales.  Abdominal:     General: Bowel sounds are normal. There is no distension.     Palpations: Abdomen is soft. There is no mass.     Tenderness: There is no abdominal tenderness.  Musculoskeletal:        General: No swelling.     Right lower leg: No edema.     Left lower leg: No edema.  Lymphadenopathy:     Cervical: No cervical adenopathy.     Upper Body:     Right upper body: No supraclavicular or axillary adenopathy.     Left upper body: No supraclavicular or axillary adenopathy.     Lower Body: No right inguinal adenopathy. No left inguinal adenopathy.  Skin:    General: Skin is warm.      Coloration: Skin is not jaundiced.     Findings: No lesion or rash.  Neurological:     General: No focal deficit present.     Mental Status: She is alert and oriented to person, place, and time. Mental status is at baseline.  Psychiatric:        Mood and Affect: Mood normal.        Behavior: Behavior normal.        Thought Content: Thought content normal.    LABS:    Latest Reference Range & Units 10/01/23 08:50  Sodium 135 - 145 mmol/L 131 (L)  Potassium 3.5 - 5.1 mmol/L 4.3  Chloride 98 - 111 mmol/L 93 (L)  CO2 22 - 32 mmol/L 26  Glucose 70 - 99 mg/dL 914 (H)  BUN 8 - 23 mg/dL 28 (H)  Creatinine 7.82 - 1.00 mg/dL 9.56 (H)  Calcium 8.9 - 10.3 mg/dL 9.7  Anion gap 5 - 15  12  Alkaline Phosphatase 38 - 126 U/L 75  Albumin 3.5 - 5.0 g/dL 4.1  AST 15 - 41 U/L 15  ALT 0 - 44 U/L 14  Total Protein 6.5 - 8.1 g/dL 7.8  Total Bilirubin 0.3 - 1.2 mg/dL 0.7  GFR, Est Non African American >  60 mL/min 42 (L)  (L): Data is abnormally low (H): Data is abnormally high   Latest Reference Range & Units 10/01/23 08:50  Iron 28 - 170 ug/dL 31  UIBC ug/dL 478  TIBC 295 - 621 ug/dL 308  Saturation Ratios 10.4 - 31.8 % 9 (L)  Ferritin 11 - 307 ng/mL 151    Latest Reference Range & Units 03/27/23 08:22 07/03/23 08:29  Iron 28 - 170 ug/dL 69 59  UIBC ug/dL 657 846  TIBC 962 - 952 ug/dL 841 324  Saturation Ratios 10.4 - 31.8 % 23 19  Ferritin 11 - 307 ng/mL 204 193   ASSESSMENT & PLAN:  Assessment/Plan:  A 78 y.o. female with anemia secondary to chronic renal insufficiency.  However, her hemoglobin remains significantly less than 10.  When evaluating her iron parameters over these past months, there is clearly has been a downward trend in these levels.  Based upon this, I will arrange for her to receive IV iron doses these next few weeks to replenish her iron stores and improve  her hemoglobin.  I will see her back in 2 months to reassess these labs to see how well she responded to her upcoming  IV iron.  The patient understands all the plans discussed today and is in agreement with them.    Tanara Turvey Kirby Funk, MD

## 2024-03-06 ENCOUNTER — Inpatient Hospital Stay: Payer: Medicare Other | Attending: Oncology

## 2024-03-06 ENCOUNTER — Inpatient Hospital Stay (HOSPITAL_BASED_OUTPATIENT_CLINIC_OR_DEPARTMENT_OTHER): Payer: Medicare Other | Admitting: Oncology

## 2024-03-06 ENCOUNTER — Other Ambulatory Visit: Payer: Self-pay | Admitting: Oncology

## 2024-03-06 VITALS — BP 134/55 | HR 62 | Temp 98.1°F | Resp 14 | Ht 63.0 in | Wt 135.3 lb

## 2024-03-06 DIAGNOSIS — D631 Anemia in chronic kidney disease: Secondary | ICD-10-CM | POA: Diagnosis not present

## 2024-03-06 DIAGNOSIS — D509 Iron deficiency anemia, unspecified: Secondary | ICD-10-CM | POA: Insufficient documentation

## 2024-03-06 DIAGNOSIS — N189 Chronic kidney disease, unspecified: Secondary | ICD-10-CM | POA: Insufficient documentation

## 2024-03-06 DIAGNOSIS — N183 Chronic kidney disease, stage 3 unspecified: Secondary | ICD-10-CM

## 2024-03-06 DIAGNOSIS — D508 Other iron deficiency anemias: Secondary | ICD-10-CM | POA: Diagnosis not present

## 2024-03-06 LAB — CBC WITH DIFFERENTIAL (CANCER CENTER ONLY)
Abs Immature Granulocytes: 0.04 10*3/uL (ref 0.00–0.07)
Basophils Absolute: 0.1 10*3/uL (ref 0.0–0.1)
Basophils Relative: 1 %
Eosinophils Absolute: 0.3 10*3/uL (ref 0.0–0.5)
Eosinophils Relative: 5 %
HCT: 30.8 % — ABNORMAL LOW (ref 36.0–46.0)
Hemoglobin: 10 g/dL — ABNORMAL LOW (ref 12.0–15.0)
Immature Granulocytes: 1 %
Lymphocytes Relative: 23 %
Lymphs Abs: 1.6 10*3/uL (ref 0.7–4.0)
MCH: 30.1 pg (ref 26.0–34.0)
MCHC: 32.5 g/dL (ref 30.0–36.0)
MCV: 92.8 fL (ref 80.0–100.0)
Monocytes Absolute: 0.7 10*3/uL (ref 0.1–1.0)
Monocytes Relative: 10 %
Neutro Abs: 4.2 10*3/uL (ref 1.7–7.7)
Neutrophils Relative %: 60 %
Platelet Count: 191 10*3/uL (ref 150–400)
RBC: 3.32 MIL/uL — ABNORMAL LOW (ref 3.87–5.11)
RDW: 14.6 % (ref 11.5–15.5)
WBC Count: 6.9 10*3/uL (ref 4.0–10.5)
nRBC: 0 % (ref 0.0–0.2)
nRBC: 0 /100{WBCs}

## 2024-03-06 NOTE — Progress Notes (Signed)
 [facility]  [address] [place],  [place]  [address] [phone]  Clinic Day:  03/06/2024  Referring physician: Marylen Ponto, MD   HISTORY OF PRESENT ILLNESS:  The patient is a 78 y.o. female with anemia secondary to chronic renal insufficiency.   As she also had a downward trend in her iron parameters, she received IV iron in October 2024.  She comes in today to reassess her hemoglobin and iron levels.  Since her last visit, the patient has been doing well.  She denies having increased fatigue or any overt forms of blood loss which concern her for progressive anemia.    PHYSICAL EXAM:  Blood pressure (!) 134/55, pulse 62, temperature 98.1 F (36.7 C), temperature source Oral, resp. rate 14, height 5\' 3"  (1.6 m), weight 135 lb 4.8 oz (61.4 kg), SpO2 96%. Wt Readings from Last 3 Encounters:  03/06/24 135 lb 4.8 oz (61.4 kg)  02/15/24 135 lb (61.2 kg)  01/18/24 132 lb 8 oz (60.1 kg)   Body mass index is 23.97 kg/m. Performance status (ECOG): 1 - Symptomatic but completely ambulatory Physical Exam Constitutional:      Appearance: Normal appearance. She is not ill-appearing.  HENT:     Mouth/Throat:     Mouth: Mucous membranes are moist.     Pharynx: Oropharynx is clear. No oropharyngeal exudate or posterior oropharyngeal erythema.  Cardiovascular:     Rate and Rhythm: Normal rate and regular rhythm.     Heart sounds: No murmur heard.    No friction rub. No gallop.  Pulmonary:     Effort: Pulmonary effort is normal. No respiratory distress.     Breath sounds: Normal breath sounds. No wheezing, rhonchi or rales.  Abdominal:     General: Bowel sounds are normal. There is no distension.     Palpations: Abdomen is soft. There is no mass.     Tenderness: There is no abdominal tenderness.  Musculoskeletal:        General: No swelling.     Right lower leg: No edema.     Left lower leg: No edema.  Lymphadenopathy:     Cervical: No cervical  adenopathy.     Upper Body:     Right upper body: No supraclavicular or axillary adenopathy.     Left upper body: No supraclavicular or axillary adenopathy.     Lower Body: No right inguinal adenopathy. No left inguinal adenopathy.  Skin:    General: Skin is warm.     Coloration: Skin is not jaundiced.     Findings: No lesion or rash.  Neurological:     General: No focal deficit present.     Mental Status: She is alert and oriented to person, place, and time. Mental status is at baseline.  Psychiatric:        Mood and Affect: Mood normal.        Behavior: Behavior normal.        Thought Content: Thought content normal.     LABS:      Latest Ref Rng & Units 03/06/2024    9:11 AM 02/15/2024   10:32 AM 01/18/2024   10:47 AM  CBC  WBC 4.0 - 10.5 K/uL 6.9  6.4  8.8   Hemoglobin 12.0 - 15.0 g/dL 72.5  9.3  8.8   Hematocrit 36.0 - 46.0 % 30.8  27.5  26.6   Platelets 150 - 400 K/uL 191  169  176  Latest Ref Rng & Units 12/21/2023   10:42 AM 12/07/2023    9:44 AM 10/01/2023    8:50 AM  CMP  Glucose 70 - 99 mg/dL 528  413  244   BUN 8 - 23 mg/dL 37  30  28   Creatinine 0.44 - 1.00 mg/dL 0.10  2.72  5.36   Sodium 135 - 145 mmol/L 129  129  131   Potassium 3.5 - 5.1 mmol/L 5.0  5.2  4.3   Chloride 98 - 111 mmol/L 93  94  93   CO2 22 - 32 mmol/L 23  24  26    Calcium 8.9 - 10.3 mg/dL 9.8  9.8  9.7   Total Protein 6.5 - 8.1 g/dL 7.2  7.0  7.8   Total Bilirubin <1.2 mg/dL 0.3  0.4  0.7   Alkaline Phos 38 - 126 U/L 105  100  75   AST 15 - 41 U/L 15  15  15    ALT 0 - 44 U/L 13  5  14      Latest Reference Range & Units 02/15/24 10:33  Iron 28 - 170 ug/dL 57  UIBC ug/dL 644  TIBC 034 - 742 ug/dL 595  Saturation Ratios 10.4 - 31.8 % 22    Latest Reference Range & Units 01/18/24 10:47  Ferritin 11 - 307 ng/mL 524 (H)  (H): Data is abnormally high  ASSESSMENT & PLAN:  Assessment/Plan:  A 78 y.o. female with anemia secondary to chronic renal insufficiency, as well as iron  deficiency.  When evaluating her labs today, I am pleased with her hemoglobin of 10.  This reflects the benefits she received from recently receiving IV iron.  As her hemoglobin is at 10, red cell shot therapy will not be given today.  However, her CBC will be checked monthly, with Retacrit being given if it falls below 10.  I will see her back in 4 months for repeat clinical assessment.  The patient understands all the plans discussed today and is in agreement with them.    Nazarene Bunning Kirby Funk, MD

## 2024-03-14 ENCOUNTER — Inpatient Hospital Stay: Payer: Medicare Other

## 2024-03-14 ENCOUNTER — Inpatient Hospital Stay: Payer: Medicare Other | Admitting: Oncology

## 2024-03-17 DIAGNOSIS — I10 Essential (primary) hypertension: Secondary | ICD-10-CM | POA: Diagnosis not present

## 2024-03-17 DIAGNOSIS — G4700 Insomnia, unspecified: Secondary | ICD-10-CM | POA: Diagnosis not present

## 2024-03-17 DIAGNOSIS — D509 Iron deficiency anemia, unspecified: Secondary | ICD-10-CM | POA: Diagnosis not present

## 2024-03-17 DIAGNOSIS — R053 Chronic cough: Secondary | ICD-10-CM | POA: Diagnosis not present

## 2024-03-17 DIAGNOSIS — Z6824 Body mass index (BMI) 24.0-24.9, adult: Secondary | ICD-10-CM | POA: Diagnosis not present

## 2024-03-17 DIAGNOSIS — R059 Cough, unspecified: Secondary | ICD-10-CM | POA: Diagnosis not present

## 2024-03-21 ENCOUNTER — Telehealth: Payer: Self-pay | Admitting: Physician Assistant

## 2024-03-21 NOTE — Telephone Encounter (Signed)
   The patient called the answering service after-hours today requesting med refill. She did not answer the first time I called the primary callback number x2. I called mobile number, no answer. I called back main number again rang and then went to answering machine. [email or web address]. Husband listed at same number.  Laurann Montana, PA-C

## 2024-03-24 ENCOUNTER — Other Ambulatory Visit: Payer: Self-pay

## 2024-03-24 MED ORDER — NITROGLYCERIN 0.4 MG SL SUBL: SUBLINGUAL_TABLET | SUBLINGUAL | 3 refills | Status: AC

## 2024-03-24 NOTE — Telephone Encounter (Signed)
 Prescription sent to pharmacy.

## 2024-04-11 ENCOUNTER — Inpatient Hospital Stay: Attending: Oncology

## 2024-04-11 ENCOUNTER — Inpatient Hospital Stay

## 2024-04-11 VITALS — BP 127/50 | HR 74 | Temp 98.4°F | Resp 16 | Ht 63.0 in | Wt 135.8 lb

## 2024-04-11 DIAGNOSIS — D631 Anemia in chronic kidney disease: Secondary | ICD-10-CM

## 2024-04-11 DIAGNOSIS — N184 Chronic kidney disease, stage 4 (severe): Secondary | ICD-10-CM | POA: Insufficient documentation

## 2024-04-11 LAB — CBC WITH DIFFERENTIAL (CANCER CENTER ONLY)
Abs Immature Granulocytes: 0.06 10*3/uL (ref 0.00–0.07)
Basophils Absolute: 0.1 10*3/uL (ref 0.0–0.1)
Basophils Relative: 1 %
Eosinophils Absolute: 0.3 10*3/uL (ref 0.0–0.5)
Eosinophils Relative: 4 %
HCT: 27.5 % — ABNORMAL LOW (ref 36.0–46.0)
Hemoglobin: 9 g/dL — ABNORMAL LOW (ref 12.0–15.0)
Immature Granulocytes: 1 %
Lymphocytes Relative: 21 %
Lymphs Abs: 1.6 10*3/uL (ref 0.7–4.0)
MCH: 29.8 pg (ref 26.0–34.0)
MCHC: 32.7 g/dL (ref 30.0–36.0)
MCV: 91.1 fL (ref 80.0–100.0)
Monocytes Absolute: 0.6 10*3/uL (ref 0.1–1.0)
Monocytes Relative: 8 %
Neutro Abs: 5 10*3/uL (ref 1.7–7.7)
Neutrophils Relative %: 65 %
Platelet Count: 147 10*3/uL — ABNORMAL LOW (ref 150–400)
RBC: 3.02 MIL/uL — ABNORMAL LOW (ref 3.87–5.11)
RDW: 14.2 % (ref 11.5–15.5)
WBC Count: 7.7 10*3/uL (ref 4.0–10.5)
nRBC: 0 % (ref 0.0–0.2)
nRBC: 0 /100{WBCs}

## 2024-04-11 MED ORDER — EPOETIN ALFA-EPBX 40000 UNIT/ML IJ SOLN
40000.0000 [IU] | Freq: Once | INTRAMUSCULAR | Status: AC
  Administered 2024-04-11: 40000 [IU] via SUBCUTANEOUS
  Filled 2024-04-11: qty 1

## 2024-04-11 NOTE — Patient Instructions (Signed)
 Epoetin Alfa Injection What is this medication? EPOETIN ALFA (e POE e tin AL fa) treats low levels of red blood cells (anemia) caused by kidney disease, chemotherapy, or HIV medications. It can also be used in people who are at risk for blood loss during

## 2024-04-15 ENCOUNTER — Other Ambulatory Visit: Payer: Self-pay | Admitting: Cardiology

## 2024-04-15 NOTE — Telephone Encounter (Signed)
 Prescription sent to pharmacy.

## 2024-05-09 ENCOUNTER — Inpatient Hospital Stay

## 2024-05-09 ENCOUNTER — Inpatient Hospital Stay: Attending: Oncology

## 2024-05-09 VITALS — BP 137/65 | HR 76 | Temp 98.6°F | Resp 18

## 2024-05-09 DIAGNOSIS — D631 Anemia in chronic kidney disease: Secondary | ICD-10-CM

## 2024-05-09 DIAGNOSIS — N184 Chronic kidney disease, stage 4 (severe): Secondary | ICD-10-CM | POA: Diagnosis not present

## 2024-05-09 LAB — CBC WITH DIFFERENTIAL (CANCER CENTER ONLY)
Abs Immature Granulocytes: 0.03 K/uL (ref 0.00–0.07)
Basophils Absolute: 0.1 K/uL (ref 0.0–0.1)
Basophils Relative: 1 %
Eosinophils Absolute: 0.3 K/uL (ref 0.0–0.5)
Eosinophils Relative: 4 %
HCT: 27.8 % — ABNORMAL LOW (ref 36.0–46.0)
Hemoglobin: 9.3 g/dL — ABNORMAL LOW (ref 12.0–15.0)
Immature Granulocytes: 0 %
Lymphocytes Relative: 24 %
Lymphs Abs: 1.7 K/uL (ref 0.7–4.0)
MCH: 29.5 pg (ref 26.0–34.0)
MCHC: 33.5 g/dL (ref 30.0–36.0)
MCV: 88.3 fL (ref 80.0–100.0)
Monocytes Absolute: 0.5 K/uL (ref 0.1–1.0)
Monocytes Relative: 8 %
Neutro Abs: 4.4 K/uL (ref 1.7–7.7)
Neutrophils Relative %: 63 %
Platelet Count: 186 K/uL (ref 150–400)
RBC: 3.15 MIL/uL — ABNORMAL LOW (ref 3.87–5.11)
RDW: 14.7 % (ref 11.5–15.5)
WBC Count: 7 K/uL (ref 4.0–10.5)
nRBC: 0 % (ref 0.0–0.2)

## 2024-05-09 MED ORDER — EPOETIN ALFA-EPBX 40000 UNIT/ML IJ SOLN
40000.0000 [IU] | Freq: Once | INTRAMUSCULAR | Status: AC
  Administered 2024-05-09: 40000 [IU] via SUBCUTANEOUS
  Filled 2024-05-09: qty 1

## 2024-05-09 NOTE — Patient Instructions (Signed)

## 2024-05-16 DIAGNOSIS — Z87891 Personal history of nicotine dependence: Secondary | ICD-10-CM | POA: Diagnosis not present

## 2024-05-16 DIAGNOSIS — G43009 Migraine without aura, not intractable, without status migrainosus: Secondary | ICD-10-CM | POA: Diagnosis not present

## 2024-05-16 DIAGNOSIS — I2510 Atherosclerotic heart disease of native coronary artery without angina pectoris: Secondary | ICD-10-CM | POA: Diagnosis not present

## 2024-05-16 DIAGNOSIS — E785 Hyperlipidemia, unspecified: Secondary | ICD-10-CM | POA: Diagnosis not present

## 2024-05-16 DIAGNOSIS — Z955 Presence of coronary angioplasty implant and graft: Secondary | ICD-10-CM | POA: Diagnosis not present

## 2024-05-16 DIAGNOSIS — Z79899 Other long term (current) drug therapy: Secondary | ICD-10-CM | POA: Diagnosis not present

## 2024-05-16 DIAGNOSIS — I10 Essential (primary) hypertension: Secondary | ICD-10-CM | POA: Diagnosis not present

## 2024-05-16 DIAGNOSIS — Z880 Allergy status to penicillin: Secondary | ICD-10-CM | POA: Diagnosis not present

## 2024-05-16 DIAGNOSIS — H5712 Ocular pain, left eye: Secondary | ICD-10-CM | POA: Diagnosis not present

## 2024-05-26 ENCOUNTER — Encounter: Payer: Self-pay | Admitting: Oncology

## 2024-05-28 DIAGNOSIS — Z0000 Encounter for general adult medical examination without abnormal findings: Secondary | ICD-10-CM | POA: Diagnosis not present

## 2024-05-28 DIAGNOSIS — Z6824 Body mass index (BMI) 24.0-24.9, adult: Secondary | ICD-10-CM | POA: Diagnosis not present

## 2024-05-28 DIAGNOSIS — Z13220 Encounter for screening for lipoid disorders: Secondary | ICD-10-CM | POA: Diagnosis not present

## 2024-05-28 DIAGNOSIS — Z1339 Encounter for screening examination for other mental health and behavioral disorders: Secondary | ICD-10-CM | POA: Diagnosis not present

## 2024-05-28 DIAGNOSIS — Z1331 Encounter for screening for depression: Secondary | ICD-10-CM | POA: Diagnosis not present

## 2024-05-28 DIAGNOSIS — I10 Essential (primary) hypertension: Secondary | ICD-10-CM | POA: Diagnosis not present

## 2024-06-06 ENCOUNTER — Inpatient Hospital Stay

## 2024-06-06 ENCOUNTER — Inpatient Hospital Stay: Attending: Oncology

## 2024-06-06 VITALS — BP 145/68 | HR 69 | Temp 98.4°F | Resp 18

## 2024-06-06 DIAGNOSIS — D631 Anemia in chronic kidney disease: Secondary | ICD-10-CM

## 2024-06-06 DIAGNOSIS — N184 Chronic kidney disease, stage 4 (severe): Secondary | ICD-10-CM | POA: Insufficient documentation

## 2024-06-06 LAB — CBC WITH DIFFERENTIAL (CANCER CENTER ONLY)
Abs Immature Granulocytes: 0.05 10*3/uL (ref 0.00–0.07)
Basophils Absolute: 0.1 10*3/uL (ref 0.0–0.1)
Basophils Relative: 1 %
Eosinophils Absolute: 0.3 10*3/uL (ref 0.0–0.5)
Eosinophils Relative: 5 %
HCT: 30.8 % — ABNORMAL LOW (ref 36.0–46.0)
Hemoglobin: 9.8 g/dL — ABNORMAL LOW (ref 12.0–15.0)
Immature Granulocytes: 1 %
Lymphocytes Relative: 20 %
Lymphs Abs: 1.4 10*3/uL (ref 0.7–4.0)
MCH: 29.2 pg (ref 26.0–34.0)
MCHC: 31.8 g/dL (ref 30.0–36.0)
MCV: 91.7 fL (ref 80.0–100.0)
Monocytes Absolute: 0.6 10*3/uL (ref 0.1–1.0)
Monocytes Relative: 9 %
Neutro Abs: 4.5 10*3/uL (ref 1.7–7.7)
Neutrophils Relative %: 64 %
Platelet Count: 181 10*3/uL (ref 150–400)
RBC: 3.36 MIL/uL — ABNORMAL LOW (ref 3.87–5.11)
RDW: 14.1 % (ref 11.5–15.5)
WBC Count: 7 10*3/uL (ref 4.0–10.5)
nRBC: 0 % (ref 0.0–0.2)

## 2024-06-06 MED ORDER — EPOETIN ALFA-EPBX 40000 UNIT/ML IJ SOLN
40000.0000 [IU] | Freq: Once | INTRAMUSCULAR | Status: AC
  Administered 2024-06-06: 40000 [IU] via SUBCUTANEOUS
  Filled 2024-06-06: qty 1

## 2024-06-06 NOTE — Patient Instructions (Signed)

## 2024-07-04 ENCOUNTER — Ambulatory Visit: Admitting: Oncology

## 2024-07-04 ENCOUNTER — Ambulatory Visit

## 2024-07-04 ENCOUNTER — Other Ambulatory Visit

## 2024-07-09 NOTE — Progress Notes (Signed)
 [facility]  [address] [place],  [place]  [address] [phone]  Clinic Day:  07/10/2024  Referring physician: Ina Marcellus RAMAN, MD   HISTORY OF PRESENT ILLNESS:  The patient is a 78 y.o. female with anemia secondary to both chronic renal insufficiency and previous iron  deficiency.   She last received IV iron  in October 2024.  She has been receiving monthly Retacrit  injections with a goal to get her hemoglobin above 10.  She comes in today to reassess her hemoglobin and iron  levels.  Since her last visit, the patient has been doing well.  She denies having increased fatigue or any overt forms of blood loss which concern her for progressive anemia.    PHYSICAL EXAM:  There were no vitals taken for this visit. Wt Readings from Last 3 Encounters:  04/11/24 135 lb 12 oz (61.6 kg)  03/06/24 135 lb 4.8 oz (61.4 kg)  02/15/24 135 lb (61.2 kg)   There is no height or weight on file to calculate BMI. Performance status (ECOG): 1 - Symptomatic but completely ambulatory Physical Exam Constitutional:      Appearance: Normal appearance. She is not ill-appearing.  HENT:     Mouth/Throat:     Mouth: Mucous membranes are moist.     Pharynx: Oropharynx is clear. No oropharyngeal exudate or posterior oropharyngeal erythema.  Cardiovascular:     Rate and Rhythm: Normal rate and regular rhythm.     Heart sounds: No murmur heard.    No friction rub. No gallop.  Pulmonary:     Effort: Pulmonary effort is normal. No respiratory distress.     Breath sounds: Normal breath sounds. No wheezing, rhonchi or rales.  Abdominal:     General: Bowel sounds are normal. There is no distension.     Palpations: Abdomen is soft. There is no mass.     Tenderness: There is no abdominal tenderness.  Musculoskeletal:        General: No swelling.     Right lower leg: No edema.     Left lower leg: No edema.  Lymphadenopathy:     Cervical: No cervical adenopathy.     Upper Body:      Right upper body: No supraclavicular or axillary adenopathy.     Left upper body: No supraclavicular or axillary adenopathy.     Lower Body: No right inguinal adenopathy. No left inguinal adenopathy.  Skin:    General: Skin is warm.     Coloration: Skin is not jaundiced.     Findings: No lesion or rash.  Neurological:     General: No focal deficit present.     Mental Status: She is alert and oriented to person, place, and time. Mental status is at baseline.  Psychiatric:        Mood and Affect: Mood normal.        Behavior: Behavior normal.        Thought Content: Thought content normal.     LABS:      Latest Ref Rng & Units 07/10/2024    9:52 AM 06/06/2024    9:33 AM 05/09/2024    9:40 AM  CBC  WBC 4.0 - 10.5 K/uL 5.8  7.0  7.0   Hemoglobin 12.0 - 15.0 g/dL 9.2  9.8  9.3   Hematocrit 36.0 - 46.0 % 28.1  30.8  27.8   Platelets 150 - 400 K/uL 184  181  186       Latest Ref  Rng & Units 07/10/2024    9:52 AM 12/21/2023   10:42 AM 12/07/2023    9:44 AM  CMP  Glucose 70 - 99 mg/dL 892  892  886   BUN 8 - 23 mg/dL 18  37  30   Creatinine 0.44 - 1.00 mg/dL 8.29  7.60  8.35   Sodium 135 - 145 mmol/L 134  129  129   Potassium 3.5 - 5.1 mmol/L 4.8  5.0  5.2   Chloride 98 - 111 mmol/L 99  93  94   CO2 22 - 32 mmol/L 24  23  24    Calcium  8.9 - 10.3 mg/dL 9.0  9.8  9.8   Total Protein 6.5 - 8.1 g/dL 7.1  7.2  7.0   Total Bilirubin 0.0 - 1.2 mg/dL 0.3  0.3  0.4   Alkaline Phos 38 - 126 U/L 109  105  100   AST 15 - 41 U/L 22  15  15    ALT 0 - 44 U/L 13  13  5      Latest Reference Range & Units 07/10/24 09:52  Iron  28 - 170 ug/dL 67  UIBC ug/dL 817  TIBC 749 - 549 ug/dL 750 (L)  Saturation Ratios 10.4 - 31.8 % 27  Ferritin 11 - 307 ng/mL 207  (L): Data is abnormally low  ASSESSMENT & PLAN:  Assessment/Plan:  A 78 y.o. female with anemia secondary to chronic renal insufficiency, as well as iron  deficiency.  When evaluating her labs today, her hemoglobin is below 10.   However, her iron  parameters are normal.  Based upon this, she will continue to receive Retacrit  injections on a monthly basis to get her hemoglobin to/above 10.  Her CBC will be checked monthly, with Retacrit  being given if it remains below 10.  I will see her back in 4 months for repeat clinical assessment.  The patient understands all the plans discussed today and is in agreement with them.    Telly Broberg DELENA Kerns, MD

## 2024-07-10 ENCOUNTER — Inpatient Hospital Stay: Attending: Oncology

## 2024-07-10 ENCOUNTER — Other Ambulatory Visit: Payer: Self-pay

## 2024-07-10 ENCOUNTER — Telehealth: Payer: Self-pay

## 2024-07-10 ENCOUNTER — Inpatient Hospital Stay (HOSPITAL_BASED_OUTPATIENT_CLINIC_OR_DEPARTMENT_OTHER): Admitting: Oncology

## 2024-07-10 ENCOUNTER — Other Ambulatory Visit: Payer: Self-pay | Admitting: Oncology

## 2024-07-10 ENCOUNTER — Inpatient Hospital Stay

## 2024-07-10 ENCOUNTER — Telehealth: Payer: Self-pay | Admitting: Oncology

## 2024-07-10 DIAGNOSIS — D631 Anemia in chronic kidney disease: Secondary | ICD-10-CM | POA: Insufficient documentation

## 2024-07-10 DIAGNOSIS — N183 Chronic kidney disease, stage 3 unspecified: Secondary | ICD-10-CM

## 2024-07-10 DIAGNOSIS — N189 Chronic kidney disease, unspecified: Secondary | ICD-10-CM

## 2024-07-10 DIAGNOSIS — N184 Chronic kidney disease, stage 4 (severe): Secondary | ICD-10-CM | POA: Insufficient documentation

## 2024-07-10 LAB — CMP (CANCER CENTER ONLY)
ALT: 13 U/L (ref 0–44)
AST: 22 U/L (ref 15–41)
Albumin: 3.9 g/dL (ref 3.5–5.0)
Alkaline Phosphatase: 109 U/L (ref 38–126)
Anion gap: 11 (ref 5–15)
BUN: 18 mg/dL (ref 8–23)
CO2: 24 mmol/L (ref 22–32)
Calcium: 9 mg/dL (ref 8.9–10.3)
Chloride: 99 mmol/L (ref 98–111)
Creatinine: 1.7 mg/dL — ABNORMAL HIGH (ref 0.44–1.00)
GFR, Estimated: 31 mL/min — ABNORMAL LOW (ref >60)
Glucose, Bld: 107 mg/dL — ABNORMAL HIGH (ref 70–99)
Potassium: 4.8 mmol/L (ref 3.5–5.1)
Sodium: 134 mmol/L — ABNORMAL LOW (ref 135–145)
Total Bilirubin: 0.3 mg/dL (ref 0.0–1.2)
Total Protein: 7.1 g/dL (ref 6.5–8.1)

## 2024-07-10 LAB — CBC WITH DIFFERENTIAL (CANCER CENTER ONLY)
Abs Immature Granulocytes: 0.04 K/uL (ref 0.00–0.07)
Basophils Absolute: 0.1 K/uL (ref 0.0–0.1)
Basophils Relative: 1 %
Eosinophils Absolute: 0.3 K/uL (ref 0.0–0.5)
Eosinophils Relative: 5 %
HCT: 28.1 % — ABNORMAL LOW (ref 36.0–46.0)
Hemoglobin: 9.2 g/dL — ABNORMAL LOW (ref 12.0–15.0)
Immature Granulocytes: 1 %
Lymphocytes Relative: 29 %
Lymphs Abs: 1.7 K/uL (ref 0.7–4.0)
MCH: 30.7 pg (ref 26.0–34.0)
MCHC: 32.7 g/dL (ref 30.0–36.0)
MCV: 93.7 fL (ref 80.0–100.0)
Monocytes Absolute: 0.4 K/uL (ref 0.1–1.0)
Monocytes Relative: 8 %
Neutro Abs: 3.3 K/uL (ref 1.7–7.7)
Neutrophils Relative %: 56 %
Platelet Count: 184 K/uL (ref 150–400)
RBC: 3 MIL/uL — ABNORMAL LOW (ref 3.87–5.11)
RDW: 14.3 % (ref 11.5–15.5)
WBC Count: 5.8 K/uL (ref 4.0–10.5)
nRBC: 0 % (ref 0.0–0.2)

## 2024-07-10 LAB — IRON AND TIBC
Iron: 67 ug/dL (ref 28–170)
Saturation Ratios: 27 % (ref 10.4–31.8)
TIBC: 249 ug/dL — ABNORMAL LOW (ref 250–450)
UIBC: 182 ug/dL

## 2024-07-10 LAB — FERRITIN: Ferritin: 207 ng/mL (ref 11–307)

## 2024-07-10 NOTE — Telephone Encounter (Signed)
 Latest Reference Range & Units 07/10/24 09:52  Iron  28 - 170 ug/dL 67  UIBC ug/dL 817  TIBC 749 - 549 ug/dL 750 (L)  Saturation Ratios 10.4 - 31.8 % 27  Ferritin 11 - 307 ng/mL 207  (L): Data is abnormally low   Latest Reference Range & Units 07/10/24 09:52  Hemoglobin 12.0 - 15.0 g/dL 9.2 (L)  HCT 63.9 - 53.9 % 28.1 (L)  (L): Data is abnormally low

## 2024-07-10 NOTE — Telephone Encounter (Signed)
 Patient has been scheduled for follow-up visit per 07/10/24 LOS.  Pt given an appt calendar with date and time.

## 2024-07-11 ENCOUNTER — Inpatient Hospital Stay

## 2024-07-11 VITALS — BP 139/53 | HR 75 | Temp 98.6°F | Resp 18

## 2024-07-11 DIAGNOSIS — N184 Chronic kidney disease, stage 4 (severe): Secondary | ICD-10-CM | POA: Diagnosis not present

## 2024-07-11 DIAGNOSIS — D631 Anemia in chronic kidney disease: Secondary | ICD-10-CM

## 2024-07-11 MED ORDER — EPOETIN ALFA-EPBX 40000 UNIT/ML IJ SOLN
40000.0000 [IU] | Freq: Once | INTRAMUSCULAR | Status: AC
  Administered 2024-07-11: 40000 [IU] via SUBCUTANEOUS
  Filled 2024-07-11: qty 1

## 2024-07-11 NOTE — Patient Instructions (Signed)

## 2024-07-12 ENCOUNTER — Encounter: Payer: Self-pay | Admitting: Oncology

## 2024-08-07 ENCOUNTER — Inpatient Hospital Stay

## 2024-08-07 ENCOUNTER — Inpatient Hospital Stay: Attending: Oncology

## 2024-08-07 VITALS — BP 153/77 | HR 68 | Temp 98.3°F

## 2024-08-07 DIAGNOSIS — N184 Chronic kidney disease, stage 4 (severe): Secondary | ICD-10-CM | POA: Insufficient documentation

## 2024-08-07 DIAGNOSIS — N183 Chronic kidney disease, stage 3 unspecified: Secondary | ICD-10-CM

## 2024-08-07 DIAGNOSIS — D631 Anemia in chronic kidney disease: Secondary | ICD-10-CM | POA: Diagnosis not present

## 2024-08-07 DIAGNOSIS — L301 Dyshidrosis [pompholyx]: Secondary | ICD-10-CM | POA: Diagnosis not present

## 2024-08-07 DIAGNOSIS — L299 Pruritus, unspecified: Secondary | ICD-10-CM | POA: Diagnosis not present

## 2024-08-07 LAB — CBC WITH DIFFERENTIAL (CANCER CENTER ONLY)
Abs Immature Granulocytes: 0.04 K/uL (ref 0.00–0.07)
Basophils Absolute: 0.1 K/uL (ref 0.0–0.1)
Basophils Relative: 1 %
Eosinophils Absolute: 0.2 K/uL (ref 0.0–0.5)
Eosinophils Relative: 3 %
HCT: 30.7 % — ABNORMAL LOW (ref 36.0–46.0)
Hemoglobin: 10.4 g/dL — ABNORMAL LOW (ref 12.0–15.0)
Immature Granulocytes: 1 %
Lymphocytes Relative: 20 %
Lymphs Abs: 1.4 K/uL (ref 0.7–4.0)
MCH: 30.4 pg (ref 26.0–34.0)
MCHC: 33.9 g/dL (ref 30.0–36.0)
MCV: 89.8 fL (ref 80.0–100.0)
Monocytes Absolute: 0.4 K/uL (ref 0.1–1.0)
Monocytes Relative: 6 %
Neutro Abs: 5 K/uL (ref 1.7–7.7)
Neutrophils Relative %: 69 %
Platelet Count: 167 K/uL (ref 150–400)
RBC: 3.42 MIL/uL — ABNORMAL LOW (ref 3.87–5.11)
RDW: 14.6 % (ref 11.5–15.5)
WBC Count: 7.1 K/uL (ref 4.0–10.5)
nRBC: 0 % (ref 0.0–0.2)

## 2024-08-07 MED ORDER — EPOETIN ALFA-EPBX 40000 UNIT/ML IJ SOLN
40000.0000 [IU] | Freq: Once | INTRAMUSCULAR | Status: AC
  Administered 2024-08-07: 40000 [IU] via SUBCUTANEOUS
  Filled 2024-08-07: qty 1

## 2024-08-07 NOTE — Patient Instructions (Signed)

## 2024-08-09 ENCOUNTER — Other Ambulatory Visit: Payer: Self-pay | Admitting: Cardiology

## 2024-08-15 ENCOUNTER — Telehealth: Payer: Self-pay

## 2024-08-15 ENCOUNTER — Encounter: Payer: Self-pay | Admitting: Gastroenterology

## 2024-08-15 ENCOUNTER — Ambulatory Visit: Admitting: Gastroenterology

## 2024-08-15 VITALS — BP 104/52 | HR 55 | Ht 63.0 in | Wt 142.0 lb

## 2024-08-15 DIAGNOSIS — R143 Flatulence: Secondary | ICD-10-CM

## 2024-08-15 DIAGNOSIS — R194 Change in bowel habit: Secondary | ICD-10-CM | POA: Diagnosis not present

## 2024-08-15 DIAGNOSIS — K921 Melena: Secondary | ICD-10-CM | POA: Diagnosis not present

## 2024-08-15 DIAGNOSIS — Z860100 Personal history of colon polyps, unspecified: Secondary | ICD-10-CM

## 2024-08-15 DIAGNOSIS — D631 Anemia in chronic kidney disease: Secondary | ICD-10-CM

## 2024-08-15 DIAGNOSIS — N189 Chronic kidney disease, unspecified: Secondary | ICD-10-CM | POA: Diagnosis not present

## 2024-08-15 DIAGNOSIS — K219 Gastro-esophageal reflux disease without esophagitis: Secondary | ICD-10-CM | POA: Diagnosis not present

## 2024-08-15 DIAGNOSIS — D508 Other iron deficiency anemias: Secondary | ICD-10-CM

## 2024-08-15 MED ORDER — NA SULFATE-K SULFATE-MG SULF 17.5-3.13-1.6 GM/177ML PO SOLN: 1.0000 | Freq: Once | ORAL | 0 refills | Status: AC

## 2024-08-15 NOTE — Telephone Encounter (Signed)
 [facility] Pre-operative Risk Assessment     Request for surgical clearance:     Endoscopy Procedure  What type of surgery is being performed?     Endoscopic  When is this surgery scheduled?     10/21/24  What type of clearance is required ?   Pharmacy  Are there any medications that need to be held prior to surgery and how long? Plavix  for 5 days  Practice name and name of physician performing surgery?      [facility]  What is your office phone and fax number?      Phone- [phone]  Fax- [phone]  Anesthesia type (None, local, MAC, general) ?       MAC   Please route your response to Karna Louder, RMA

## 2024-08-15 NOTE — Progress Notes (Addendum)
 Chief Complaint:colon cancer screening, anemia Primary GI Doctor:Dr. Charlanne  HPI:  Patient is a  78  year old female patient with past medical history of With CAD s/p stent 10/18/2021 on ASA/plavix ,, HTN, HLD, CKD4, anemia of chronic disease, anxiety, migraines, insomnia, who was referred to me by Ina Marcellus RAMAN, MD on 05/30/24 for a evaluation of colon cancer screening .   Last seen in GI office by Dr. Charlanne on 11/11/21 for abd pain and diarrhea.  07/10/2024 patient seen by hematology/oncology for anemia/iron  deficiency.She last received IV iron  in October 2024.  She has been receiving monthly Retacrit  injections with a goal to get her hemoglobin above 10.  Reviewed entire note.  12/2023 last seen by cardiology, reviewed entire note.  Interval History    Patient presents to discuss colon screening colonoscopy and evaluation of iron  deficiency anemia.  Patient has had chronic anemia and iron  deficiency dating back to at least 2020, however she tells me as of recent her Hgb dropped below 10 and she has noted dark stools.  Patient receives IV iron  infusions intermittently from the hematology office. Patient notes she has intermittent dark stools, last episode a week ago.Patient has bowel movement daily but the consistency varies. She states her abdomen swells. She is taking OTC Mag oxide, which we discussed can be used for constipation. Patient complains of fatigue and lightheadedness.      Patient take pantoprazole  40 mg twice daily for GERD. She notes mild nausea, no vomiting.  No dysphagia. She notes some weight loss since December 2024 when she lost her husband. She reports she has good support with church.  No alcohol use. Former smoker, stopped in 1989  Patient taking Plavix  75mg  po daily  Surgical history: S/p cholecystectomy, appendicectomy, Total abdominal hysterectomy 1970  Patient's family history includes: mother colon CA age 31   Past GI procedures: Colonoscopy 10/21/2014 ([facility]):  Moderate predominantly sigmoid diverticulosis.  Small internal hemorrhoids with previous anorectal surgery.  No active bleeding.   EGD 09/2012: neg. Neg SB Bx for celiac.  CT AP: 10/2021:  No non-contrast CT findings of the abdomen or pelvis to explain left lower quadrant abdominal pain.   CT AP 05/2014: neg    Wt Readings from Last 3 Encounters:  08/15/24 142 lb (64.4 kg)  04/11/24 135 lb 12 oz (61.6 kg)  03/06/24 135 lb 4.8 oz (61.4 kg)    Past Medical History:  Diagnosis Date   Anemia 07/31/2017   Anemia in chronic kidney disease 10/03/2020   Anxiety and depression    CAD S/P percutaneous coronary angioplasty 10/18/2009   Carotid bruit 02/26/2016   Carotid bruit present 02/26/2016   Cavitary pneumonia 03/20/2019   Onset of symptoms around late  Feb 2020 > all symptoms resolved p 5 days Meropenem and rx x 2 weeks then changed to cleocin -  D/c cleocin 03/20/2019  With esr 12, wbc 7,800  -  Quant TB 03/20/2019  Neg  - 05/14/2019 cxr with minimal streaky residual, no as dz    Coronary artery disease involving native coronary artery 02/26/2016   Coronary artery disease involving native coronary artery of native heart with angina pectoris (HCC) 03/12/2016   Overview:  PCI and DES to LAD and RCA in 2003, cath 2010 wo restenosis   Essential hypertension 03/12/2016   GERD (gastroesophageal reflux disease)    High cholesterol 01/17/2017   History of colon polyps    Hx of diverticulitis of colon 09/11/2016   Hyperlipidemia 03/12/2016  Hyperlipidemia with target LDL less than 70 03/12/2016   Hypertension 01/27/2016   IBS (irritable bowel syndrome)    Insomnia 02/26/2016   Iron  deficiency anemia 10/01/2023   Migraine with aura 02/26/2016   Mitral valve prolapse 02/26/2016   Peripheral vascular disease (HCC) 02/26/2016   Persistent atrial fibrillation (HCC) 11/06/2019   Pneumonia of right upper lobe due to infectious organism 02/26/2019   Pre-op evaluation 11/19/2019   Rheumatoid  arthritis involving multiple joints (HCC) 02/26/2016   Rheumatoid arthritis involving multiple sites (HCC) 02/26/2016   Unstable angina (HCC) 10/18/2021    Past Surgical History:  Procedure Laterality Date   ABDOMINAL HYSTERECTOMY     CHOLECYSTECTOMY     COLONOSCOPY  10/21/2014   Moderate predominantly sigmod diverticulosis. Small internal hemorrhoids with evidence of previous anorectal surgery (no active bleeding)   CORONARY STENT INTERVENTION N/A 10/18/2021   Procedure: CORONARY STENT INTERVENTION;  Surgeon: Anner Alm ORN, MD;  Location: [facility] INVASIVE CV LAB;  Service: Cardiovascular;  Laterality: N/A;   ESOPHAGOGASTRODUODENOSCOPY  09/25/2012   Normal EGD   heart stents     x 4   LEFT HEART CATH AND CORONARY ANGIOGRAPHY N/A 10/18/2021   Procedure: LEFT HEART CATH AND CORONARY ANGIOGRAPHY;  Surgeon: Anner Alm ORN, MD;  Location: [facility] INVASIVE CV LAB;  Service: Cardiovascular;  Laterality: N/A;   rectal fissure      Current Outpatient Medications  Medication Sig Dispense Refill   allopurinol (ZYLOPRIM) 100 MG tablet Take 100 mg by mouth daily.     ALPRAZolam (XANAX) 0.5 MG tablet Take 0.5 mg by mouth as needed.     atorvastatin  (LIPITOR) 40 MG tablet Take 40 mg by mouth daily.     carvedilol  (COREG ) 3.125 MG tablet Take 3.125 mg by mouth 2 (two) times daily.     chlorthalidone (HYGROTON) 25 MG tablet Take 25 mg by mouth daily.     citalopram (CELEXA) 20 MG tablet Take 20 mg by mouth daily.     clopidogrel  (PLAVIX ) 75 MG tablet TAKE 1 TABLET(75 MG) BY MOUTH DAILY 90 tablet 2   isosorbide  mononitrate (IMDUR ) 30 MG 24 hr tablet TAKE 1 TABLET(30 MG) BY MOUTH DAILY 90 tablet 1   Magnesium  Oxide -Mg Supplement 200 MG TABS Take 1 tablet (200 mg total) by mouth daily. 90 tablet 0   Multiple Vitamins-Minerals (MULTIVITAMIN WITH MINERALS) tablet Take 1 tablet by mouth daily.     nitroGLYCERIN  (NITROSTAT ) 0.4 MG SL tablet ONE TABLET UNDER TONGUE AS NEEDED FOR CHEST PAIN EVERY 5 MINUTES 25  tablet 3   pantoprazole  (PROTONIX ) 40 MG tablet Take 40 mg by mouth 2 (two) times daily.     potassium chloride  SA (KLOR-CON ) 20 MEQ tablet Take 20 mEq by mouth 2 (two) times daily.     QUEtiapine (SEROQUEL) 100 MG tablet Take 100 mg by mouth at bedtime.     SSD 1 % cream Apply 1 Application topically 2 (two) times daily.     traMADol (ULTRAM) 50 MG tablet Take 50 mg by mouth 3 (three) times daily.     valsartan  (DIOVAN ) 40 MG tablet Take 1 tablet (40 mg total) by mouth daily. 90 tablet 3   No current facility-administered medications for this visit.    Allergies as of 08/15/2024 - Review Complete 08/15/2024  Allergen Reaction Noted   Penicillins Anaphylaxis 03/16/2016    Family History  Problem Relation Age of Onset   Heart attack Mother    Alzheimer's disease Father    Heart attack  Sister    Breast cancer Sister    Atrial fibrillation Sister    Stroke Sister    Heart attack Brother    Lung cancer Brother        smoked   Stroke Maternal Grandmother    Asthma Neg Hx    Colon cancer Neg Hx    Rectal cancer Neg Hx    Stomach cancer Neg Hx    Esophageal cancer Neg Hx    Pancreatic cancer Neg Hx    Liver cancer Neg Hx     Review of Systems:    Constitutional: No weight loss, fever, chills, weakness or fatigue HEENT: Eyes: No change in vision               Ears, Nose, Throat:  No change in hearing or congestion Skin: No rash or itching Cardiovascular: No chest pain, chest pressure or palpitations   Respiratory: No SOB or cough Gastrointestinal: See HPI and otherwise negative Genitourinary: No dysuria or change in urinary frequency Neurological: No headache, dizziness or syncope Musculoskeletal: No new muscle or joint pain Hematologic: No bleeding or bruising Psychiatric: No history of depression or anxiety    Physical Exam:  Vital signs: BP (!) 104/52   Pulse (!) 55   Ht 5' 3 (1.6 m)   Wt 142 lb (64.4 kg)   SpO2 95%   BMI 25.15 kg/m   Constitutional:    Pleasant female appears to be in NAD, Well developed, Well nourished, alert and cooperative Throat: Oral cavity and pharynx without inflammation, swelling or lesion.  Respiratory: Respirations even and unlabored. Lungs clear to auscultation bilaterally.   No wheezes, crackles, or rhonchi.  Cardiovascular: Normal S1, S2. Regular rate and rhythm. No peripheral edema, cyanosis or pallor.  Gastrointestinal:  Soft, nondistended, nontender. No rebound or guarding. Hypoactive bowel sounds. No appreciable masses or hepatomegaly. Rectal:  Not performed.  Msk:  Symmetrical without gross deformities. Without edema, no deformity or joint abnormality.  Neurologic:  Alert and  oriented x4;  grossly normal neurologically.  Skin:   Dry and intact without significant lesions or rashes.  RELEVANT LABS AND IMAGING: CBC    Latest Ref Rng & Units 08/07/2024    9:41 AM 07/10/2024    9:52 AM 06/06/2024    9:33 AM  CBC  WBC 4.0 - 10.5 K/uL 7.1  5.8  7.0   Hemoglobin 12.0 - 15.0 g/dL 89.5  9.2  9.8   Hematocrit 36.0 - 46.0 % 30.7  28.1  30.8   Platelets 150 - 400 K/uL 167  184  181      CMP     Latest Ref Rng & Units 07/10/2024    9:52 AM 12/21/2023   10:42 AM 12/07/2023    9:44 AM  CMP  Glucose 70 - 99 mg/dL 892  892  886   BUN 8 - 23 mg/dL 18  37  30   Creatinine 0.44 - 1.00 mg/dL 8.29  7.60  8.35   Sodium 135 - 145 mmol/L 134  129  129   Potassium 3.5 - 5.1 mmol/L 4.8  5.0  5.2   Chloride 98 - 111 mmol/L 99  93  94   CO2 22 - 32 mmol/L 24  23  24    Calcium  8.9 - 10.3 mg/dL 9.0  9.8  9.8   Total Protein 6.5 - 8.1 g/dL 7.1  7.2  7.0   Total Bilirubin 0.0 - 1.2 mg/dL 0.3  0.3  0.4   Alkaline  Phos 38 - 126 U/L 109  105  100   AST 15 - 41 U/L 22  15  15    ALT 0 - 44 U/L 13  13  5       Lab Results  Component Value Date   TSH 4.410 04/10/2023  04/2023 echo-  Left ventricular ejection fraction, by estimation, is 60 to 65%.   Assessment: Encounter Diagnoses  Name Primary?   Other iron  deficiency  anemia Yes   Melena    Altered bowel habits    Flatulence    Gastroesophageal reflux disease, unspecified whether esophagitis present    History of colon polyps    Anemia in chronic kidney disease, unspecified CKD stage     78 year old female patient who presents with worsening anemia/IDA and reported melena intermittently, last episode was about a week ago. She is watched closely by hematology and receives IV iron  prn. Most recent hemoglobin 10.4 which is improvement from 9.2 in July.  Her baseline is 8-10.  Patient is currently on PPI therapy twice daily.  Will go ahead and proceed with upper GI endoscopy to rule out gastritis, peptic ulcer disease, and/or H. pylori.   For the inconsistency in stools recommend patient follow high-fiber diet and we can reevaluate after procedures.    Patient also due for colon screening colonoscopy, last colonoscopy was 10 years ago and unremarkable.  Will go ahead and schedule colonoscopy in [facility] with Dr. Charlanne.  Will need cardiac clearance to hold Plavix  from cardiology.  Plan: - Recommend High fiber diet  - Reinforced GERD diet, no late meals 3-4 hours before lying down -Continue Pantoprazole  40 mg twice daily -continue follow-up with hematology -Schedule EGD in [facility] with Dr. Charlanne. The risks and benefits of EGD with possible biopsies and esophageal dilation were discussed with the patient who agrees to proceed. -Schedule for a colonoscopy in [facility] with Dr. Charlanne. The risks and benefits of colonoscopy with possible polypectomy / biopsies were discussed and the patient agrees to proceed.   - cardiac clearance to hold Plavix   Thank you for the courtesy of this consult. Please call me with any questions or concerns.   Aliahna Statzer, FNP-C [facility] 08/15/2024, 9:18 AM  Cc: Ina Marcellus RAMAN, MD

## 2024-08-15 NOTE — Telephone Encounter (Signed)
   Name: Cathleen Yagi  DOB: 10-Dec-1946  MRN: [id number]  Primary Cardiologist: Redell Leiter, MD   Preoperative team, please contact this patient and set up a phone call appointment for further preoperative risk assessment. Please obtain consent and complete medication review. Thank you for your help.  I confirm that guidance regarding antiplatelet and oral anticoagulation therapy has been completed and, if necessary, noted below.  Per office protocol, if patient is without any new symptoms or concerns at the time of their virtual visit, he/she may hold Plavix  for 5 days prior to procedure. Please resume Plavix  as soon as possible postprocedure, at the discretion of the surgeon.    I also confirmed the patient resides in the state of [place] . As per [facility] Medical Board telemedicine laws, the patient must reside in the state in which the provider is licensed.   Lamarr Satterfield, NP 08/15/2024, 10:14 AM [facility]

## 2024-08-15 NOTE — Patient Instructions (Addendum)
 Constipation Recommend high fiber diet  GERD Recommend GERD diet Continue Pantoprazole  twice daily   We have sent the following medications to your pharmacy for you to pick up at your convenience: SUPREP  You have been scheduled for an endoscopy and colonoscopy. Please follow the written instructions given to you at your visit today.  If you use inhalers (even only as needed), please bring them with you on the day of your procedure.  DO NOT TAKE 7 DAYS PRIOR TO TEST- Trulicity (dulaglutide) Ozempic, Wegovy (semaglutide) Mounjaro (tirzepatide) Bydureon Bcise (exanatide extended release)  DO NOT TAKE 1 DAY PRIOR TO YOUR TEST Rybelsus (semaglutide) Adlyxin (lixisenatide) Victoza (liraglutide) Byetta (exanatide) ___________________________________________________________________________  Due to recent changes in healthcare laws, you may see the results of your imaging and laboratory studies on MyChart before your provider has had a chance to review them.  We understand that in some cases there may be results that are confusing or concerning to you. Not all laboratory results come back in the same time frame and the provider may be waiting for multiple results in order to interpret others.  Please give us  48 hours in order for your provider to thoroughly review all the results before contacting the office for clarification of your results.   _______________________________________________________  If your blood pressure at your visit was 140/90 or greater, please contact your primary care physician to follow up on this.  _______________________________________________________  If you are age 58 or older, your body mass index should be between 23-30. Your Body mass index is 25.15 kg/m. If this is out of the aforementioned range listed, please consider follow up with your Primary Care Provider.  If you are age 44 or younger, your body mass index should be between 19-25. Your Body mass  index is 25.15 kg/m. If this is out of the aformentioned range listed, please consider follow up with your Primary Care Provider.   ________________________________________________________  The [facility] providers would like to encourage you to use MYCHART to communicate with providers for non-urgent requests or questions.  Due to long hold times on the telephone, sending your provider a message by [facility] may be a faster and more efficient way to get a response.  Please allow 48 business hours for a response.  Please remember that this is for non-urgent requests.  _______________________________________________________  [facility] is using a team-based approach to care.  Your team is made up of your doctor and two to three APPS. Our APPS (Nurse Practitioners and Physician Assistants) work with your physician to ensure care continuity for you. They are fully qualified to address your health concerns and develop a treatment plan. They communicate directly with your gastroenterologist to care for you. Seeing the Advanced Practice Practitioners on your physician's team can help you by facilitating care more promptly, often allowing for earlier appointments, access to diagnostic testing, procedures, and other specialty referrals.    Thank you for trusting me with your gastrointestinal care. Deanna May, FNP-C

## 2024-08-15 NOTE — Telephone Encounter (Signed)
 1st attempt to reach pt regarding surgical clearance and the need for an TELE appointment.  Left pt a detailed message to call back and get that scheduled.

## 2024-08-18 NOTE — Telephone Encounter (Signed)
 Pt returning call

## 2024-08-21 NOTE — Telephone Encounter (Signed)
 Pt has tele preop appt 10/01/24.

## 2024-08-25 DIAGNOSIS — I4891 Unspecified atrial fibrillation: Secondary | ICD-10-CM

## 2024-08-25 HISTORY — DX: Unspecified atrial fibrillation: I48.91

## 2024-09-03 DIAGNOSIS — R300 Dysuria: Secondary | ICD-10-CM | POA: Diagnosis not present

## 2024-09-03 DIAGNOSIS — N200 Calculus of kidney: Secondary | ICD-10-CM | POA: Diagnosis not present

## 2024-09-03 DIAGNOSIS — Z6824 Body mass index (BMI) 24.0-24.9, adult: Secondary | ICD-10-CM | POA: Diagnosis not present

## 2024-09-03 LAB — LAB REPORT - SCANNED: EGFR: 35

## 2024-09-04 ENCOUNTER — Inpatient Hospital Stay

## 2024-09-10 ENCOUNTER — Telehealth: Payer: Self-pay | Admitting: Cardiology

## 2024-09-10 DIAGNOSIS — Z880 Allergy status to penicillin: Secondary | ICD-10-CM | POA: Diagnosis not present

## 2024-09-10 DIAGNOSIS — R42 Dizziness and giddiness: Secondary | ICD-10-CM | POA: Diagnosis not present

## 2024-09-10 DIAGNOSIS — Z7902 Long term (current) use of antithrombotics/antiplatelets: Secondary | ICD-10-CM | POA: Diagnosis not present

## 2024-09-10 DIAGNOSIS — Z79899 Other long term (current) drug therapy: Secondary | ICD-10-CM | POA: Diagnosis not present

## 2024-09-10 DIAGNOSIS — Z87891 Personal history of nicotine dependence: Secondary | ICD-10-CM | POA: Diagnosis not present

## 2024-09-10 DIAGNOSIS — I509 Heart failure, unspecified: Secondary | ICD-10-CM | POA: Diagnosis not present

## 2024-09-10 DIAGNOSIS — E7800 Pure hypercholesterolemia, unspecified: Secondary | ICD-10-CM | POA: Diagnosis not present

## 2024-09-10 DIAGNOSIS — Z7901 Long term (current) use of anticoagulants: Secondary | ICD-10-CM | POA: Diagnosis not present

## 2024-09-10 DIAGNOSIS — I4891 Unspecified atrial fibrillation: Secondary | ICD-10-CM | POA: Diagnosis not present

## 2024-09-10 DIAGNOSIS — I110 Hypertensive heart disease with heart failure: Secondary | ICD-10-CM | POA: Diagnosis not present

## 2024-09-10 DIAGNOSIS — R079 Chest pain, unspecified: Secondary | ICD-10-CM | POA: Diagnosis not present

## 2024-09-10 DIAGNOSIS — R55 Syncope and collapse: Secondary | ICD-10-CM | POA: Diagnosis not present

## 2024-09-10 NOTE — Telephone Encounter (Signed)
 Caller (Dr. Larina) wants a consult regarding patient's new diagnosis of afib.

## 2024-09-10 NOTE — Telephone Encounter (Signed)
 Spoke with Dr. Krasowski and he stated that he had already spoke with Dr. Larina and answered all of his questions. Dr. Larina had no further questions at this time.

## 2024-09-11 ENCOUNTER — Inpatient Hospital Stay

## 2024-09-11 NOTE — Progress Notes (Signed)
 Cardiology Office Note:    Date:  09/14/2024   ID:  Angela Mccoy, DOB March 22, 1946, MRN [id number]  PCP:  Ina Marcellus RAMAN, MD   [facility] Providers Cardiologist:  Redell Leiter, MD     Referring MD: Ina Marcellus RAMAN, MD    History of Present Illness:    Angela Mccoy is a 78 y.o. female with a hx of CAD s/p PCI and DES to LAD and RCA in 2003, hypertension, mitral valve prolapse, PVD, paroxysmal atrial fibrillation, GERD, IBS, rheumatoid arthritis, HLD, anemia (followed at [facility] by Dr. Ezzard), CKD stage III.  Echo 05/09/23 EF 60-65%, grade I DD, TR mild to moderate MPI on 05/26/2021, low risk study. Renal ultrasound on 02/19/2020 without evidence of stenosis.  ZIO monitor in November 2020 to assess for palpitation burden, rare ventricular ectopy with isolated PVCs, no A-fib or flutter, 11 triggered events that were associated with rare atrial premature contractions.  Symptoms were felt to be related to her anemia.  Renal ultrasound on 02/19/2020 without evidence of stenosis.  10/19/2019 left heart cath two-vessel CAD, widely patent LAD stent with 10 to 20% in-stent restenosis; large dominant RCA with proximal stent that has distal edges leading to a native vessel with 85% irregular stenosis s/p DES PCI with overlapping DES  Longstanding patient of Dr. Leiter initially establishing with him in 2016 for the management of her CAD.  In 2020 she underwent left heart cath revealing two-vessel CAD, previously placed LAD stent was widely patent, PCI with DES in overlapping stents to her RCA.  She had a stress evaluation in 2020 which was a low risk study.  Most recently she was evaluated by Dr. Leiter on 01/15/2024, she was stable from a cardiac perspective, she was dealing with the recent loss of her husband which was understandably stressful.  Continue to follow with hematology regarding her chronic anemia, she was dealing with dizziness and  lightheadedness so her ARB was decreased and she was advised she could follow-up in 1 year.  Evaluated in the emergency department on 09/10/2024 with concerns of dizziness, she had also recently fallen secondary to this, she was dealing with loose stools and nausea, EKG revealed A-fib with RVR, she was given IV fluids and eventually converted back to sinus rhythm, prescribed Eliquis and advised to follow up outpatient with cardiology.   She presents today accompanied by her daughter for follow up after he ED evaluation with several concerns. Prior to going to the ED, she mentions she has two episodes of passing out for unknown cause/duration. She denies any prodromal symptoms. She has been dealing with anxiety, insomnia, and diarrhea. She is maintaining SR today. She denies chest pain, palpitations, dyspnea, pnd, orthopnea, n, v, dizziness, syncope, edema, weight gain, or early satiety.   Past Medical History:  Diagnosis Date   Anemia 07/31/2017   Anemia in chronic kidney disease 10/03/2020   Anxiety and depression    CAD S/P percutaneous coronary angioplasty 10/18/2009   Carotid bruit 02/26/2016   Carotid bruit present 02/26/2016   Cavitary pneumonia 03/20/2019   Onset of symptoms around late  Feb 2020 > all symptoms resolved p 5 days Meropenem and rx x 2 weeks then changed to cleocin -  D/c cleocin 03/20/2019  With esr 12, wbc 7,800  -  Quant TB 03/20/2019  Neg  - 05/14/2019 cxr with minimal streaky residual, no as dz    Coronary artery disease involving native coronary artery of native  heart with angina pectoris (HCC) 03/12/2016   Overview:  PCI and DES to LAD and RCA in 2003, cath 2010 wo restenosis   Essential hypertension 03/12/2016   GERD (gastroesophageal reflux disease)    High cholesterol 01/17/2017   History of colon polyps    Hx of diverticulitis of colon 09/11/2016   Hyperlipidemia 03/12/2016   Hyperlipidemia with target LDL less than 70 03/12/2016   IBS (irritable bowel syndrome)     Insomnia 02/26/2016   Iron  deficiency anemia 10/01/2023   Migraine with aura 02/26/2016   Mitral valve prolapse 02/26/2016   Peripheral vascular disease (HCC) 02/26/2016   Persistent atrial fibrillation (HCC) 11/06/2019   Pneumonia of right upper lobe due to infectious organism 02/26/2019   Pre-op evaluation 11/19/2019   Rheumatoid arthritis involving multiple joints (HCC) 02/26/2016   Unstable angina (HCC) 10/18/2021    Past Surgical History:  Procedure Laterality Date   ABDOMINAL HYSTERECTOMY     CHOLECYSTECTOMY     COLONOSCOPY  10/21/2014   Moderate predominantly sigmod diverticulosis. Small internal hemorrhoids with evidence of previous anorectal surgery (no active bleeding)   CORONARY STENT INTERVENTION N/A 10/18/2021   Procedure: CORONARY STENT INTERVENTION;  Surgeon: Anner Alm ORN, MD;  Location: [facility] INVASIVE CV LAB;  Service: Cardiovascular;  Laterality: N/A;   ESOPHAGOGASTRODUODENOSCOPY  09/25/2012   Normal EGD   heart stents     x 4   LEFT HEART CATH AND CORONARY ANGIOGRAPHY N/A 10/18/2021   Procedure: LEFT HEART CATH AND CORONARY ANGIOGRAPHY;  Surgeon: Anner Alm ORN, MD;  Location: [facility] INVASIVE CV LAB;  Service: Cardiovascular;  Laterality: N/A;   rectal fissure      Current Medications: Current Meds  Medication Sig   allopurinol (ZYLOPRIM) 100 MG tablet Take 100 mg by mouth daily.   ALPRAZolam (XANAX) 0.5 MG tablet Take 0.5 mg by mouth as directed. 1 at night, 1/2 in am as needed for anxiety   atorvastatin  (LIPITOR) 40 MG tablet Take 40 mg by mouth daily.   carvedilol  (COREG ) 6.25 MG tablet Take 6.25 mg by mouth 2 (two) times daily with a meal.   chlorthalidone (HYGROTON) 25 MG tablet Take 25 mg by mouth daily.   citalopram (CELEXA) 40 MG tablet Take 40 mg by mouth daily.   clopidogrel  (PLAVIX ) 75 MG tablet TAKE 1 TABLET(75 MG) BY MOUTH DAILY   diltiazem  (CARDIZEM ) 30 MG tablet Take 1 tablet (30 mg total) by mouth as directed. Take one tablet as needed for  HR > 110 BPM.   ELIQUIS 5 MG TABS tablet Take 5 mg by mouth 2 (two) times daily.   isosorbide  mononitrate (IMDUR ) 30 MG 24 hr tablet TAKE 1 TABLET(30 MG) BY MOUTH DAILY   Magnesium  Oxide -Mg Supplement 200 MG TABS Take 1 tablet (200 mg total) by mouth daily.   nitroGLYCERIN  (NITROSTAT ) 0.4 MG SL tablet ONE TABLET UNDER TONGUE AS NEEDED FOR CHEST PAIN EVERY 5 MINUTES   pantoprazole  (PROTONIX ) 40 MG tablet Take 40 mg by mouth 2 (two) times daily.   potassium chloride  SA (KLOR-CON ) 20 MEQ tablet Take 20 mEq by mouth 2 (two) times daily.   QUEtiapine Fumarate 150 MG TABS Take 1 tablet by mouth at bedtime.   SSD 1 % cream Apply 1 Application topically 2 (two) times daily.   valsartan  (DIOVAN ) 80 MG tablet Take 80 mg by mouth daily.     Allergies:   Penicillins   Social History   Socioeconomic History   Marital status: Married    Spouse  name: Not on file   Number of children: 2   Years of education: Not on file   Highest education level: Not on file  Occupational History   Occupation: Theme park manager  Tobacco Use   Smoking status: Former    Current packs/day: 0.00    Average packs/day: 1 pack/day for 18.0 years (18.0 ttl pk-yrs)    Types: Cigarettes    Start date: 12/25/1969    Quit date: 12/26/1987    Years since quitting: 36.7   Smokeless tobacco: Never  Vaping Use   Vaping status: Never Used  Substance and Sexual Activity   Alcohol use: No   Drug use: Never   Sexual activity: Not on file  Other Topics Concern   Not on file  Social History Narrative   Not on file   Social Drivers of Health   Financial Resource Strain: Not on file  Food Insecurity: Not on file  Transportation Needs: Not on file  Physical Activity: Not on file  Stress: Not on file  Social Connections: Not on file     Family History: The patient's family history includes Alzheimer's disease in her father; Atrial fibrillation in her sister; Breast cancer in her sister; Heart attack in her brother, mother,  and sister; Lung cancer in her brother; Stroke in her maternal grandmother and sister. There is no history of Asthma, Colon cancer, Rectal cancer, Stomach cancer, Esophageal cancer, Pancreatic cancer, or Liver cancer.  ROS:   Please see the history of present illness.    All other systems reviewed and are negative.  EKGs/Labs/Other Studies Reviewed:    The following studies were reviewed today:  Cardiac Studies & Procedures   ______________________________________________________________________________________________ CARDIAC CATHETERIZATION  CARDIAC CATHETERIZATION 10/18/2021  Conclusion   Ost RCA to Prox RCA stent is 10% in-stent restenosis.   CULPRIT LESION: Prox RCA to Mid RCA lesion is 85% stenosed just beyond the stent.   After scoring balloon angioplasty, A drug-eluting stent was successfully placed (overlapping previous stent) using a SYNERGY XD 2.75X28.  Postdilated to 3.1 mm   Post intervention, there is a 5% residual stenosis at the most significant segment, however the remainder has 0% visual stenosis.SABRA   ------------------------------   Mid LAD to Dist LAD previously placed stent is 10% stenosed (actually focally at small D2).   ------------------------------   The left ventricular systolic function is normal.  The left ventricular ejection fraction is 55-65% by visual estimate.   There is no aortic valve stenosis.  SUMMARY Two-vessel CAD: Widely patent LAD stent with maybe 10 to 15% ISR, otherwise normal LCA system with small nondominant LCx Large dominant RCA with proximal stent that has distal edge leading into a native vessel 85% irregular stenosis.  (CULPRIT LESION) Successful Scoring Balloon Angioplasty followed by DES PCI with an overlapping DES stent (2.75 mm x 28 mm postdilated to 3.1 mm) Normal LVEF and EDP.   RECOMMENDATIONS Stable for Same-Day PCI. Is already on Plavix  -> uninterrupted DAPT now for at least 6 months. Continue aggressive risk factor  modification.  Follow-up with Dr. Monetta.    Alm Clay, MD  Findings Coronary Findings Diagnostic  Dominance: Right  Left Anterior Descending Mid LAD to Dist LAD lesion is 10% stenosed. The lesion is focal. The lesion was previously treated using a stent (unknown type) over 2 years ago. Previously placed stent displays restenosis. The stenosis was measured by a visual reading.  Second Diagonal Branch Vessel is small in size.  Third Diagonal Branch Vessel is small  in size.  Left Circumflex Essentially courses almost to the ramus intermedius/high OM  Left Posterior Atrioventricular Artery Vessel is small in size.  Right Coronary Artery Vessel was injected. Vessel is normal in caliber. Ost RCA to Prox RCA lesion is 10% stenosed. The lesion was previously treated using a stent (unknown type) over 2 years ago. Previously placed stent displays restenosis. Prox RCA to Mid RCA lesion is 85% stenosed. The lesion is located proximal to the major branch, segmental and irregular. Tapers from 85% up to about 50%.  Acute Marginal Branch Vessel is small in size.  Right Ventricular Branch Vessel is small in size.  Right Posterior Atrioventricular Artery Vessel is large in size.  First Right Posterolateral Branch Vessel is small in size.  Second Right Posterolateral Branch Vessel is moderate in size.  Intervention  Prox RCA to Mid RCA lesion Stent Lesion length:  24 mm. CATH VISTA GUIDE 6FR JR4 guide catheter was inserted. Lesion crossed with guidewire using a WIRE ASAHI PROWATER 180CM. Pre-stent angioplasty was performed using a BALLN SCOREFLEX 2.50X15. Maximum pressure:  14 atm. Inflation time: 30 sec. BALLN SAPPHIRE 2.0X15 -> 12 ATM, 20 sec A drug-eluting stent was successfully placed using a SYNERGY XD 2.75X28. Maximum pressure: 18 atm. Inflation time: 30 sec. Stent strut is well apposed. At the original 85% lesion site there are 2 focal areas where there is maybe 10%  residual stenosis despite aggressive post dilation. Stent overlaps previously placed stent. Post-stent angioplasty was performed using a BALLN [place] EMERGE MR 3.0X20. Maximum pressure:  20 atm. Inflation time:  20 sec. Post-Intervention Lesion Assessment The intervention was successful. Pre-interventional TIMI flow is 3. Post-intervention TIMI flow is 3. Treated lesion length:  26 mm. No complications occurred at this lesion. There is a 5% residual stenosis post intervention.   STRESS TESTS  MYOCARDIAL PERFUSION IMAGING 05/26/2021  Interpretation Summary  The left ventricular ejection fraction is hyperdynamic (>65%).  Nuclear stress EF: 67%.  There was no ST segment deviation noted during stress.  No T wave inversion was noted during stress.  This is a low risk study.  The study is normal.   ECHOCARDIOGRAM  ECHOCARDIOGRAM COMPLETE 05/08/2023  Narrative ECHOCARDIOGRAM REPORT    Patient Name:   Angela Mccoy [facility] Date of Exam: 05/08/2023 Medical Rec #:  [id number]                 Height:       63.0 in Accession #:    [id number]                Weight:       142.5 lb Date of Birth:  09-04-1946                BSA:          1.674 m Patient Age:    76 years                  BP:           110/60 mmHg Patient Gender: F                         HR:           64 bpm. Exam Location:  [place]  Procedure: 2D Echo, Cardiac Doppler, Color Doppler and Strain Analysis  Indications:    Coronary artery disease involving native coronary artery of native heart with angina pectoris (HCC) [I25.119 (ICD-10-CM)]; Essential hypertension [I10 (  ICD-10-CM)]; Hyperlipidemia, unspecified hyperlipidemia type [E78.5 (ICD-10-CM)]; CKD (chronic kidney disease) stage 4, GFR 15-29 ml/min (HCC) [N18.4 (ICD-10-CM)]; Iron  deficiency anemia, unspecified iron  deficiency anemia type [D50.9 (ICD-10-CM)]; Mild tricuspid regurgitation by prior echocardiogram [I07.1 (ICD-10-CM)]; Other fatigue [R53.83  (ICD-10-CM)]; DOE (dyspnea on exertion) [R06.09 (ICD-10-CM)]  History:        Patient has prior history of Echocardiogram examinations, most recent 02/16/2019. CAD, Arrythmias:Atrial Fibrillation; Risk Factors:Dyslipidemia and Hypertension.  Sonographer:    Charlie Jointer RDCS Referring Phys: [phone] Aneri Slagel C [place]  IMPRESSIONS   1. Sigmoid septum. Left ventricular ejection fraction, by estimation, is 60 to 65%. The left ventricle has normal function. The left ventricle has no regional wall motion abnormalities. Left ventricular diastolic parameters are consistent with Grade I diastolic dysfunction (impaired relaxation). The average left ventricular global longitudinal strain is -17.2 %. The global longitudinal strain is abnormal. 2. Right ventricular systolic function is normal. The right ventricular size is normal. There is normal pulmonary artery systolic pressure. 3. The mitral valve is normal in structure. No evidence of mitral valve regurgitation. No evidence of mitral stenosis. 4. Tricuspid valve regurgitation is mild to moderate. 5. The aortic valve is normal in structure. Aortic valve regurgitation is not visualized. No aortic stenosis is present. 6. The inferior vena cava is normal in size with greater than 50% respiratory variability, suggesting right atrial pressure of 3 mmHg.  FINDINGS Left Ventricle: Sigmoid septum. Left ventricular ejection fraction, by estimation, is 60 to 65%. The left ventricle has normal function. The left ventricle has no regional wall motion abnormalities. The average left ventricular global longitudinal strain is -17.2 %. The global longitudinal strain is abnormal. The left ventricular internal cavity size was normal in size. There is no left ventricular hypertrophy. Left ventricular diastolic parameters are consistent with Grade I diastolic dysfunction (impaired relaxation).  Right Ventricle: The right ventricular size is normal. No increase in right  ventricular wall thickness. Right ventricular systolic function is normal. There is normal pulmonary artery systolic pressure. The tricuspid regurgitant velocity is 2.30 m/s, and with an assumed right atrial pressure of 3 mmHg, the estimated right ventricular systolic pressure is 24.2 mmHg.  Left Atrium: Left atrial size was normal in size.  Right Atrium: Right atrial size was normal in size.  Pericardium: There is no evidence of pericardial effusion.  Mitral Valve: The mitral valve is normal in structure. No evidence of mitral valve regurgitation. No evidence of mitral valve stenosis.  Tricuspid Valve: The tricuspid valve is normal in structure. Tricuspid valve regurgitation is mild to moderate. No evidence of tricuspid stenosis.  Aortic Valve: The aortic valve is normal in structure. Aortic valve regurgitation is not visualized. No aortic stenosis is present.  Pulmonic Valve: The pulmonic valve was normal in structure. Pulmonic valve regurgitation is trivial. No evidence of pulmonic stenosis.  Aorta: The aortic root is normal in size and structure.  Venous: The inferior vena cava is normal in size with greater than 50% respiratory variability, suggesting right atrial pressure of 3 mmHg.  IAS/Shunts: No atrial level shunt detected by color flow Doppler.   LEFT VENTRICLE PLAX 2D LVIDd:         4.10 cm   Diastology LVIDs:         2.60 cm   LV e' medial:    6.85 cm/s LV PW:         1.00 cm   LV E/e' medial:  10.6 LV IVS:        1.60 cm  LV e' lateral:   10.60 cm/s LVOT diam:     1.80 cm   LV E/e' lateral: 6.8 LVOT Area:     2.54 cm 2D Longitudinal Strain 2D Strain GLS Avg:     -17.2 %  RIGHT VENTRICLE             IVC RV Basal diam:  2.30 cm     IVC diam: 1.40 cm RV Mid diam:    1.50 cm RV S prime:     13.15 cm/s TAPSE (M-mode): 2.2 cm  LEFT ATRIUM             Index        RIGHT ATRIUM           Index LA diam:        3.80 cm 2.27 cm/m   RA Area:     12.00 cm LA Vol (A2C):    41.1 ml 24.55 ml/m  RA Volume:   26.20 ml  15.65 ml/m LA Vol (A4C):   53.9 ml 32.19 ml/m LA Biplane Vol: 48.1 ml 28.73 ml/m PULMONIC VALVE AORTA                 PR End Diast Vel: 4.49 msec Ao Root diam: 3.30 cm Ao Asc diam:  3.00 cm Ao Desc diam: 1.90 cm  MITRAL VALVE               TRICUSPID VALVE MV Area (PHT): 3.96 cm    TR Peak grad:   21.2 mmHg MV Decel Time: 192 msec    TR Vmax:        230.00 cm/s MV E velocity: 72.53 cm/s MV A velocity: 94.97 cm/s  SHUNTS MV E/A ratio:  0.76        Systemic Diam: 1.80 cm  Lamar Fitch MD Electronically signed by Lamar Fitch MD Signature Date/Time: 05/09/2023/8:50:36 AM    Final    MONITORS  LONG TERM MONITOR (3-14 DAYS) 11/24/2019  Narrative ZIO monitor was used for 3 days to assess palpitation in the setting of coronary artery disease.  The study initiated 11/06/2019.  The rhythm throughout was sinus with minimum, average and maximum heart rates of 46, 60 and 99 bpm.  There were no pauses of 3 seconds or greater or episodes of AV nodal or sinus node block.  Ventricular ectopy was rare with isolated PVCs  Supraventricular ectopy was rare with isolated APCs.  There was one 6 beat run of ectopic atrial rhythm noted asymptomatic.  There were no episodes of atrial fibrillation or flutter.  There are 11 triggered events 1 associated with rare atrial premature contraction.  There were 8 diary events of chest pain palpitation shortness of breath and lightheadedness 1 associated with rare atrial premature contraction   Conclusion unremarkable 3-day ZIO monitor triggered and diary events are generally unassociated with arrhythmia.       ______________________________________________________________________________________________       EKG:  EKG is not ordered today.   Recent Labs: 07/10/2024: ALT 13; BUN 18; Creatinine 1.70; Potassium 4.8; Sodium 134 08/07/2024: Hemoglobin 10.4; Platelet Count 167 09/12/2024: NT-Pro  BNP 1,184; TSH 1.740  Recent Lipid Panel    Component Value Date/Time   CHOL 120 01/15/2024 1348   TRIG 170 (H) 01/15/2024 1348   HDL 42 01/15/2024 1348   CHOLHDL 2.9 01/15/2024 1348   LDLCALC 50 01/15/2024 1348     Risk Assessment/Calculations:     Cardiac Studies & Procedures   ______________________________________________________________________________________________ CARDIAC CATHETERIZATION  CARDIAC CATHETERIZATION 10/18/2021  Conclusion   Ost RCA to Prox RCA stent is 10% in-stent restenosis.   CULPRIT LESION: Prox RCA to Mid RCA lesion is 85% stenosed just beyond the stent.   After scoring balloon angioplasty, A drug-eluting stent was successfully placed (overlapping previous stent) using a SYNERGY XD 2.75X28.  Postdilated to 3.1 mm   Post intervention, there is a 5% residual stenosis at the most significant segment, however the remainder has 0% visual stenosis.SABRA   ------------------------------   Mid LAD to Dist LAD previously placed stent is 10% stenosed (actually focally at small D2).   ------------------------------   The left ventricular systolic function is normal.  The left ventricular ejection fraction is 55-65% by visual estimate.   There is no aortic valve stenosis.  SUMMARY Two-vessel CAD: Widely patent LAD stent with maybe 10 to 15% ISR, otherwise normal LCA system with small nondominant LCx Large dominant RCA with proximal stent that has distal edge leading into a native vessel 85% irregular stenosis.  (CULPRIT LESION) Successful Scoring Balloon Angioplasty followed by DES PCI with an overlapping DES stent (2.75 mm x 28 mm postdilated to 3.1 mm) Normal LVEF and EDP.   RECOMMENDATIONS Stable for Same-Day PCI. Is already on Plavix  -> uninterrupted DAPT now for at least 6 months. Continue aggressive risk factor modification.  Follow-up with Dr. Monetta.    Alm Clay, MD  Findings Coronary Findings Diagnostic  Dominance: Right  Left Anterior  Descending Mid LAD to Dist LAD lesion is 10% stenosed. The lesion is focal. The lesion was previously treated using a stent (unknown type) over 2 years ago. Previously placed stent displays restenosis. The stenosis was measured by a visual reading.  Second Diagonal Branch Vessel is small in size.  Third Diagonal Branch Vessel is small in size.  Left Circumflex Essentially courses almost to the ramus intermedius/high OM  Left Posterior Atrioventricular Artery Vessel is small in size.  Right Coronary Artery Vessel was injected. Vessel is normal in caliber. Ost RCA to Prox RCA lesion is 10% stenosed. The lesion was previously treated using a stent (unknown type) over 2 years ago. Previously placed stent displays restenosis. Prox RCA to Mid RCA lesion is 85% stenosed. The lesion is located proximal to the major branch, segmental and irregular. Tapers from 85% up to about 50%.  Acute Marginal Branch Vessel is small in size.  Right Ventricular Branch Vessel is small in size.  Right Posterior Atrioventricular Artery Vessel is large in size.  First Right Posterolateral Branch Vessel is small in size.  Second Right Posterolateral Branch Vessel is moderate in size.  Intervention  Prox RCA to Mid RCA lesion Stent Lesion length:  24 mm. CATH VISTA GUIDE 6FR JR4 guide catheter was inserted. Lesion crossed with guidewire using a WIRE ASAHI PROWATER 180CM. Pre-stent angioplasty was performed using a BALLN SCOREFLEX 2.50X15. Maximum pressure:  14 atm. Inflation time: 30 sec. BALLN SAPPHIRE 2.0X15 -> 12 ATM, 20 sec A drug-eluting stent was successfully placed using a SYNERGY XD 2.75X28. Maximum pressure: 18 atm. Inflation time: 30 sec. Stent strut is well apposed. At the original 85% lesion site there are 2 focal areas where there is maybe 10% residual stenosis despite aggressive post dilation. Stent overlaps previously placed stent. Post-stent angioplasty was performed using a BALLN  EMERGE  MR 3.0X20. Maximum pressure:  20 atm. Inflation time:  20 sec. Post-Intervention Lesion Assessment The intervention was successful. Pre-interventional TIMI flow is 3. Post-intervention TIMI flow is 3. Treated lesion length:  26 mm. No complications occurred at this lesion. There is a 5% residual stenosis post intervention.   STRESS TESTS  MYOCARDIAL PERFUSION IMAGING 05/26/2021  Interpretation Summary  The left ventricular ejection fraction is hyperdynamic (>65%).  Nuclear stress EF: 67%.  There was no ST segment deviation noted during stress.  No T wave inversion was noted during stress.  This is a low risk study.  The study is normal.   ECHOCARDIOGRAM  ECHOCARDIOGRAM COMPLETE 05/08/2023  Narrative ECHOCARDIOGRAM REPORT    Patient Name:   Angela Mccoy [facility] Date of Exam: 05/08/2023 Medical Rec #:  [id number]                 Height:       63.0 in Accession #:    [id number]                Weight:       142.5 lb Date of Birth:  Aug 07, 1946                BSA:          1.674 m Patient Age:    76 years                  BP:           110/60 mmHg Patient Gender: F                         HR:           64 bpm. Exam Location:  [place]  Procedure: 2D Echo, Cardiac Doppler, Color Doppler and Strain Analysis  Indications:    Coronary artery disease involving native coronary artery of native heart with angina pectoris (HCC) [I25.119 (ICD-10-CM)]; Essential hypertension [I10 (ICD-10-CM)]; Hyperlipidemia, unspecified hyperlipidemia type [E78.5 (ICD-10-CM)]; CKD (chronic kidney disease) stage 4, GFR 15-29 ml/min (HCC) [N18.4 (ICD-10-CM)]; Iron  deficiency anemia, unspecified iron  deficiency anemia type [D50.9 (ICD-10-CM)]; Mild tricuspid regurgitation by prior echocardiogram [I07.1 (ICD-10-CM)]; Other fatigue [R53.83 (ICD-10-CM)]; DOE (dyspnea on exertion) [R06.09 (ICD-10-CM)]  History:        Patient has prior history of Echocardiogram examinations, most recent 02/16/2019.  CAD, Arrythmias:Atrial Fibrillation; Risk Factors:Dyslipidemia and Hypertension.  Sonographer:    Charlie Jointer RDCS Referring Phys: [phone] Nishka Heide C Taysean Wager  IMPRESSIONS   1. Sigmoid septum. Left ventricular ejection fraction, by estimation, is 60 to 65%. The left ventricle has normal function. The left ventricle has no regional wall motion abnormalities. Left ventricular diastolic parameters are consistent with Grade I diastolic dysfunction (impaired relaxation). The average left ventricular global longitudinal strain is -17.2 %. The global longitudinal strain is abnormal. 2. Right ventricular systolic function is normal. The right ventricular size is normal. There is normal pulmonary artery systolic pressure. 3. The mitral valve is normal in structure. No evidence of mitral valve regurgitation. No evidence of mitral stenosis. 4. Tricuspid valve regurgitation is mild to moderate. 5. The aortic valve is normal in structure. Aortic valve regurgitation is not visualized. No aortic stenosis is present. 6. The inferior vena cava is normal in size with greater than 50% respiratory variability, suggesting right atrial pressure of 3 mmHg.  FINDINGS Left Ventricle: Sigmoid septum. Left ventricular ejection fraction, by estimation, is 60 to 65%. The left ventricle has normal function. The left ventricle has no regional wall motion abnormalities. The average left ventricular global longitudinal strain is -17.2 %. The global longitudinal strain is abnormal. The left ventricular internal cavity size was normal in  size. There is no left ventricular hypertrophy. Left ventricular diastolic parameters are consistent with Grade I diastolic dysfunction (impaired relaxation).  Right Ventricle: The right ventricular size is normal. No increase in right ventricular wall thickness. Right ventricular systolic function is normal. There is normal pulmonary artery systolic pressure. The tricuspid regurgitant velocity  is 2.30 m/s, and with an assumed right atrial pressure of 3 mmHg, the estimated right ventricular systolic pressure is 24.2 mmHg.  Left Atrium: Left atrial size was normal in size.  Right Atrium: Right atrial size was normal in size.  Pericardium: There is no evidence of pericardial effusion.  Mitral Valve: The mitral valve is normal in structure. No evidence of mitral valve regurgitation. No evidence of mitral valve stenosis.  Tricuspid Valve: The tricuspid valve is normal in structure. Tricuspid valve regurgitation is mild to moderate. No evidence of tricuspid stenosis.  Aortic Valve: The aortic valve is normal in structure. Aortic valve regurgitation is not visualized. No aortic stenosis is present.  Pulmonic Valve: The pulmonic valve was normal in structure. Pulmonic valve regurgitation is trivial. No evidence of pulmonic stenosis.  Aorta: The aortic root is normal in size and structure.  Venous: The inferior vena cava is normal in size with greater than 50% respiratory variability, suggesting right atrial pressure of 3 mmHg.  IAS/Shunts: No atrial level shunt detected by color flow Doppler.   LEFT VENTRICLE PLAX 2D LVIDd:         4.10 cm   Diastology LVIDs:         2.60 cm   LV e' medial:    6.85 cm/s LV PW:         1.00 cm   LV E/e' medial:  10.6 LV IVS:        1.60 cm   LV e' lateral:   10.60 cm/s LVOT diam:     1.80 cm   LV E/e' lateral: 6.8 LVOT Area:     2.54 cm 2D Longitudinal Strain 2D Strain GLS Avg:     -17.2 %  RIGHT VENTRICLE             IVC RV Basal diam:  2.30 cm     IVC diam: 1.40 cm RV Mid diam:    1.50 cm RV S prime:     13.15 cm/s TAPSE (M-mode): 2.2 cm  LEFT ATRIUM             Index        RIGHT ATRIUM           Index LA diam:        3.80 cm 2.27 cm/m   RA Area:     12.00 cm LA Vol (A2C):   41.1 ml 24.55 ml/m  RA Volume:   26.20 ml  15.65 ml/m LA Vol (A4C):   53.9 ml 32.19 ml/m LA Biplane Vol: 48.1 ml 28.73 ml/m PULMONIC VALVE AORTA                  PR End Diast Vel: 4.49 msec Ao Root diam: 3.30 cm Ao Asc diam:  3.00 cm Ao Desc diam: 1.90 cm  MITRAL VALVE               TRICUSPID VALVE MV Area (PHT): 3.96 cm    TR Peak grad:   21.2 mmHg MV Decel Time: 192 msec    TR Vmax:        230.00 cm/s MV E velocity: 72.53 cm/s MV A velocity: 94.97 cm/s  SHUNTS MV E/A ratio:  0.76        Systemic Diam: 1.80 cm  Lamar Fitch MD Electronically signed by Lamar Fitch MD Signature Date/Time: 05/09/2023/8:50:36 AM    Final    MONITORS  LONG TERM MONITOR (3-14 DAYS) 11/24/2019  Narrative ZIO monitor was used for 3 days to assess palpitation in the setting of coronary artery disease.  The study initiated 11/06/2019.  The rhythm throughout was sinus with minimum, average and maximum heart rates of 46, 60 and 99 bpm.  There were no pauses of 3 seconds or greater or episodes of AV nodal or sinus node block.  Ventricular ectopy was rare with isolated PVCs  Supraventricular ectopy was rare with isolated APCs.  There was one 6 beat run of ectopic atrial rhythm noted asymptomatic.  There were no episodes of atrial fibrillation or flutter.  There are 11 triggered events 1 associated with rare atrial premature contraction.  There were 8 diary events of chest pain palpitation shortness of breath and lightheadedness 1 associated with rare atrial premature contraction   Conclusion unremarkable 3-day ZIO monitor triggered and diary events are generally unassociated with arrhythmia.       ______________________________________________________________________________________________      CHA2DS2-VASc Score = 5   This indicates a 7.2% annual risk of stroke. The patient's score is based upon: CHF History: 0 HTN History: 1 Diabetes History: 0 Stroke History: 0 Vascular Disease History: 1 Age Score: 2 Gender Score: 1           Physical Exam:    VS:  BP 130/80   Pulse 81   Ht 5' 3 (1.6 m)   Wt 135 lb (61.2 kg)    SpO2 93%   BMI 23.91 kg/m     Wt Readings from Last 3 Encounters:  09/12/24 135 lb (61.2 kg)  08/15/24 142 lb (64.4 kg)  04/11/24 135 lb 12 oz (61.6 kg)     GEN:  Well nourished, well developed in no acute distress HEENT: Normal NECK: No JVD; No carotid bruits LYMPHATICS: No lymphadenopathy CARDIAC: RRR, + murmur 2/6 LSB, rubs, gallops RESPIRATORY:  Clear to auscultation without rales, wheezing or rhonchi  ABDOMEN: Soft, non-tender, non-distended MUSCULOSKELETAL:  No edema; No deformity  SKIN: Warm and dry NEUROLOGIC:  Alert and oriented x 3 PSYCHIATRIC:  Normal affect   ASSESSMENT:    1. Coronary artery disease involving native coronary artery of native heart with angina pectoris (HCC)   2. Essential hypertension   3. PAF (paroxysmal atrial fibrillation) (HCC)   4. Hypercoagulable state (HCC)   5. Moderate tricuspid insufficiency   6. Syncope and collapse   7. Snoring   8. Encounter for vitamin deficiency screening   9. DOE (dyspnea on exertion)   10. CKD (chronic kidney disease) stage 4, GFR 15-29 ml/min (HCC)     EKG Interpretation Date/Time:  Friday September 12 2024 15:06:41 EDT Ventricular Rate:  81 PR Interval:  176 QRS Duration:  70 QT Interval:  346 QTC Calculation: 401 R Axis:   22  Text Interpretation: Normal sinus rhythm ST & T wave abnormality, consider inferior ischemia ST & T wave abnormality, consider anterolateral ischemia When compared with ECG of 17-Sep-2023 08:39, Premature atrial complexes are no longer Present T wave inversion now evident in Inferior leads T wave inversion now evident in Anterolateral leads Confirmed by Carlin Nest [phone]) on 09/12/2024 3:09:17 PM    PLAN:    In order of problems listed above:  CAD - s/p PCI  and DES to LAD and RCA in 2003; most recent LHC in 2022 revealed two-vessel CAD s/p DES PCI with an overlapping DES stent to RCA. Stable with no anginal symptoms. No indication for ischemic evaluation.  Continue  carvedilol  6.25 mg twice a day, Plavix  75 mg daily, Imdur  30 mg daily, pravastatin  40 mg daily, nitroglycerin  as needed--has not needed.  Syncope and collapse - two episodes, unknown duration, she denies any prodromal symptoms. Will arrange for a live monitor.   PAF/hypercoagulable state-CV score of 5, she is maintaining sinus rhythm. Continue Eliquis 5 mg twice daily -- Cr 1.2, age 83, wt 135 -- no indication for dose reduction. Continue Coreg  3.125 mg twice daily. Start Cardizem  30 mg PRN for AF and HR > 110 bpm PO x 1. Other lab work in ED unrevealing for contributory causes of AF, will check thyroid  panel.  Referral for OSA per below.   Hypertension - BP controlled at 130/80, bothered by intermittent dizziness, continue Diovan  40 mg daily, Coreg  3.125 mg twice daily.   Fatigue/DOE -likely multifactorial related to her chronic anemia, repeat echo was unrevealing as was other lab work.  ProBNP, echo, thyroid  panel.  Murmur/moderate TR -moderate TR per echo in May of this year. Repeat echo.   HLD -LDL on 10/10/2021 was 63, continue pravastatin  40 mg daily.   CKD with anemia -follows with the cancer for infusions, goal is hemoglobin greater than 10.  Follows with Dr. Ezzard. Dtr requests we check her vitamin D  level.   Snoring - dtr mentions she snores loudly, will refer to Pulmonology for OSA eval.     Disposition- Labs per above, 30 day monitor, echo, Cardizem  30 mg PO PRN for AF and HR> 110 bpm. 6 weeks with Dr. Monetta.      **I have spent > 40 minutes in direct patient care.    Medication Adjustments/Labs and Tests Ordered: Current medicines are reviewed at length with the patient today.  Concerns regarding medicines are outlined above.  Orders Placed This Encounter  Procedures   Thyroid  Panel With TSH   Pro b natriuretic peptide (BNP)   VITAMIN D  25 Hydroxy (Vit-D Deficiency, Fractures)   Ambulatory referral to Pulmonology   Cardiac event monitor   EKG 12-Lead   ECHOCARDIOGRAM  COMPLETE   Meds ordered this encounter  Medications   diltiazem  (CARDIZEM ) 30 MG tablet    Sig: Take 1 tablet (30 mg total) by mouth as directed. Take one tablet as needed for HR > 110 BPM.    Dispense:  90 tablet    Refill:  1    Supervising Provider:   BERNIE LAMAR PARAS [[id number]]    Patient Instructions  Medication Instructions:  Your physician has recommended you make the following change in your medication:   Take Cardizem  30 mg as needed for heart rate greater than 110.  *If you need a refill on your cardiac medications before your next appointment, please call your pharmacy*   Lab Work: Your physician recommends that you have a thyroid  panel, ProBNP and vitamin D  today in the office.  If you have labs (blood work) drawn today and your tests are completely normal, you will receive your results only by: MyChart Message (if you have MyChart) OR A paper copy in the mail If you have any lab test that is abnormal or we need to change your treatment, we will call you to review the results.  Testing/Procedures: Your physician has requested that you have an echocardiogram. Echocardiography is  a painless test that uses sound waves to create images of your heart. It provides your doctor with information about the size and shape of your heart and how well your heart's chambers and valves are working. This procedure takes approximately one hour. There are no restrictions for this procedure. Please do NOT wear cologne, perfume, aftershave, or lotions (deodorant is allowed). Please arrive 15 minutes prior to your appointment time.  Please note: We ask at that you not bring children with you during ultrasound (echo/ vascular) testing. Due to room size and safety concerns, children are not allowed in the ultrasound rooms during exams. Our front office staff cannot provide observation of children in our lobby area while testing is being conducted. An adult accompanying a patient to their  appointment will only be allowed in the ultrasound room at the discretion of the ultrasound technician under special circumstances. We apologize for any inconvenience.  Your physician has recommended that you wear an event monitor. Event monitors are medical devices that record the heart's electrical activity. Doctors most often us  these monitors to diagnose arrhythmias. Arrhythmias are problems with the speed or rhythm of the heartbeat. The monitor is a small, portable device. You can wear one while you do your normal daily activities. This is usually used to diagnose what is causing palpitations/syncope (passing out). This is a 30 day monitor.  Follow-Up: At [facility], you and your health needs are our priority.  As part of our continuing mission to provide you with exceptional heart care, we have created designated Provider Care Teams.  These Care Teams include your primary Cardiologist (physician) and Advanced Practice Providers (APPs -  Physician Assistants and Nurse Practitioners) who all work together to provide you with the care you need, when you need it.  We recommend signing up for the patient portal called MyChart.  Sign up information is provided on this After Visit Summary.  MyChart is used to connect with patients for Virtual Visits (Telemedicine).  Patients are able to view lab/test results, encounter notes, upcoming appointments, etc.  Non-urgent messages can be sent to your provider as well.   To learn more about what you can do with MyChart, go to [email or web address].    Your next appointment:   6 week(s)  The format for your next appointment:   In Person  Provider:   Redell Leiter, MD   Other Instructions Echocardiogram An echocardiogram is a test that uses sound waves (ultrasound) to produce images of the heart. Images from an echocardiogram can provide important information about: Heart size and shape. The size and thickness and movement of your heart's  walls. Heart muscle function and strength. Heart valve function or if you have stenosis. Stenosis is when the heart valves are too narrow. If blood is flowing backward through the heart valves (regurgitation). A tumor or infectious growth around the heart valves. Areas of heart muscle that are not working well because of poor blood flow or injury from a heart attack. Aneurysm detection. An aneurysm is a weak or damaged part of an artery wall. The wall bulges out from the normal force of blood pumping through the body. Tell a health care provider about: Any allergies you have. All medicines you are taking, including vitamins, herbs, eye drops, creams, and over-the-counter medicines. Any blood disorders you have. Any surgeries you have had. Any medical conditions you have. Whether you are pregnant or may be pregnant. What are the risks? Generally, this is a safe test. However, problems may  occur, including an allergic reaction to dye (contrast) that may be used during the test. What happens before the test? No specific preparation is needed. You may eat and drink normally. What happens during the test? You will take off your clothes from the waist up and put on a hospital gown. Electrodes or electrocardiogram (ECG)patches may be placed on your chest. The electrodes or patches are then connected to a device that monitors your heart rate and rhythm. You will lie down on a table for an ultrasound exam. A gel will be applied to your chest to help sound waves pass through your skin. A handheld device, called a transducer, will be pressed against your chest and moved over your heart. The transducer produces sound waves that travel to your heart and bounce back (or echo back) to the transducer. These sound waves will be captured in real-time and changed into images of your heart that can be viewed on a video monitor. The images will be recorded on a computer and reviewed by your health care  provider. You may be asked to change positions or hold your breath for a short time. This makes it easier to get different views or better views of your heart. In some cases, you may receive contrast through an IV in one of your veins. This can improve the quality of the pictures from your heart. The procedure may vary among health care providers and hospitals.   What can I expect after the test? You may return to your normal, everyday life, including diet, activities, and medicines, unless your health care provider tells you not to do that. Follow these instructions at home: It is up to you to get the results of your test. Ask your health care provider, or the department that is doing the test, when your results will be ready. Keep all follow-up visits. This is important. Summary An echocardiogram is a test that uses sound waves (ultrasound) to produce images of the heart. Images from an echocardiogram can provide important information about the size and shape of your heart, heart muscle function, heart valve function, and other possible heart problems. You do not need to do anything to prepare before this test. You may eat and drink normally. After the echocardiogram is completed, you may return to your normal, everyday life, unless your health care provider tells you not to do that. This information is not intended to replace advice given to you by your health care provider. Make sure you discuss any questions you have with your health care provider. Document Revised: 08/03/2020 Document Reviewed: 08/03/2020 Elsevier Patient Education  2021 [employer]   Important Information About Sugar          Signed, Delon JAYSON Hoover, NP  09/14/2024 3:26 PM    [facility]

## 2024-09-12 ENCOUNTER — Encounter: Payer: Self-pay | Admitting: Cardiology

## 2024-09-12 ENCOUNTER — Ambulatory Visit: Attending: Cardiology | Admitting: Cardiology

## 2024-09-12 ENCOUNTER — Ambulatory Visit

## 2024-09-12 ENCOUNTER — Other Ambulatory Visit: Payer: Self-pay | Admitting: Cardiology

## 2024-09-12 VITALS — BP 130/80 | HR 81 | Ht 63.0 in | Wt 135.0 lb

## 2024-09-12 DIAGNOSIS — D6859 Other primary thrombophilia: Secondary | ICD-10-CM | POA: Diagnosis present

## 2024-09-12 DIAGNOSIS — I480 Paroxysmal atrial fibrillation: Secondary | ICD-10-CM

## 2024-09-12 DIAGNOSIS — R0683 Snoring: Secondary | ICD-10-CM | POA: Diagnosis present

## 2024-09-12 DIAGNOSIS — N184 Chronic kidney disease, stage 4 (severe): Secondary | ICD-10-CM

## 2024-09-12 DIAGNOSIS — R55 Syncope and collapse: Secondary | ICD-10-CM

## 2024-09-12 DIAGNOSIS — R0609 Other forms of dyspnea: Secondary | ICD-10-CM

## 2024-09-12 DIAGNOSIS — I25119 Atherosclerotic heart disease of native coronary artery with unspecified angina pectoris: Secondary | ICD-10-CM | POA: Diagnosis present

## 2024-09-12 DIAGNOSIS — Z1321 Encounter for screening for nutritional disorder: Secondary | ICD-10-CM | POA: Diagnosis present

## 2024-09-12 DIAGNOSIS — I10 Essential (primary) hypertension: Secondary | ICD-10-CM

## 2024-09-12 DIAGNOSIS — I071 Rheumatic tricuspid insufficiency: Secondary | ICD-10-CM

## 2024-09-12 NOTE — Patient Instructions (Signed)
 Medication Instructions:  Your physician has recommended you make the following change in your medication:   Take Cardizem 30 mg as needed for heart rate greater than 110.  *If you need a refill on your cardiac medications before your next appointment, please call your pharmacy*   Lab Work: Your physician recommends that you have a thyroid  panel, ProBNP and vitamin D today in the office.  If you have labs (blood work) drawn today and your tests are completely normal, you will receive your results only by: MyChart Message (if you have MyChart) OR A paper copy in the mail If you have any lab test that is abnormal or we need to change your treatment, we will call you to review the results.  Testing/Procedures: Your physician has requested that you have an echocardiogram. Echocardiography is a painless test that uses sound waves to create images of your heart. It provides your doctor with information about the size and shape of your heart and how well your heart's chambers and valves are working. This procedure takes approximately one hour. There are no restrictions for this procedure. Please do NOT wear cologne, perfume, aftershave, or lotions (deodorant is allowed). Please arrive 15 minutes prior to your appointment time.  Please note: We ask at that you not bring children with you during ultrasound (echo/ vascular) testing. Due to room size and safety concerns, children are not allowed in the ultrasound rooms during exams. Our front office staff cannot provide observation of children in our lobby area while testing is being conducted. An adult accompanying a patient to their appointment will only be allowed in the ultrasound room at the discretion of the ultrasound technician under special circumstances. We apologize for any inconvenience.  Your physician has recommended that you wear an event monitor. Event monitors are medical devices that record the heart's electrical activity. Doctors most  often us  these monitors to diagnose arrhythmias. Arrhythmias are problems with the speed or rhythm of the heartbeat. The monitor is a small, portable device. You can wear one while you do your normal daily activities. This is usually used to diagnose what is causing palpitations/syncope (passing out). This is a 30 day monitor.  Follow-Up: At [facility], you and your health needs are our priority.  As part of our continuing mission to provide you with exceptional heart care, we have created designated Provider Care Teams.  These Care Teams include your primary Cardiologist (physician) and Advanced Practice Providers (APPs -  Physician Assistants and Nurse Practitioners) who all work together to provide you with the care you need, when you need it.  We recommend signing up for the patient portal called MyChart.  Sign up information is provided on this After Visit Summary.  MyChart is used to connect with patients for Virtual Visits (Telemedicine).  Patients are able to view lab/test results, encounter notes, upcoming appointments, etc.  Non-urgent messages can be sent to your provider as well.   To learn more about what you can do with MyChart, go to [email or web address].    Your next appointment:   6 week(s)  The format for your next appointment:   In Person  Provider:   Redell Leiter, MD   Other Instructions Echocardiogram An echocardiogram is a test that uses sound waves (ultrasound) to produce images of the heart. Images from an echocardiogram can provide important information about: Heart size and shape. The size and thickness and movement of your heart's walls. Heart muscle function and strength. Heart valve function or if  you have stenosis. Stenosis is when the heart valves are too narrow. If blood is flowing backward through the heart valves (regurgitation). A tumor or infectious growth around the heart valves. Areas of heart muscle that are not working well because of poor  blood flow or injury from a heart attack. Aneurysm detection. An aneurysm is a weak or damaged part of an artery wall. The wall bulges out from the normal force of blood pumping through the body. Tell a health care provider about: Any allergies you have. All medicines you are taking, including vitamins, herbs, eye drops, creams, and over-the-counter medicines. Any blood disorders you have. Any surgeries you have had. Any medical conditions you have. Whether you are pregnant or may be pregnant. What are the risks? Generally, this is a safe test. However, problems may occur, including an allergic reaction to dye (contrast) that may be used during the test. What happens before the test? No specific preparation is needed. You may eat and drink normally. What happens during the test? You will take off your clothes from the waist up and put on a hospital gown. Electrodes or electrocardiogram (ECG)patches may be placed on your chest. The electrodes or patches are then connected to a device that monitors your heart rate and rhythm. You will lie down on a table for an ultrasound exam. A gel will be applied to your chest to help sound waves pass through your skin. A handheld device, called a transducer, will be pressed against your chest and moved over your heart. The transducer produces sound waves that travel to your heart and bounce back (or echo back) to the transducer. These sound waves will be captured in real-time and changed into images of your heart that can be viewed on a video monitor. The images will be recorded on a computer and reviewed by your health care provider. You may be asked to change positions or hold your breath for a short time. This makes it easier to get different views or better views of your heart. In some cases, you may receive contrast through an IV in one of your veins. This can improve the quality of the pictures from your heart. The procedure may vary among health care  providers and hospitals.   What can I expect after the test? You may return to your normal, everyday life, including diet, activities, and medicines, unless your health care provider tells you not to do that. Follow these instructions at home: It is up to you to get the results of your test. Ask your health care provider, or the department that is doing the test, when your results will be ready. Keep all follow-up visits. This is important. Summary An echocardiogram is a test that uses sound waves (ultrasound) to produce images of the heart. Images from an echocardiogram can provide important information about the size and shape of your heart, heart muscle function, heart valve function, and other possible heart problems. You do not need to do anything to prepare before this test. You may eat and drink normally. After the echocardiogram is completed, you may return to your normal, everyday life, unless your health care provider tells you not to do that. This information is not intended to replace advice given to you by your health care provider. Make sure you discuss any questions you have with your health care provider. Document Revised: 08/03/2020 Document Reviewed: 08/03/2020 Elsevier Patient Education  2021 Elsevier Inc.   Important Information About Sugar

## 2024-09-13 ENCOUNTER — Telehealth: Payer: Self-pay

## 2024-09-13 LAB — PRO B NATRIURETIC PEPTIDE: NT-Pro BNP: 1184 pg/mL — ABNORMAL HIGH (ref 0–738)

## 2024-09-13 LAB — THYROID PANEL WITH TSH
Free Thyroxine Index: 1.5 (ref 1.2–4.9)
T3 Uptake Ratio: 28 % (ref 24–39)
T4, Total: 5.4 ug/dL (ref 4.5–12.0)
TSH: 1.74 u[IU]/mL (ref 0.450–4.500)

## 2024-09-13 LAB — VITAMIN D 25 HYDROXY (VIT D DEFICIENCY, FRACTURES): Vit D, 25-Hydroxy: 32.6 ng/mL (ref 30.0–100.0)

## 2024-09-13 NOTE — Telephone Encounter (Signed)
   Received page that patient was in a atrial fibrillation attack and her medication that was suppose to be filled yesterday had not been prescribed to her pharmacy.   Patient and her daughter both confirmed not having an atrial fibrillation episode. She denied lightheadedness/dizziness, chest pain, palpitations, and shortness of breath. Currently wearing a heart monitor. BP: 165/87 prior to am medications. HR 70-80s.  It was explained to me that the patient was nervous about not having the prn medication she was expecting to be prescribed after her visit yesterday with Delon Hoover, NP.  She was unsure of the medication name. At the time of the phone call, the office note was incomplete. I advised patient it would be unsafe for me to prescribe a new medication as I am not actively involved in her care. Patient and patient's daughter were in agreement that the safest thing to do was to wait for final recommendations, and that I would alert Delon Hoover, NP of this conversation to be seen during business hours.   Caller verbalized understanding and was grateful for the call back.  Leontine LOISE Salen, NP 09/13/2024, 1:32 PM

## 2024-09-14 ENCOUNTER — Ambulatory Visit: Payer: Self-pay | Admitting: Cardiology

## 2024-09-14 DIAGNOSIS — N184 Chronic kidney disease, stage 4 (severe): Secondary | ICD-10-CM

## 2024-09-14 MED ORDER — DILTIAZEM HCL 30 MG PO TABS: 30.0000 mg | ORAL_TABLET | ORAL | 1 refills | Status: DC

## 2024-09-23 MED ORDER — EMPAGLIFLOZIN 10 MG PO TABS: 10.0000 mg | ORAL_TABLET | Freq: Every day | ORAL | Status: DC

## 2024-09-23 NOTE — Telephone Encounter (Signed)
 Results reviewed with pt as per Wallis Bamberg NP's note.  Pt verbalized understanding and had no additional questions. Routed to PCP.

## 2024-09-27 DIAGNOSIS — R42 Dizziness and giddiness: Secondary | ICD-10-CM | POA: Diagnosis not present

## 2024-10-01 ENCOUNTER — Telehealth: Payer: Self-pay | Admitting: Pharmacist

## 2024-10-01 ENCOUNTER — Encounter: Payer: Self-pay | Admitting: Pharmacist

## 2024-10-01 ENCOUNTER — Ambulatory Visit: Attending: Cardiology

## 2024-10-01 DIAGNOSIS — Z01810 Encounter for preprocedural cardiovascular examination: Secondary | ICD-10-CM | POA: Diagnosis not present

## 2024-10-01 NOTE — Telephone Encounter (Signed)
 Patient with diagnosis of A Fib on Eliquis for anticoagulation.    Procedure: endoscopy Date of procedure: 10/21/24   CHA2DS2-VASc Score = 5  This indicates a 7.2% annual risk of stroke. The patient's score is based upon: CHF History: 0 HTN History: 1 Diabetes History: 0 Stroke History: 0 Vascular Disease History: 1 Age Score: 2 Gender Score: 1     CrCl 39 ml/min Platelet count 330K  Patient has not had an Afib/aflutter ablation in the last 3 months, DCCV within the last 4 weeks or a watchman implanted in the last 45 days    Per office protocol, patient can hold Eliquis for 2 days prior to procedure.    **This guidance is not considered finalized until pre-operative APP has relayed final recommendations.**

## 2024-10-01 NOTE — Progress Notes (Signed)
 Virtual Visit via Telephone Note   Because of Angela Mccoy co-morbid illnesses, she is at least at moderate risk for complications without adequate follow up.  This format is felt to be most appropriate for this patient at this time.  Due to technical limitations with video connection (technology), today's appointment will be conducted as an audio only telehealth visit, and Angela Mccoy verbally agreed to proceed in this manner.   All issues noted in this document were discussed and addressed.  No physical exam could be performed with this format.  Evaluation Performed:  Preoperative cardiovascular risk assessment _____________   Date:  10/01/2024   Patient ID:  Angela Mccoy, DOB 1946-12-19, MRN [id number] Patient Location:  Home Provider location:   Office  Primary Care Provider:  Ina Marcellus RAMAN, MD Primary Cardiologist:  Redell Leiter, MD  Chief Complaint / Patient Profile   78 y.o. y/o female with a h/o coronary artery disease, paroxysmal atrial fibrillation, hypertension who is pending endoscopy and presents today for telephonic preoperative cardiovascular risk assessment.  History of Present Illness    Angela Mccoy is a 78 y.o. female who presents via audio/video conferencing for a telehealth visit today.  Pt was last seen in cardiology clinic on 09/12/2024 by Angela Hoover, NP-C.  At that time Angela Mccoy was being evaluated for syncope.  The patient is now pending procedure as outlined above. Since her last visit, she has remained stable from a cardiac standpoint.  Today she denies chest pain, shortness of breath, lower extremity edema, fatigue, palpitations, melena, hematuria, hemoptysis, diaphoresis, weakness, presyncope, syncope, orthopnea, and PND.    Past Medical History    Past Medical History:  Diagnosis Date   Anemia 07/31/2017   Anemia in chronic kidney disease 10/03/2020   Anxiety and depression     CAD S/P percutaneous coronary angioplasty 10/18/2009   Carotid bruit 02/26/2016   Carotid bruit present 02/26/2016   Cavitary pneumonia 03/20/2019   Onset of symptoms around late  Feb 2020 > all symptoms resolved p 5 days Meropenem and rx x 2 weeks then changed to cleocin -  D/c cleocin 03/20/2019  With esr 12, wbc 7,800  -  Quant TB 03/20/2019  Neg  - 05/14/2019 cxr with minimal streaky residual, no as dz    Coronary artery disease involving native coronary artery of native heart with angina pectoris 03/12/2016   Overview:  PCI and DES to LAD and RCA in 2003, cath 2010 wo restenosis   Essential hypertension 03/12/2016   GERD (gastroesophageal reflux disease)    High cholesterol 01/17/2017   History of colon polyps    Hx of diverticulitis of colon 09/11/2016   Hyperlipidemia 03/12/2016   Hyperlipidemia with target LDL less than 70 03/12/2016   IBS (irritable bowel syndrome)    Insomnia 02/26/2016   Iron  deficiency anemia 10/01/2023   Migraine with aura 02/26/2016   Mitral valve prolapse 02/26/2016   Peripheral vascular disease 02/26/2016   Persistent atrial fibrillation (HCC) 11/06/2019   Pneumonia of right upper lobe due to infectious organism 02/26/2019   Pre-op evaluation 11/19/2019   Rheumatoid arthritis involving multiple joints (HCC) 02/26/2016   Unstable angina (HCC) 10/18/2021   Past Surgical History:  Procedure Laterality Date   ABDOMINAL HYSTERECTOMY     CHOLECYSTECTOMY     COLONOSCOPY  10/21/2014   Moderate predominantly sigmod diverticulosis. Small internal hemorrhoids with evidence of previous anorectal surgery (no active bleeding)   CORONARY STENT  INTERVENTION N/A 10/18/2021   Procedure: CORONARY STENT INTERVENTION;  Surgeon: Anner Alm ORN, MD;  Location: [facility] INVASIVE CV LAB;  Service: Cardiovascular;  Laterality: N/A;   ESOPHAGOGASTRODUODENOSCOPY  09/25/2012   Normal EGD   heart stents     x 4   LEFT HEART CATH AND CORONARY ANGIOGRAPHY N/A 10/18/2021    Procedure: LEFT HEART CATH AND CORONARY ANGIOGRAPHY;  Surgeon: Anner Alm ORN, MD;  Location: [facility] INVASIVE CV LAB;  Service: Cardiovascular;  Laterality: N/A;   rectal fissure      Allergies  Allergies  Allergen Reactions   Penicillins Anaphylaxis    Did it involve swelling of the face/tongue/throat, SOB, or low BP? Yes Did it involve sudden or severe rash/hives, skin peeling, or any reaction on the inside of your mouth or nose? No Did you need to seek medical attention at a hospital or doctor's office? Yes When did it last happen?      2012 If all above answers are "NO", may proceed with cephalosporin use.     Home Medications    Prior to Admission medications   Medication Sig Start Date End Date Taking? Authorizing Provider  allopurinol (ZYLOPRIM) 100 MG tablet Take 100 mg by mouth daily. 06/19/24   [provider]  ALPRAZolam (XANAX) 0.5 MG tablet Take 0.5 mg by mouth as directed. 1 at night, 1/2 in am as needed for anxiety    [provider]  atorvastatin  (LIPITOR) 40 MG tablet TAKE 1 TABLET(40 MG) BY MOUTH DAILY 09/15/24   Carlin Angela BROCKS, NP  carvedilol  (COREG ) 6.25 MG tablet Take 6.25 mg by mouth 2 (two) times daily with a meal.    [provider]  chlorthalidone (HYGROTON) 25 MG tablet Take 25 mg by mouth daily.    [provider]  citalopram (CELEXA) 40 MG tablet Take 40 mg by mouth daily.    [provider]  clopidogrel  (PLAVIX ) 75 MG tablet TAKE 1 TABLET(75 MG) BY MOUTH DAILY 04/15/24   Monetta Redell PARAS, MD  diltiazem  (CARDIZEM ) 30 MG tablet Take 1 tablet (30 mg total) by mouth as directed. Take one tablet as needed for HR > 110 BPM. 09/14/24   Carlin Angela BROCKS, NP  ELIQUIS 5 MG TABS tablet Take 5 mg by mouth 2 (two) times daily. 09/10/24   [provider]  empagliflozin (JARDIANCE) 10 MG TABS tablet Take 1 tablet (10 mg total) by mouth daily before breakfast. 09/23/24   Carlin Angela BROCKS, NP  isosorbide  mononitrate (IMDUR )  30 MG 24 hr tablet TAKE 1 TABLET(30 MG) BY MOUTH DAILY 08/11/24   Monetta Redell PARAS, MD  Magnesium  Oxide -Mg Supplement 200 MG TABS Take 1 tablet (200 mg total) by mouth daily. 04/17/23   Carlin Angela BROCKS, NP  nitroGLYCERIN  (NITROSTAT ) 0.4 MG SL tablet ONE TABLET UNDER TONGUE AS NEEDED FOR CHEST PAIN EVERY 5 MINUTES 03/24/24   Monetta Redell PARAS, MD  pantoprazole  (PROTONIX ) 40 MG tablet Take 40 mg by mouth 2 (two) times daily.    [provider]  potassium chloride  SA (KLOR-CON ) 20 MEQ tablet Take 20 mEq by mouth 2 (two) times daily.    [provider]  QUEtiapine Fumarate 150 MG TABS Take 1 tablet by mouth at bedtime. 09/07/24   [provider]  SSD 1 % cream Apply 1 Application topically 2 (two) times daily. 05/28/24   [provider]  valsartan  (DIOVAN ) 80 MG tablet Take 80 mg by mouth daily.    [provider]  Physical Exam    Vital Signs:  Angela Mccoy does not have vital signs available for review today.  Given telephonic nature of communication, physical exam is limited. AAOx3. NAD. Normal affect.  Speech and respirations are unlabored.  Accessory Clinical Findings    None  Assessment & Plan    1.  Preoperative Cardiovascular Risk Assessment: Endoscopy, 10/21/2024, [place] [facility], fax #[phone]      Primary Cardiologist: Redell Leiter, MD  Chart reviewed as part of pre-operative protocol coverage. Given past medical history and time since last visit, based on ACC/AHA guidelines, Angela Mccoy would be at acceptable risk for the planned procedure without further cardiovascular testing.   Patient was advised that if she develops new symptoms prior to surgery to contact our office to arrange a follow-up appointment.  She verbalized understanding.  Per office protocol, if patient is without any new symptoms or concerns at the time of their virtual visit, she may hold Plavix  for 5 days prior to procedure.  Please resume Plavix  as soon as possible postprocedure, at the discretion of the surgeon.   Patient has not had an Afib/aflutter ablation in the last 3 months, DCCV within the last 4 weeks or a watchman implanted in the last 45 days      Per office protocol, patient can hold Eliquis for 2 days prior to procedure.    Patient currently has a cardiac event monitor in place and has echocardiogram scheduled for 10/08/2024.  As long as diagnostic testing shows reassuring results patient may proceed with endoscopy procedure.  I will route this recommendation to the requesting party via Epic fax function and remove from pre-op pool.       Time:   Today, I have spent 5 minutes with the patient with telehealth technology discussing medical history, symptoms, and management plan.  I spent 10 minutes reviewing patient's past cardiac history and cardiac medications.    Angela CHRISTELLA Beauvais, NP  10/01/2024, 8:07 AM

## 2024-10-01 NOTE — Telephone Encounter (Signed)
 This encounter was created in error - please disregard.

## 2024-10-01 NOTE — Telephone Encounter (Signed)
-----   Message from Josefa CHRISTELLA Beauvais sent at 10/01/2024  8:16 AM EDT ----- Regarding: Eliquis recommendations Hey guys, I am also seeing this patient today who has not had recommendations for Eliquis.  Endoscopy, 10/21/2024, [place] [facility], fax #[phone]  Would you be able to review and make recommendations for Eliquis hold.  Thank you for your help.  Josefa CHRISTELLA. Cleaver NP-C  [image]   10/01/2024, 8:17 AM [facility] [address] [place], [place] [address] Office [phone]

## 2024-10-02 ENCOUNTER — Other Ambulatory Visit

## 2024-10-02 ENCOUNTER — Ambulatory Visit

## 2024-10-07 DIAGNOSIS — N184 Chronic kidney disease, stage 4 (severe): Secondary | ICD-10-CM | POA: Diagnosis not present

## 2024-10-08 ENCOUNTER — Ambulatory Visit: Attending: Cardiology

## 2024-10-08 DIAGNOSIS — R0609 Other forms of dyspnea: Secondary | ICD-10-CM | POA: Diagnosis not present

## 2024-10-08 LAB — BASIC METABOLIC PANEL WITH GFR
BUN/Creatinine Ratio: 18 (ref 12–28)
BUN: 26 mg/dL (ref 8–27)
CO2: 24 mmol/L (ref 20–29)
Calcium: 9.3 mg/dL (ref 8.7–10.3)
Chloride: 96 mmol/L (ref 96–106)
Creatinine, Ser: 1.45 mg/dL — ABNORMAL HIGH (ref 0.57–1.00)
Glucose: 103 mg/dL — ABNORMAL HIGH (ref 70–99)
Potassium: 5.2 mmol/L (ref 3.5–5.2)
Sodium: 133 mmol/L — ABNORMAL LOW (ref 134–144)
eGFR: 37 mL/min/1.73 — ABNORMAL LOW

## 2024-10-08 LAB — ECHOCARDIOGRAM COMPLETE
AR max vel: 1.88 cm2
AV Area VTI: 1.84 cm2
AV Area mean vel: 1.76 cm2
AV Mean grad: 4.5 mmHg
AV Peak grad: 7.7 mmHg
Ao pk vel: 1.39 m/s
Area-P 1/2: 3.33 cm2
MV VTI: 1.1 cm2
S' Lateral: 2.7 cm

## 2024-10-09 ENCOUNTER — Inpatient Hospital Stay

## 2024-10-09 ENCOUNTER — Inpatient Hospital Stay: Attending: Oncology

## 2024-10-09 VITALS — BP 131/62 | HR 58 | Temp 98.2°F | Resp 20

## 2024-10-09 DIAGNOSIS — Z23 Encounter for immunization: Secondary | ICD-10-CM

## 2024-10-09 DIAGNOSIS — N184 Chronic kidney disease, stage 4 (severe): Secondary | ICD-10-CM | POA: Diagnosis not present

## 2024-10-09 DIAGNOSIS — N183 Chronic kidney disease, stage 3 unspecified: Secondary | ICD-10-CM

## 2024-10-09 DIAGNOSIS — D631 Anemia in chronic kidney disease: Secondary | ICD-10-CM | POA: Diagnosis not present

## 2024-10-09 LAB — CBC WITH DIFFERENTIAL (CANCER CENTER ONLY)
Abs Immature Granulocytes: 0.04 K/uL (ref 0.00–0.07)
Basophils Absolute: 0.1 K/uL (ref 0.0–0.1)
Basophils Relative: 1 %
Eosinophils Absolute: 0.3 K/uL (ref 0.0–0.5)
Eosinophils Relative: 4 %
HCT: 26.6 % — ABNORMAL LOW (ref 36.0–46.0)
Hemoglobin: 8.7 g/dL — ABNORMAL LOW (ref 12.0–15.0)
Immature Granulocytes: 1 %
Lymphocytes Relative: 23 %
Lymphs Abs: 1.4 K/uL (ref 0.7–4.0)
MCH: 31.3 pg (ref 26.0–34.0)
MCHC: 32.7 g/dL (ref 30.0–36.0)
MCV: 95.7 fL (ref 80.0–100.0)
Monocytes Absolute: 0.6 K/uL (ref 0.1–1.0)
Monocytes Relative: 10 %
Neutro Abs: 3.5 K/uL (ref 1.7–7.7)
Neutrophils Relative %: 61 %
Platelet Count: 169 K/uL (ref 150–400)
RBC: 2.78 MIL/uL — ABNORMAL LOW (ref 3.87–5.11)
RDW: 14.2 % (ref 11.5–15.5)
WBC Count: 5.9 K/uL (ref 4.0–10.5)
nRBC: 0 % (ref 0.0–0.2)

## 2024-10-09 MED ORDER — INFLUENZA VAC SPLIT HIGH-DOSE 0.5 ML IM SUSY
0.5000 mL | PREFILLED_SYRINGE | Freq: Once | INTRAMUSCULAR | Status: AC
  Administered 2024-10-09: 0.5 mL via INTRAMUSCULAR
  Filled 2024-10-09: qty 0.5

## 2024-10-09 MED ORDER — EPOETIN ALFA-EPBX 40000 UNIT/ML IJ SOLN
40000.0000 [IU] | Freq: Once | INTRAMUSCULAR | Status: AC
  Administered 2024-10-09: 40000 [IU] via SUBCUTANEOUS
  Filled 2024-10-09: qty 1

## 2024-10-09 NOTE — Patient Instructions (Signed)

## 2024-10-13 ENCOUNTER — Telehealth: Payer: Self-pay | Admitting: Cardiology

## 2024-10-13 MED ORDER — ELIQUIS 5 MG PO TABS: 5.0000 mg | ORAL_TABLET | Freq: Two times a day (BID) | ORAL | 1 refills | Status: DC

## 2024-10-13 NOTE — Telephone Encounter (Signed)
 Prescription refill request for Eliquis received. Indication: A. FIB Last office visit: 09/12/24 Scr: 1.45 (EPIC 10/07/24) Age: 78 Weight: 61.2 kg  Will refill Eliquis per protocol.

## 2024-10-13 NOTE — Telephone Encounter (Signed)
*  STAT* If patient is at the pharmacy, call can be transferred to refill team.   1. Which medications need to be refilled? (please list name of each medication and dose if known) ELIQUIS 5 MG TABS tablet   2. Which pharmacy/location (including street and city if local pharmacy) is medication to be sent to? [facility] [phone] - [place], [place] - [address]   3. Do they need a 30 day or 90 day supply? 90

## 2024-10-15 ENCOUNTER — Telehealth: Payer: Self-pay | Admitting: Cardiology

## 2024-10-15 NOTE — Telephone Encounter (Signed)
 Gracie with [facility] states they received referral however they need it to include demographics, OV notes, and insurance. Please advise   Fax: [phone]

## 2024-10-20 NOTE — Telephone Encounter (Signed)
 Faxed and confirmation received.

## 2024-10-21 ENCOUNTER — Ambulatory Visit: Admitting: Gastroenterology

## 2024-10-21 ENCOUNTER — Other Ambulatory Visit: Payer: Self-pay

## 2024-10-21 ENCOUNTER — Other Ambulatory Visit (INDEPENDENT_AMBULATORY_CARE_PROVIDER_SITE_OTHER)

## 2024-10-21 ENCOUNTER — Ambulatory Visit: Payer: Self-pay | Admitting: Gastroenterology

## 2024-10-21 ENCOUNTER — Encounter: Payer: Self-pay | Admitting: Gastroenterology

## 2024-10-21 VITALS — BP 175/70 | HR 73 | Temp 98.1°F | Resp 18 | Ht 63.0 in | Wt 142.0 lb

## 2024-10-21 DIAGNOSIS — K31819 Angiodysplasia of stomach and duodenum without bleeding: Secondary | ICD-10-CM | POA: Diagnosis not present

## 2024-10-21 DIAGNOSIS — K2970 Gastritis, unspecified, without bleeding: Secondary | ICD-10-CM | POA: Diagnosis not present

## 2024-10-21 DIAGNOSIS — Z1211 Encounter for screening for malignant neoplasm of colon: Secondary | ICD-10-CM | POA: Diagnosis not present

## 2024-10-21 DIAGNOSIS — D508 Other iron deficiency anemias: Secondary | ICD-10-CM | POA: Diagnosis not present

## 2024-10-21 DIAGNOSIS — D631 Anemia in chronic kidney disease: Secondary | ICD-10-CM | POA: Diagnosis not present

## 2024-10-21 DIAGNOSIS — I2510 Atherosclerotic heart disease of native coronary artery without angina pectoris: Secondary | ICD-10-CM | POA: Diagnosis not present

## 2024-10-21 DIAGNOSIS — K640 First degree hemorrhoids: Secondary | ICD-10-CM

## 2024-10-21 DIAGNOSIS — N189 Chronic kidney disease, unspecified: Secondary | ICD-10-CM

## 2024-10-21 DIAGNOSIS — K5730 Diverticulosis of large intestine without perforation or abscess without bleeding: Secondary | ICD-10-CM | POA: Diagnosis not present

## 2024-10-21 DIAGNOSIS — K2950 Unspecified chronic gastritis without bleeding: Secondary | ICD-10-CM | POA: Diagnosis not present

## 2024-10-21 DIAGNOSIS — D509 Iron deficiency anemia, unspecified: Secondary | ICD-10-CM | POA: Diagnosis not present

## 2024-10-21 DIAGNOSIS — K2289 Other specified disease of esophagus: Secondary | ICD-10-CM

## 2024-10-21 DIAGNOSIS — K449 Diaphragmatic hernia without obstruction or gangrene: Secondary | ICD-10-CM | POA: Diagnosis not present

## 2024-10-21 DIAGNOSIS — K222 Esophageal obstruction: Secondary | ICD-10-CM | POA: Diagnosis not present

## 2024-10-21 DIAGNOSIS — F419 Anxiety disorder, unspecified: Secondary | ICD-10-CM | POA: Diagnosis not present

## 2024-10-21 DIAGNOSIS — K208 Other esophagitis without bleeding: Secondary | ICD-10-CM | POA: Diagnosis not present

## 2024-10-21 DIAGNOSIS — I10 Essential (primary) hypertension: Secondary | ICD-10-CM | POA: Diagnosis not present

## 2024-10-21 LAB — CBC WITH DIFFERENTIAL/PLATELET
Basophils Absolute: 0 K/uL (ref 0.0–0.1)
Basophils Relative: 0.7 % (ref 0.0–3.0)
Eosinophils Absolute: 0.1 K/uL (ref 0.0–0.7)
Eosinophils Relative: 0.9 % (ref 0.0–5.0)
HCT: 31 % — ABNORMAL LOW (ref 36.0–46.0)
Hemoglobin: 10.4 g/dL — ABNORMAL LOW (ref 12.0–15.0)
Lymphocytes Relative: 12.6 % (ref 12.0–46.0)
Lymphs Abs: 0.8 K/uL (ref 0.7–4.0)
MCHC: 33.6 g/dL (ref 30.0–36.0)
MCV: 92.2 fl (ref 78.0–100.0)
Monocytes Absolute: 0.3 K/uL (ref 0.1–1.0)
Monocytes Relative: 4.7 % (ref 3.0–12.0)
Neutro Abs: 5.1 K/uL (ref 1.4–7.7)
Neutrophils Relative %: 81.1 % — ABNORMAL HIGH (ref 43.0–77.0)
Platelets: 246 K/uL (ref 150.0–400.0)
RBC: 3.37 Mil/uL — ABNORMAL LOW (ref 3.87–5.11)
RDW: 15.9 % — ABNORMAL HIGH (ref 11.5–15.5)
WBC: 6.3 K/uL (ref 4.0–10.5)

## 2024-10-21 LAB — COMPREHENSIVE METABOLIC PANEL WITH GFR
ALT: 8 U/L (ref 0–35)
AST: 13 U/L (ref 0–37)
Albumin: 4.4 g/dL (ref 3.5–5.2)
Alkaline Phosphatase: 83 U/L (ref 39–117)
BUN: 13 mg/dL (ref 6–23)
CO2: 28 meq/L (ref 19–32)
Calcium: 9.4 mg/dL (ref 8.4–10.5)
Chloride: 95 meq/L — ABNORMAL LOW (ref 96–112)
Creatinine, Ser: 1.09 mg/dL (ref 0.40–1.20)
GFR: 49.17 mL/min — ABNORMAL LOW (ref >60.00)
Glucose, Bld: 121 mg/dL — ABNORMAL HIGH (ref 70–99)
Potassium: 3.6 meq/L (ref 3.5–5.1)
Sodium: 136 meq/L (ref 135–145)
Total Bilirubin: 0.6 mg/dL (ref 0.2–1.2)
Total Protein: 7.8 g/dL (ref 6.0–8.3)

## 2024-10-21 MED ORDER — SODIUM CHLORIDE 0.9 % IV SOLN: 500.0000 mL | INTRAVENOUS | Status: DC

## 2024-10-21 NOTE — Patient Instructions (Addendum)
 Continue present medications.  High fiber diet.  Await pathology results.   Resume Plavix  and Eliquis 10/23/2024. Discuss changing Plavix  to aspirin  with your provider.   YOU HAD AN ENDOSCOPIC PROCEDURE TODAY AT THE [facility]:   Refer to the procedure report that was given to you for any specific questions about what was found during the examination.  If the procedure report does not answer your questions, please call your gastroenterologist to clarify.  If you requested that your care partner not be given the details of your procedure findings, then the procedure report has been included in a sealed envelope for you to review at your convenience later.  YOU SHOULD EXPECT: Some feelings of bloating in the abdomen. Passage of more gas than usual.  Walking can help get rid of the air that was put into your GI tract during the procedure and reduce the bloating. If you had a lower endoscopy (such as a colonoscopy or flexible sigmoidoscopy) you may notice spotting of blood in your stool or on the toilet paper. If you underwent a bowel prep for your procedure, you may not have a normal bowel movement for a few days.  Please Note:  You might notice some irritation and congestion in your nose or some drainage.  This is from the oxygen used during your procedure.  There is no need for concern and it should clear up in a day or so.  SYMPTOMS TO REPORT IMMEDIATELY:  Following lower endoscopy (colonoscopy or flexible sigmoidoscopy):  Excessive amounts of blood in the stool  Significant tenderness or worsening of abdominal pains  Swelling of the abdomen that is new, acute  Fever of 100F or higher  Following upper endoscopy (EGD)  Vomiting of blood or coffee ground material  New chest pain or pain under the shoulder blades  Painful or persistently difficult swallowing  New shortness of breath  Fever of 100F or higher  Black, tarry-looking stools  For urgent or emergent issues, a  gastroenterologist can be reached at any hour by calling [phone]. Do not use MyChart messaging for urgent concerns.    DIET:  We do recommend a small meal at first, but then you may proceed to your regular diet.  Drink plenty of fluids but you should avoid alcoholic beverages for 24 hours.  ACTIVITY:  You should plan to take it easy for the rest of today and you should NOT DRIVE or use heavy machinery until tomorrow (because of the sedation medicines used during the test).    FOLLOW UP: Our staff will call the number listed on your records the next business day following your procedure.  We will call around 7:15- 8:00 am to check on you and address any questions or concerns that you may have regarding the information given to you following your procedure. If we do not reach you, we will leave a message.     If any biopsies were taken you will be contacted by phone or by letter within the next 1-3 weeks.  Please call us  at [phone] if you have not heard about the biopsies in 3 weeks.    SIGNATURES/CONFIDENTIALITY: You and/or your care partner have signed paperwork which will be entered into your electronic medical record.  These signatures attest to the fact that that the information above on your After Visit Summary has been reviewed and is understood.  Full responsibility of the confidentiality of this discharge information lies with you and/or your care-partner.

## 2024-10-21 NOTE — Progress Notes (Unsigned)
 Vss nad trans to pacu

## 2024-10-21 NOTE — Progress Notes (Unsigned)
 Called to room to assist during endoscopic procedure.  Patient ID and intended procedure confirmed with present staff. Received instructions for my participation in the procedure from the performing physician.

## 2024-10-21 NOTE — Op Note (Signed)
 [facility] Patient Name: Angela Mccoy Procedure Date: 10/21/2024 8:28 AM MRN: [id number] Endoscopist: Lynnie Bring , MD, [id number] Age: 78 Referring MD:  Date of Birth: 28-Jun-1946 Gender: Female Account #: [id number] Procedure:                Upper GI endoscopy Indications:              Iron  deficiency anemia with heme positive stools. Medicines:                Monitored Anesthesia Care Procedure:                Pre-Anesthesia Assessment:                           - Prior to the procedure, a History and Physical                            was performed, and patient medications and                            allergies were reviewed. The patient's tolerance of                            previous anesthesia was also reviewed. The risks                            and benefits of the procedure and the sedation                            options and risks were discussed with the patient.                            All questions were answered, and informed consent                            was obtained. Prior Anticoagulants: Has been on                            Plavix  and Eliquis. ASA Grade Assessment: III - A                            patient with severe systemic disease. After                            reviewing the risks and benefits, the patient was                            deemed in satisfactory condition to undergo the                            procedure.                           After obtaining informed consent, the endoscope was  passed under direct vision. Throughout the                            procedure, the patient's blood pressure, pulse, and                            oxygen saturations were monitored continuously. The                            Olympus Scope [id number] was introduced through the                            mouth, and advanced to the second part of duodenum.                            The upper GI endoscopy was  accomplished without                            difficulty. The patient tolerated the procedure                            well. Scope In: Scope Out: Findings:                 The examined esophagus was normal with mildly                            irregular Z-line at 38 cm. Examined by NBI. Few                            biopsies were obtained and sent for histology..                           A mild Schatzki ring was found at the                            gastroesophageal junction.                           A 2 cm hiatal hernia was present.                           Localized mild inflammation characterized by                            congestion (edema) and erythema was found in the                            entire examined stomach. Biopsies were taken with a                            cold forceps for histology.                           The examined duodenum was normal. Biopsies for  histology were taken with a cold forceps for                            evaluation of celiac disease. A small nonbleeding                            AVM was noted in the second portion of the                            duodenum. Not ablated since APC was not available. Complications:            No immediate complications. Estimated Blood Loss:     Estimated blood loss: none. Impression:               - Small hiatal hernia.                           - Gastritis. Biopsied.                           - Small duodenal AVM. Not ablated since APC is not                            available in [facility] Recommendation:           - Patient has a contact number available for                            emergencies. The signs and symptoms of potential                            delayed complications were discussed with the                            patient. Return to normal activities tomorrow.                            Written discharge instructions were provided to the                             patient.                           - Resume previous diet.                           - Continue present medications.                           - Await pathology results.                           - Proceed with colonoscopy.                           - Check CBC, CMP today                           -  Continue IV iron  supplementation.                           - Would recommend CT Abdo/pelvis with contrast for                            persistent IDA. If still with problems, would                            consider push enteroscopy at [facility].                           - Can consider changing Plavix  to baby aspirin  or                            monotherapy with Eliquis alone. Will forward this                            note to Dr. Ina.                           - The findings and recommendations were discussed                            with the patient's family. Lynnie Bring, MD 10/21/2024 9:23:30 AM This report has been signed electronically.

## 2024-10-21 NOTE — Op Note (Signed)
 [facility] Patient Name: Angela Mccoy Procedure Date: 10/21/2024 8:27 AM MRN: [id number] Endoscopist: Lynnie Bring , MD, [id number] Age: 78 Referring MD:  Date of Birth: Aug 02, 1946 Gender: Female Account #: [id number] Procedure:                Colonoscopy Indications:              Iron  deficiency anemia secondary to chronic blood                            loss Medicines:                Monitored Anesthesia Care Procedure:                Pre-Anesthesia Assessment:                           - Prior to the procedure, a History and Physical                            was performed, and patient medications and                            allergies were reviewed. The patient's tolerance of                            previous anesthesia was also reviewed. The risks                            and benefits of the procedure and the sedation                            options and risks were discussed with the patient.                            All questions were answered, and informed consent                            was obtained. Prior Anticoagulants: The patient has                            taken Plavix  (clopidogrel ), last dose was 5 days                            prior to procedure. Also on Eliquis last dose 2                            days ago. ASA Grade Assessment: III - A patient                            with severe systemic disease. After reviewing the                            risks and benefits, the patient was deemed in  satisfactory condition to undergo the procedure.                           After obtaining informed consent, the colonoscope                            was passed under direct vision. Throughout the                            procedure, the patient's blood pressure, pulse, and                            oxygen saturations were monitored continuously. The                            [id number]-[id number] Colonoscope [id number] was  introduced                            through the anus and advanced to the 2 cm into the                            ileum. The colonoscopy was performed without                            difficulty. The patient tolerated the procedure                            well. The quality of the bowel preparation was                            good. The terminal ileum, ileocecal valve,                            appendiceal orifice, and rectum were photographed. Scope In: 8:59:48 AM Scope Out: 9:12:06 AM Scope Withdrawal Time: 0 hours 7 minutes 59 seconds  Total Procedure Duration: 0 hours 12 minutes 18 seconds  Findings:                 Multiple medium-mouthed diverticula were found in                            the sigmoid colon, few in descending colon and                            ascending colon.                           Non-bleeding internal hemorrhoids were found during                            retroflexion. The hemorrhoids were small and Grade                            I (internal hemorrhoids that do not prolapse).  The terminal ileum appeared normal.                           Retroflexion in the right colon was performed.                           The exam was otherwise without abnormality on                            direct and retroflexion views. Complications:            No immediate complications. Estimated Blood Loss:     Estimated blood loss: none. Impression:               - Moderate pancolonic diverticulosis predominantly                            in the sigmoid colon.                           - Non-bleeding internal hemorrhoids.                           - The examined portion of the ileum was normal.                           - The examination was otherwise normal on direct                            and retroflexion views.                           - No specimens collected. Recommendation:           - Patient has a contact number available for                             emergencies. The signs and symptoms of potential                            delayed complications were discussed with the                            patient. Return to normal activities tomorrow.                            Written discharge instructions were provided to the                            patient.                           - High fiber diet.                           - Continue present medications.                           -  Repeat colonoscopy is not recommended for                            screening purposes.                           - Can resume Plavix  and Eliquis from 10/22/2024.                            Please consider changing Plavix  to aspirin  if                            possible.                           - The findings and recommendations were discussed                            with the patient's family. Lynnie Bring, MD 10/21/2024 9:18:58 AM This report has been signed electronically.

## 2024-10-22 ENCOUNTER — Telehealth: Payer: Self-pay

## 2024-10-22 DIAGNOSIS — K449 Diaphragmatic hernia without obstruction or gangrene: Secondary | ICD-10-CM

## 2024-10-22 DIAGNOSIS — D509 Iron deficiency anemia, unspecified: Secondary | ICD-10-CM

## 2024-10-22 NOTE — Progress Notes (Signed)
 Chief Complaint:colon cancer screening, anemia Primary GI Doctor:Dr. Charlanne   HPI:  Patient is a  78  year old female patient with past medical history of With CAD s/p stent 10/18/2021 on ASA/plavix ,, HTN, HLD, CKD4, anemia of chronic disease, anxiety, migraines, insomnia, who was referred to me by Ina Marcellus RAMAN, MD on 05/30/24 for a evaluation of colon cancer screening .   Last seen in GI office by Dr. Charlanne on 11/11/21 for abd pain and diarrhea.   07/10/2024 patient seen by hematology/oncology for anemia/iron  deficiency.She last received IV iron  in October 2024.  She has been receiving monthly Retacrit  injections with a goal to get her hemoglobin above 10.  Reviewed entire note.   12/2023 last seen by cardiology, reviewed entire note.   Interval History    Patient presents to discuss colon screening colonoscopy and evaluation of iron  deficiency anemia.  Patient has had chronic anemia and iron  deficiency dating back to at least 2020, however she tells me as of recent her Hgb dropped below 10 and she has noted dark stools.  Patient receives IV iron  infusions intermittently from the hematology office. Patient notes she has intermittent dark stools, last episode a week ago.Patient has bowel movement daily but the consistency varies. She states her abdomen swells. She is taking OTC Mag oxide, which we discussed can be used for constipation. Patient complains of fatigue and lightheadedness.       Patient take pantoprazole  40 mg twice daily for GERD. She notes mild nausea, no vomiting.  No dysphagia. She notes some weight loss since December 2024 when she lost her husband. She reports she has good support with church.   No alcohol use. Former smoker, stopped in 1989   Patient taking Plavix  75mg  po daily   Surgical history: S/p cholecystectomy, appendicectomy, Total abdominal hysterectomy 1970   Patient's family history includes: mother colon CA age 71     Past GI procedures: Colonoscopy  10/21/2014 ([facility]): Moderate predominantly sigmoid diverticulosis.  Small internal hemorrhoids with previous anorectal surgery.  No active bleeding.   EGD 09/2012: neg. Neg SB Bx for celiac.   CT AP: 10/2021:  No non-contrast CT findings of the abdomen or pelvis to explain left lower quadrant abdominal pain.   CT AP 05/2014: neg        Wt Readings from Last 3 Encounters:  08/15/24 142 lb (64.4 kg)  04/11/24 135 lb 12 oz (61.6 kg)  03/06/24 135 lb 4.8 oz (61.4 kg)        Past Medical History:  Diagnosis Date   Anemia 07/31/2017   Anemia in chronic kidney disease 10/03/2020   Anxiety and depression     CAD S/P percutaneous coronary angioplasty 10/18/2009   Carotid bruit 02/26/2016   Carotid bruit present 02/26/2016   Cavitary pneumonia 03/20/2019    Onset of symptoms around late  Feb 2020 > all symptoms resolved p 5 days Meropenem and rx x 2 weeks then changed to cleocin -  D/c cleocin 03/20/2019  With esr 12, wbc 7,800  -  Quant TB 03/20/2019  Neg  - 05/14/2019 cxr with minimal streaky residual, no as dz    Coronary artery disease involving native coronary artery 02/26/2016   Coronary artery disease involving native coronary artery of native heart with angina pectoris (HCC) 03/12/2016    Overview:  PCI and DES to LAD and RCA in 2003, cath 2010 wo restenosis   Essential hypertension 03/12/2016   GERD (gastroesophageal reflux disease)  High cholesterol 01/17/2017   History of colon polyps     Hx of diverticulitis of colon 09/11/2016   Hyperlipidemia 03/12/2016   Hyperlipidemia with target LDL less than 70 03/12/2016   Hypertension 01/27/2016   IBS (irritable bowel syndrome)     Insomnia 02/26/2016   Iron  deficiency anemia 10/01/2023   Migraine with aura 02/26/2016   Mitral valve prolapse 02/26/2016   Peripheral vascular disease (HCC) 02/26/2016   Persistent atrial fibrillation (HCC) 11/06/2019   Pneumonia of right upper lobe due to infectious organism 02/26/2019   Pre-op  evaluation 11/19/2019   Rheumatoid arthritis involving multiple joints (HCC) 02/26/2016   Rheumatoid arthritis involving multiple sites (HCC) 02/26/2016   Unstable angina (HCC) 10/18/2021               Past Surgical History:  Procedure Laterality Date   ABDOMINAL HYSTERECTOMY       CHOLECYSTECTOMY       COLONOSCOPY   10/21/2014    Moderate predominantly sigmod diverticulosis. Small internal hemorrhoids with evidence of previous anorectal surgery (no active bleeding)   CORONARY STENT INTERVENTION N/A 10/18/2021    Procedure: CORONARY STENT INTERVENTION;  Surgeon: Anner Alm ORN, MD;  Location: [facility] INVASIVE CV LAB;  Service: Cardiovascular;  Laterality: N/A;   ESOPHAGOGASTRODUODENOSCOPY   09/25/2012    Normal EGD   heart stents        x 4   LEFT HEART CATH AND CORONARY ANGIOGRAPHY N/A 10/18/2021    Procedure: LEFT HEART CATH AND CORONARY ANGIOGRAPHY;  Surgeon: Anner Alm ORN, MD;  Location: [facility] INVASIVE CV LAB;  Service: Cardiovascular;  Laterality: N/A;   rectal fissure                    Current Outpatient Medications  Medication Sig Dispense Refill   allopurinol (ZYLOPRIM) 100 MG tablet Take 100 mg by mouth daily.       ALPRAZolam (XANAX) 0.5 MG tablet Take 0.5 mg by mouth as needed.       atorvastatin  (LIPITOR) 40 MG tablet Take 40 mg by mouth daily.       carvedilol  (COREG ) 3.125 MG tablet Take 3.125 mg by mouth 2 (two) times daily.       chlorthalidone (HYGROTON) 25 MG tablet Take 25 mg by mouth daily.       citalopram (CELEXA) 20 MG tablet Take 20 mg by mouth daily.       clopidogrel  (PLAVIX ) 75 MG tablet TAKE 1 TABLET(75 MG) BY MOUTH DAILY 90 tablet 2   isosorbide  mononitrate (IMDUR ) 30 MG 24 hr tablet TAKE 1 TABLET(30 MG) BY MOUTH DAILY 90 tablet 1   Magnesium  Oxide -Mg Supplement 200 MG TABS Take 1 tablet (200 mg total) by mouth daily. 90 tablet 0   Multiple Vitamins-Minerals (MULTIVITAMIN WITH MINERALS) tablet Take 1 tablet by mouth daily.       nitroGLYCERIN   (NITROSTAT ) 0.4 MG SL tablet ONE TABLET UNDER TONGUE AS NEEDED FOR CHEST PAIN EVERY 5 MINUTES 25 tablet 3   pantoprazole  (PROTONIX ) 40 MG tablet Take 40 mg by mouth 2 (two) times daily.       potassium chloride  SA (KLOR-CON ) 20 MEQ tablet Take 20 mEq by mouth 2 (two) times daily.       QUEtiapine (SEROQUEL) 100 MG tablet Take 100 mg by mouth at bedtime.       SSD 1 % cream Apply 1 Application topically 2 (two) times daily.       traMADol (ULTRAM) 50 MG tablet  Take 50 mg by mouth 3 (three) times daily.       valsartan  (DIOVAN ) 40 MG tablet Take 1 tablet (40 mg total) by mouth daily. 90 tablet 3      No current facility-administered medications for this visit.             Allergies as of 08/15/2024 - Review Complete 08/15/2024  Allergen Reaction Noted   Penicillins Anaphylaxis 03/16/2016           Family History  Problem Relation Age of Onset   Heart attack Mother     Alzheimer's disease Father     Heart attack Sister     Breast cancer Sister     Atrial fibrillation Sister     Stroke Sister     Heart attack Brother     Lung cancer Brother          smoked   Stroke Maternal Grandmother     Asthma Neg Hx     Colon cancer Neg Hx     Rectal cancer Neg Hx     Stomach cancer Neg Hx     Esophageal cancer Neg Hx     Pancreatic cancer Neg Hx     Liver cancer Neg Hx            Review of Systems:    Constitutional: No weight loss, fever, chills, weakness or fatigue HEENT: Eyes: No change in vision               Ears, Nose, Throat:  No change in hearing or congestion Skin: No rash or itching Cardiovascular: No chest pain, chest pressure or palpitations   Respiratory: No SOB or cough Gastrointestinal: See HPI and otherwise negative Genitourinary: No dysuria or change in urinary frequency Neurological: No headache, dizziness or syncope Musculoskeletal: No new muscle or joint pain Hematologic: No bleeding or bruising Psychiatric: No history of depression or anxiety       Physical Exam:  Vital signs: BP (!) 104/52   Pulse (!) 55   Ht 5' 3 (1.6 m)   Wt 142 lb (64.4 kg)   SpO2 95%   BMI 25.15 kg/m    Constitutional:   Pleasant female appears to be in NAD, Well developed, Well nourished, alert and cooperative Throat: Oral cavity and pharynx without inflammation, swelling or lesion.  Respiratory: Respirations even and unlabored. Lungs clear to auscultation bilaterally.   No wheezes, crackles, or rhonchi.  Cardiovascular: Normal S1, S2. Regular rate and rhythm. No peripheral edema, cyanosis or pallor.  Gastrointestinal:  Soft, nondistended, nontender. No rebound or guarding. Hypoactive bowel sounds. No appreciable masses or hepatomegaly. Rectal:  Not performed.  Msk:  Symmetrical without gross deformities. Without edema, no deformity or joint abnormality.  Neurologic:  Alert and  oriented x4;  grossly normal neurologically.  Skin:   Dry and intact without significant lesions or rashes.   RELEVANT LABS AND IMAGING: CBC     Latest Ref Rng & Units 08/07/2024    9:41 AM 07/10/2024    9:52 AM 06/06/2024    9:33 AM  CBC  WBC 4.0 - 10.5 K/uL 7.1  5.8  7.0   Hemoglobin 12.0 - 15.0 g/dL 89.5  9.2  9.8   Hematocrit 36.0 - 46.0 % 30.7  28.1  30.8   Platelets 150 - 400 K/uL 167  184  181       CMP         Latest Ref Rng & Units 07/10/2024  9:52 AM 12/21/2023   10:42 AM 12/07/2023    9:44 AM  CMP  Glucose 70 - 99 mg/dL 892  892  886   BUN 8 - 23 mg/dL 18  37  30   Creatinine 0.44 - 1.00 mg/dL 8.29  7.60  8.35   Sodium 135 - 145 mmol/L 134  129  129   Potassium 3.5 - 5.1 mmol/L 4.8  5.0  5.2   Chloride 98 - 111 mmol/L 99  93  94   CO2 22 - 32 mmol/L 24  23  24    Calcium  8.9 - 10.3 mg/dL 9.0  9.8  9.8   Total Protein 6.5 - 8.1 g/dL 7.1  7.2  7.0   Total Bilirubin 0.0 - 1.2 mg/dL 0.3  0.3  0.4   Alkaline Phos 38 - 126 U/L 109  105  100   AST 15 - 41 U/L 22  15  15    ALT 0 - 44 U/L 13  13  5        Recent Labs       Lab Results  Component Value Date     TSH 4.410 04/10/2023    04/2023 echo-  Left ventricular ejection fraction, by estimation, is 60 to 65%.    Assessment:     Encounter Diagnoses  Name Primary?   Other iron  deficiency anemia Yes   Melena     Altered bowel habits     Flatulence     Gastroesophageal reflux disease, unspecified whether esophagitis present     History of colon polyps     Anemia in chronic kidney disease, unspecified CKD stage      78 year old female patient who presents with worsening anemia/IDA and reported melena intermittently, last episode was about a week ago. She is watched closely by hematology and receives IV iron  prn. Most recent hemoglobin 10.4 which is improvement from 9.2 in July.  Her baseline is 8-10.  Patient is currently on PPI therapy twice daily.  Will go ahead and proceed with upper GI endoscopy to rule out gastritis, peptic ulcer disease, and/or H. pylori.   For the inconsistency in stools recommend patient follow high-fiber diet and we can reevaluate after procedures.    Patient also due for colon screening colonoscopy, last colonoscopy was 10 years ago and unremarkable.  Will go ahead and schedule colonoscopy in [facility] with Dr. Charlanne.  Will need cardiac clearance to hold Plavix  from cardiology.   Plan: - Recommend High fiber diet  - Reinforced GERD diet, no late meals 3-4 hours before lying down -Continue Pantoprazole  40 mg twice daily -continue follow-up with hematology -Schedule EGD in [facility] with Dr. Charlanne. The risks and benefits of EGD with possible biopsies and esophageal dilation were discussed with the patient who agrees to proceed. -Schedule for a colonoscopy in [facility] with Dr. Charlanne. The risks and benefits of colonoscopy with possible polypectomy / biopsies were discussed and the patient agrees to proceed.   - cardiac clearance to hold Plavix    Thank you for the courtesy of this consult. Please call me with any questions or concerns.    Deanna May, FNP-C [facility]    Attending physician's note   I have taken history, reviewed the chart and examined the patient. I performed a substantive portion of this encounter, including complete performance of at least one of the key components, in conjunction with the APP. I agree with the Advanced Practitioner's note, impression and recommendations.    Anselm Charlanne, MD  [facility] [phone]

## 2024-10-22 NOTE — Progress Notes (Unsigned)
 Cardiology Office Note:    Date:  10/23/2024   ID:  Angela Mccoy, DOB May 07, 1946, MRN [id number]  PCP:  Angela Marcellus RAMAN, MD  Cardiologist:  Angela Leiter, MD    Referring MD: Angela Marcellus RAMAN, MD    ASSESSMENT:    1. Coronary artery disease involving native coronary artery of native heart with angina pectoris   2. CKD (chronic kidney disease) stage 4, GFR 15-29 ml/min (HCC)   3. Essential hypertension   4. PAF (paroxysmal atrial fibrillation) (HCC)   5. Chronic anticoagulation    PLAN:    In order of problems listed above:  Stable CAD no anginal discomfort I would like her to stay on antiplatelet therapy because of the first generation Taxus stent that can be associated with late in-stent restenosis however I think we could improve her bleeding risk by reducing Eliquis to 2.5 mg daily Stable CKD BP is well-controlled continue her current medical regimen including beta blocker Continue current lipid-lowering treatment atorvastatin  No recurrent atrial fibrillation she has a prescription for diltiazem  if needed and I asked her to purchase mobile Kardia to screen herself daily and capture any symptomatic events Reduce dose Eliquis and continue clopidogrel    Next appointment: 10-month   Medication Adjustments/Labs and Tests Ordered: Current medicines are reviewed at length with the patient today.  Concerns regarding medicines are outlined above.  No orders of the defined types were placed in this encounter.  Meds ordered this encounter  Medications   apixaban (ELIQUIS) 2.5 MG TABS tablet    Sig: Take 1 tablet (2.5 mg total) by mouth 2 (two) times daily.    Dispense:  180 tablet    Refill:  3     History of Present Illness:    Angela Mccoy is a 78 y.o. female with a hx of CAD with remote PCI and drug-eluting stent in the LAD and right coronary artery 2023 and most recent PCI for restenosis October 2022 hypertension hyperlipidemia iron  deficiency  anemia and stage IV CKD  last seen 01/15/2024.  She saw Angela Mccoy nurse practitioner 09/12/2024 after an ED visit with dizziness.  Her EKG showed atrial fibrillation at that time and she spontaneously converted to sinus rhythm prescribed Eliquis referred to cardiology.  Compliance with diet, lifestyle and medications: Yes  She is still grieving from the death of her husband and is feeling better day-to-day No recurrent episode of palpitation and no bleeding on her combined anticoagulant antiplatelet. She has chronic anemia last hemoglobin 8.72 weeks ago For urgently she has elevated creatinine weight is 132 pounds at home and transition to reduced dose anticoagulant along with clopidogrel  she has a first generation Taxus drug-eluting stent. She tolerates her lipid-lowering therapy without muscle pain or weakness Lipid profile January 2025 LDL 50 cholesterol 120 A1c 5.4 recent creatinine 2 weeks ago 1.45 potassium 5.2 Past Medical History:  Diagnosis Date   Anemia 07/31/2017   Anemia in chronic kidney disease 10/03/2020   Anxiety and depression    Atrial fibrillation (HCC) 08/2024   received clearance   CAD S/P percutaneous coronary angioplasty 10/18/2009   Carotid bruit 02/26/2016   Carotid bruit present 02/26/2016   Cavitary pneumonia 03/20/2019   Onset of symptoms around late  Feb 2020 > all symptoms resolved p 5 days Meropenem and rx x 2 weeks then changed to cleocin -  D/c cleocin 03/20/2019  With esr 12, wbc 7,800  -  Quant TB 03/20/2019  Neg  - 05/14/2019 cxr with  minimal streaky residual, no as dz    Coronary artery disease involving native coronary artery of native heart with angina pectoris 03/12/2016   Overview:  PCI and DES to LAD and RCA in 2003, cath 2010 wo restenosis   Essential hypertension 03/12/2016   GERD (gastroesophageal reflux disease)    High cholesterol 01/17/2017   History of colon polyps    Hx of diverticulitis of colon 09/11/2016   Hyperlipidemia 03/12/2016    Hyperlipidemia with target LDL less than 70 03/12/2016   IBS (irritable bowel syndrome)    Insomnia 02/26/2016   Iron  deficiency anemia 10/01/2023   Migraine with aura 02/26/2016   Mitral valve prolapse 02/26/2016   Peripheral vascular disease 02/26/2016   Persistent atrial fibrillation (HCC) 11/06/2019   Pneumonia of right upper lobe due to infectious organism 02/26/2019   Pre-op evaluation 11/19/2019   Rheumatoid arthritis involving multiple joints (HCC) 02/26/2016   Unstable angina (HCC) 10/18/2021    Current Medications: Current Meds  Medication Sig   allopurinol (ZYLOPRIM) 100 MG tablet Take 100 mg by mouth daily.   ALPRAZolam (XANAX) 0.5 MG tablet Take 0.5 mg by mouth as directed. 1 at night, 1/2 in am as needed for anxiety   apixaban (ELIQUIS) 2.5 MG TABS tablet Take 1 tablet (2.5 mg total) by mouth 2 (two) times daily.   atorvastatin  (LIPITOR) 40 MG tablet TAKE 1 TABLET(40 MG) BY MOUTH DAILY   carvedilol  (COREG ) 6.25 MG tablet Take 6.25 mg by mouth 2 (two) times daily with a meal.   chlorthalidone (HYGROTON) 25 MG tablet Take 25 mg by mouth daily.   citalopram (CELEXA) 40 MG tablet Take 40 mg by mouth daily.   clopidogrel  (PLAVIX ) 75 MG tablet TAKE 1 TABLET(75 MG) BY MOUTH DAILY   diltiazem  (CARDIZEM ) 30 MG tablet Take 1 tablet (30 mg total) by mouth as directed. Take one tablet as needed for HR > 110 BPM.   isosorbide  mononitrate (IMDUR ) 30 MG 24 hr tablet TAKE 1 TABLET(30 MG) BY MOUTH DAILY   Magnesium  Oxide -Mg Supplement 200 MG TABS Take 1 tablet (200 mg total) by mouth daily.   nitroGLYCERIN  (NITROSTAT ) 0.4 MG SL tablet ONE TABLET UNDER TONGUE AS NEEDED FOR CHEST PAIN EVERY 5 MINUTES   pantoprazole  (PROTONIX ) 40 MG tablet Take 40 mg by mouth 2 (two) times daily.   potassium chloride  SA (KLOR-CON ) 20 MEQ tablet Take 20 mEq by mouth 2 (two) times daily.   QUEtiapine Fumarate 150 MG TABS Take 1 tablet by mouth at bedtime.   SSD 1 % cream Apply 1 Application topically 2  (two) times daily.   traMADol (ULTRAM) 50 MG tablet Take 50 mg by mouth 3 (three) times daily.   [DISCONTINUED] ELIQUIS 5 MG TABS tablet Take 1 tablet (5 mg total) by mouth 2 (two) times daily.      EKGs/Labs/Other Studies Reviewed:    The following studies were reviewed today:  Cardiac Studies & Procedures   ______________________________________________________________________________________________ CARDIAC CATHETERIZATION  CARDIAC CATHETERIZATION 10/18/2021  Conclusion   Ost RCA to Prox RCA stent is 10% in-stent restenosis.   CULPRIT LESION: Prox RCA to Mid RCA lesion is 85% stenosed just beyond the stent.   After scoring balloon angioplasty, A drug-eluting stent was successfully placed (overlapping previous stent) using a SYNERGY XD 2.75X28.  Postdilated to 3.1 mm   Post intervention, there is a 5% residual stenosis at the most significant segment, however the remainder has 0% visual stenosis.SABRA   ------------------------------   Mid LAD to [facility]  LAD previously placed stent is 10% stenosed (actually focally at small D2).   ------------------------------   The left ventricular systolic function is normal.  The left ventricular ejection fraction is 55-65% by visual estimate.   There is no aortic valve stenosis.  SUMMARY Two-vessel CAD: Widely patent LAD stent with maybe 10 to 15% ISR, otherwise normal LCA system with small nondominant LCx Large dominant RCA with proximal stent that has distal edge leading into a native vessel 85% irregular stenosis.  (CULPRIT LESION) Successful Scoring Balloon Angioplasty followed by DES PCI with an overlapping DES stent (2.75 mm x 28 mm postdilated to 3.1 mm) Normal LVEF and EDP.   RECOMMENDATIONS Stable for Same-Day PCI. Is already on Plavix  -> uninterrupted DAPT now for at least 6 months. Continue aggressive risk factor modification.  Follow-up with Dr. Monetta.    Alm Clay, MD  Findings Coronary Findings Diagnostic  Dominance:  Right  Left Anterior Descending Mid LAD to Dist LAD lesion is 10% stenosed. The lesion is focal. The lesion was previously treated using a stent (unknown type) over 2 years ago. Previously placed stent displays restenosis. The stenosis was measured by a visual reading.  Second Diagonal Branch Vessel is small in size.  Third Diagonal Branch Vessel is small in size.  Left Circumflex Essentially courses almost to the ramus intermedius/high OM  Left Posterior Atrioventricular Artery Vessel is small in size.  Right Coronary Artery Vessel was injected. Vessel is normal in caliber. Ost RCA to Prox RCA lesion is 10% stenosed. The lesion was previously treated using a stent (unknown type) over 2 years ago. Previously placed stent displays restenosis. Prox RCA to Mid RCA lesion is 85% stenosed. The lesion is located proximal to the major branch, segmental and irregular. Tapers from 85% up to about 50%.  Acute Marginal Branch Vessel is small in size.  Right Ventricular Branch Vessel is small in size.  Right Posterior Atrioventricular Artery Vessel is large in size.  First Right Posterolateral Branch Vessel is small in size.  Second Right Posterolateral Branch Vessel is moderate in size.  Intervention  Prox RCA to Mid RCA lesion Stent Lesion length:  24 mm. CATH VISTA GUIDE 6FR JR4 guide catheter was inserted. Lesion crossed with guidewire using a WIRE ASAHI PROWATER 180CM. Pre-stent angioplasty was performed using a BALLN SCOREFLEX 2.50X15. Maximum pressure:  14 atm. Inflation time: 30 sec. BALLN SAPPHIRE 2.0X15 -> 12 ATM, 20 sec A drug-eluting stent was successfully placed using a SYNERGY XD 2.75X28. Maximum pressure: 18 atm. Inflation time: 30 sec. Stent strut is well apposed. At the original 85% lesion site there are 2 focal areas where there is maybe 10% residual stenosis despite aggressive post dilation. Stent overlaps previously placed stent. Post-stent angioplasty was performed  using a BALLN Jardine EMERGE MR 3.0X20. Maximum pressure:  20 atm. Inflation time:  20 sec. Post-Intervention Lesion Assessment The intervention was successful. Pre-interventional TIMI flow is 3. Post-intervention TIMI flow is 3. Treated lesion length:  26 mm. No complications occurred at this lesion. There is a 5% residual stenosis post intervention.   STRESS TESTS  MYOCARDIAL PERFUSION IMAGING 05/26/2021  Interpretation Summary  The left ventricular ejection fraction is hyperdynamic (>65%).  Nuclear stress EF: 67%.  There was no ST segment deviation noted during stress.  No T wave inversion was noted during stress.  This is a low risk study.  The study is normal.   ECHOCARDIOGRAM  ECHOCARDIOGRAM COMPLETE 10/08/2024  Narrative ECHOCARDIOGRAM REPORT    Patient Name:  Angela Mccoy Date of Exam: 10/08/2024 Medical Rec #:  [id number]                 Height:       63.0 in Accession #:    [id number]                Weight:       135.0 lb Date of Birth:  August 21, 1946                BSA:          1.636 m Patient Age:    77 years                  BP:           130/80 mmHg Patient Gender: F                         HR:           66 bpm. Exam Location:  [facility]  Procedure: 2D Echo, Cardiac Doppler, Color Doppler and Strain Analysis (Both Spectral and Color Flow Doppler were utilized during procedure).  Indications:    DOE (dyspnea on exertion) [R06.09 (ICD-10-CM)]  History:        Patient has prior history of Echocardiogram examinations, most recent 05/08/2023. CAD, PVD; Risk Factors:Hypertension and Dyslipidemia. Prior echo 60-65% EF, mild to moderate TR.  Sonographer:    Saddie Chimes Referring Phys: [phone] JENNIFER C WOODY  IMPRESSIONS   1. Left ventricular ejection fraction, by estimation, is 60 to 65%. Left ventricular ejection fraction by 3D volume is 63 %. The left ventricle has normal function. The left ventricle has no regional wall motion abnormalities.  There is mild asymmetric left ventricular hypertrophy of the basal-septal segment. Left ventricular diastolic parameters were normal. The average left ventricular global longitudinal strain is 20.3 %. The global longitudinal strain is normal. 2. Right ventricular systolic function is normal. The right ventricular size is normal. There is normal pulmonary artery systolic pressure. 3. The mitral valve is normal in structure. No evidence of mitral valve regurgitation. No evidence of mitral stenosis. 4. The aortic valve is normal in structure. Aortic valve regurgitation is not visualized. No aortic stenosis is present. 5. The inferior vena cava is normal in size with greater than 50% respiratory variability, suggesting right atrial pressure of 3 mmHg.  FINDINGS Left Ventricle: Left ventricular ejection fraction, by estimation, is 60 to 65%. Left ventricular ejection fraction by 3D volume is 63 %. The left ventricle has normal function. The left ventricle has no regional wall motion abnormalities. The average left ventricular global longitudinal strain is 20.3 %. Strain was performed and the global longitudinal strain is normal. The left ventricular internal cavity size was normal in size. There is mild asymmetric left ventricular hypertrophy of the basal-septal segment. Left ventricular diastolic parameters were normal.  Right Ventricle: The right ventricular size is normal. No increase in right ventricular wall thickness. Right ventricular systolic function is normal. There is normal pulmonary artery systolic pressure. The tricuspid regurgitant velocity is 2.78 m/s, and with an assumed right atrial pressure of 3 mmHg, the estimated right ventricular systolic pressure is 33.9 mmHg.  Left Atrium: Left atrial size was normal in size.  Right Atrium: Right atrial size was normal in size.  Pericardium: There is no evidence of pericardial effusion.  Mitral Valve: The mitral valve is normal in structure.  No evidence of mitral valve regurgitation.  No evidence of mitral valve stenosis. MV peak gradient, 6.2 mmHg. The mean mitral valve gradient is 2.0 mmHg.  Tricuspid Valve: The tricuspid valve is normal in structure. Tricuspid valve regurgitation is mild . No evidence of tricuspid stenosis.  Aortic Valve: The aortic valve is normal in structure. Aortic valve regurgitation is not visualized. No aortic stenosis is present. Aortic valve mean gradient measures 4.5 mmHg. Aortic valve peak gradient measures 7.7 mmHg. Aortic valve area, by VTI measures 1.84 cm.  Pulmonic Valve: The pulmonic valve was normal in structure. Pulmonic valve regurgitation is mild. No evidence of pulmonic stenosis.  Aorta: The aortic root is normal in size and structure.  Venous: The inferior vena cava is normal in size with greater than 50% respiratory variability, suggesting right atrial pressure of 3 mmHg.  IAS/Shunts: No atrial level shunt detected by color flow Doppler.  Additional Comments: 3D was performed not requiring image post processing on an independent workstation and was normal.   LEFT VENTRICLE PLAX 2D LVIDd:         4.30 cm         Diastology LVIDs:         2.70 cm         LV e' medial:    14.60 cm/s LV PW:         1.00 cm         LV E/e' medial:  6.6 LV IVS:        1.30 cm         LV e' lateral:   15.20 cm/s LVOT diam:     1.80 cm         LV E/e' lateral: 6.3 LV SV:         58 LV SV Index:   35              2D Longitudinal LVOT Area:     2.54 cm        Strain 2D Strain GLS   20.3 % Avg:  3D Volume EF LV 3D EF:    Left ventricul ar ejection fraction by 3D volume is 63 %.  3D Volume EF: 3D EF:        63 %  RIGHT VENTRICLE RV Basal diam:  3.20 cm RV Mid diam:    2.30 cm TAPSE (M-mode): 2.2 cm  LEFT ATRIUM           Index LA diam:      3.60 cm 2.20 cm/m LA Vol (A4C): 51.1 ml 31.23 ml/m AORTIC VALVE                    PULMONIC VALVE AV Area (Vmax):    1.88 cm     PV Vmax:        1.17 m/s AV Area (Vmean):   1.76 cm     PV Vmean:      79.300 cm/s AV Area (VTI):     1.84 cm     PV VTI:        0.286 m AV Vmax:           138.50 cm/s  PV Peak grad:  5.5 mmHg AV Vmean:          93.750 cm/s  PV Mean grad:  3.0 mmHg AV VTI:            0.316 m AV Peak Grad:      7.7 mmHg AV Mean Grad:      4.5  mmHg LVOT Vmax:         102.12 cm/s LVOT Vmean:        64.975 cm/s LVOT VTI:          0.228 m LVOT/AV VTI ratio: 0.72  AORTA Ao Asc diam: 2.80 cm  MITRAL VALVE               TRICUSPID VALVE MV Area (PHT): 3.33 cm    TR Peak grad:   30.9 mmHg MV Area VTI:   1.10 cm    TR Vmax:        278.00 cm/s MV Peak grad:  6.2 mmHg MV Mean grad:  2.0 mmHg    SHUNTS MV Vmax:       1.25 m/s    Systemic VTI:  0.23 m MV Vmean:      66.7 cm/s   Systemic Diam: 1.80 cm MV Decel Time: 228 msec MV E velocity: 95.90 cm/s MV A velocity: 90.70 cm/s MV E/A ratio:  1.06  Lamar Fitch MD Electronically signed by Lamar Fitch MD Signature Date/Time: 10/08/2024/4:59:38 PM    Final    MONITORS  LONG TERM MONITOR (3-14 DAYS) 11/24/2019  Narrative ZIO monitor was used for 3 days to assess palpitation in the setting of coronary artery disease.  The study initiated 11/06/2019.  The rhythm throughout was sinus with minimum, average and maximum heart rates of 46, 60 and 99 bpm.  There were no pauses of 3 seconds or greater or episodes of AV nodal or sinus node block.  Ventricular ectopy was rare with isolated PVCs  Supraventricular ectopy was rare with isolated APCs.  There was one 6 beat run of ectopic atrial rhythm noted asymptomatic.  There were no episodes of atrial fibrillation or flutter.  There are 11 triggered events 1 associated with rare atrial premature contraction.  There were 8 diary events of chest pain palpitation shortness of breath and lightheadedness 1 associated with rare atrial premature contraction   Conclusion unremarkable 3-day ZIO monitor triggered and  diary events are generally unassociated with arrhythmia.       ______________________________________________________________________________________________          Recent Labs: 09/12/2024: NT-Pro BNP 1,184; TSH 1.740 10/21/2024: ALT 8; BUN 13; Creatinine, Ser 1.09; Hemoglobin 10.4; Platelets 246.0; Potassium 3.6; Sodium 136  Recent Lipid Panel    Component Value Date/Time   CHOL 120 01/15/2024 1348   TRIG 170 (H) 01/15/2024 1348   HDL 42 01/15/2024 1348   CHOLHDL 2.9 01/15/2024 1348   LDLCALC 50 01/15/2024 1348    Physical Exam:    VS:  BP (!) 142/52   Pulse 70   Ht 5' 3 (1.6 m)   Wt 136 lb 12.8 oz (62.1 kg)   SpO2 96%   BMI 24.23 kg/m     Wt Readings from Last 3 Encounters:  10/23/24 136 lb 12.8 oz (62.1 kg)  10/21/24 142 lb (64.4 kg)  09/12/24 135 lb (61.2 kg)     GEN:  Well nourished, well developed in no acute distress HEENT: Normal NECK: No JVD; No carotid bruits LYMPHATICS: No lymphadenopathy CARDIAC: RRR, no murmurs, rubs, gallops RESPIRATORY:  Clear to auscultation without rales, wheezing or rhonchi  ABDOMEN: Soft, non-tender, non-distended MUSCULOSKELETAL:  No edema; No deformity  SKIN: Warm and dry NEUROLOGIC:  Alert and oriented x 3 PSYCHIATRIC:  Normal affect    Signed, Angela Leiter, MD  10/23/2024 12:35 PM    [facility]

## 2024-10-22 NOTE — Telephone Encounter (Signed)
 Left message

## 2024-10-22 NOTE — Telephone Encounter (Signed)
 Patient returning phone call. Requesting a call back at [phone]. Please advise, thank you

## 2024-10-22 NOTE — Telephone Encounter (Signed)
 LVM on home. Call number [phone] and was told that it was the wrng number for patient so I deleted the number.   CT was requested at procedure visit. Order placed

## 2024-10-23 ENCOUNTER — Encounter: Payer: Self-pay | Admitting: Cardiology

## 2024-10-23 ENCOUNTER — Ambulatory Visit: Attending: Cardiology | Admitting: Cardiology

## 2024-10-23 VITALS — BP 142/52 | HR 70 | Ht 63.0 in | Wt 136.8 lb

## 2024-10-23 DIAGNOSIS — Z7901 Long term (current) use of anticoagulants: Secondary | ICD-10-CM | POA: Insufficient documentation

## 2024-10-23 DIAGNOSIS — I10 Essential (primary) hypertension: Secondary | ICD-10-CM | POA: Insufficient documentation

## 2024-10-23 DIAGNOSIS — N184 Chronic kidney disease, stage 4 (severe): Secondary | ICD-10-CM | POA: Diagnosis not present

## 2024-10-23 DIAGNOSIS — I25119 Atherosclerotic heart disease of native coronary artery with unspecified angina pectoris: Secondary | ICD-10-CM | POA: Diagnosis not present

## 2024-10-23 DIAGNOSIS — I480 Paroxysmal atrial fibrillation: Secondary | ICD-10-CM | POA: Diagnosis not present

## 2024-10-23 MED ORDER — APIXABAN 2.5 MG PO TABS: 2.5000 mg | ORAL_TABLET | Freq: Two times a day (BID) | ORAL | 3 refills | Status: AC

## 2024-10-23 NOTE — Patient Instructions (Signed)
 Medication Instructions:  Your physician has recommended you make the following change in your medication:   START: Eliquis 2.5 mg two times daily (Starting with your next prescription)  *If you need a refill on your cardiac medications before your next appointment, please call your pharmacy*  Lab Work: None If you have labs (blood work) drawn today and your tests are completely normal, you will receive your results only by: MyChart Message (if you have MyChart) OR A paper copy in the mail If you have any lab test that is abnormal or we need to change your treatment, we will call you to review the results.  Testing/Procedures: None  Follow-Up: At [facility], you and your health needs are our priority.  As part of our continuing mission to provide you with exceptional heart care, our providers are all part of one team.  This team includes your primary Cardiologist (physician) and Advanced Practice Providers or APPs (Physician Assistants and Nurse Practitioners) who all work together to provide you with the care you need, when you need it.  Your next appointment:   6 month(s)  Provider:   Redell Leiter, MD    We recommend signing up for the patient portal called MyChart.  Sign up information is provided on this After Visit Summary.  MyChart is used to connect with patients for Virtual Visits (Telemedicine).  Patients are able to view lab/test results, encounter notes, upcoming appointments, etc.  Non-urgent messages can be sent to your provider as well.   To learn more about what you can do with MyChart, go to [email or web address].   Other Instructions Get a mobile kardia and check your heart beat daily

## 2024-10-23 NOTE — Telephone Encounter (Signed)
 Left detailed voicemail

## 2024-10-23 NOTE — Telephone Encounter (Addendum)
 You have been scheduled for a CT scan of the abdomen and pelvis at [facility] (Address [address] [place], [place], [place] [address].   You are scheduled on 11-05-24 at 1pm. You should arrive by 11am your appointment time for registration. Please follow the written instructions below on the day of your exam:  WARNING: IF YOU ARE ALLERGIC TO IODINE/X-RAY DYE, PLEASE NOTIFY RADIOLOGY IMMEDIATELY AT [phone]! YOU WILL BE GIVEN A 13 HOUR PREMEDICATION PREP.  1) Do not eat after 11am   2) You will be given 2 bottles of oral contrast to drink on site.    Drink 1 bottle of contrast @ 11am (2 hours prior to your exam)  Drink 1 bottle of contrast @ 12pm (1 hour prior to your exam)  You may take any medications as prescribed with a small amount of water, if necessary. If you take any of the following medications: METFORMIN, GLUCOPHAGE, GLUCOVANCE, AVANDAMET, RIOMET, FORTAMET, ACTOPLUS MET, JANUMET, GLUMETZA or METAGLIP, you MAY be asked to HOLD this medication 48 hours AFTER the exam.  The purpose of you drinking the oral contrast is to aid in the visualization of your intestinal tract. The contrast solution may cause some diarrhea. Depending on your individual set of symptoms, you may also receive an intravenous injection of x-ray contrast/dye. Plan on being at [facility] for 30 minutes or longer, depending on the type of exam you are having performed.  This test typically takes 30-45 minutes to complete.  If you have any questions regarding your exam or if you need to reschedule, you may call the CT department at [phone] between the hours of 8:00 am and 5:00 pm, Monday-Friday. Contrast is only on Thursday. Without contrast is everyday.

## 2024-10-24 LAB — SURGICAL PATHOLOGY

## 2024-10-24 NOTE — Telephone Encounter (Signed)
 Patient made aware of the information and voiced understanding

## 2024-10-26 ENCOUNTER — Ambulatory Visit: Payer: Self-pay | Admitting: Gastroenterology

## 2024-10-30 ENCOUNTER — Ambulatory Visit: Admitting: Oncology

## 2024-10-30 ENCOUNTER — Ambulatory Visit

## 2024-10-30 ENCOUNTER — Other Ambulatory Visit

## 2024-11-05 ENCOUNTER — Ambulatory Visit (HOSPITAL_BASED_OUTPATIENT_CLINIC_OR_DEPARTMENT_OTHER)
Admission: RE | Admit: 2024-11-05 | Discharge: 2024-11-05 | Disposition: A | Discharge status: Home / Self Care | Source: Ambulatory Visit | Attending: Gastroenterology | Admitting: Gastroenterology

## 2024-11-05 DIAGNOSIS — K5730 Diverticulosis of large intestine without perforation or abscess without bleeding: Secondary | ICD-10-CM | POA: Diagnosis not present

## 2024-11-05 DIAGNOSIS — D509 Iron deficiency anemia, unspecified: Secondary | ICD-10-CM

## 2024-11-05 DIAGNOSIS — K449 Diaphragmatic hernia without obstruction or gangrene: Secondary | ICD-10-CM | POA: Diagnosis not present

## 2024-11-05 MED ORDER — IOHEXOL 300 MG/ML  SOLN
70.0000 mL | Freq: Once | INTRAMUSCULAR | Status: AC | PRN
  Administered 2024-11-05: 70 mL via INTRAVENOUS

## 2024-11-05 NOTE — Progress Notes (Signed)
 [facility] at [facility] [address] [place],  [place]  [address] [phone]  Clinic Day:  11/06/2024  Referring physician: Ina Marcellus RAMAN, MD   HISTORY OF PRESENT ILLNESS:  The patient is a 78 y.o. female with anemia secondary to both chronic renal insufficiency and previous iron  deficiency.   She last received IV iron  in October 2024.  She has been receiving monthly Retacrit  injections with the goal to get her hemoglobin above 10.  She comes in today to reassess her hemoglobin and iron  levels.  Since her last visit, the patient has been doing well.  She denies having increased fatigue or any overt forms of blood loss which concern her for progressive anemia.    PHYSICAL EXAM:  Blood pressure (!) 150/48, pulse 72, temperature 99 F (37.2 C), temperature source Oral, resp. rate 14, height 5' 3 (1.6 m), weight 138 lb 9.6 oz (62.9 kg), SpO2 94%. Wt Readings from Last 3 Encounters:  11/06/24 138 lb 9.6 oz (62.9 kg)  10/23/24 136 lb 12.8 oz (62.1 kg)  10/21/24 142 lb (64.4 kg)   Body mass index is 24.55 kg/m. Performance status (ECOG): 1 - Symptomatic but completely ambulatory Physical Exam Constitutional:      Appearance: Normal appearance. She is not ill-appearing.  HENT:     Mouth/Throat:     Mouth: Mucous membranes are moist.     Pharynx: Oropharynx is clear. No oropharyngeal exudate or posterior oropharyngeal erythema.  Cardiovascular:     Rate and Rhythm: Normal rate and regular rhythm.     Heart sounds: No murmur heard.    No friction rub. No gallop.  Pulmonary:     Effort: Pulmonary effort is normal. No respiratory distress.     Breath sounds: Normal breath sounds. No wheezing, rhonchi or rales.  Abdominal:     General: Bowel sounds are normal. There is no distension.     Palpations: Abdomen is soft. There is no mass.     Tenderness: There is no abdominal tenderness.  Musculoskeletal:        General: No swelling.     Right lower leg: No edema.      Left lower leg: No edema.  Lymphadenopathy:     Cervical: No cervical adenopathy.     Upper Body:     Right upper body: No supraclavicular or axillary adenopathy.     Left upper body: No supraclavicular or axillary adenopathy.     Lower Body: No right inguinal adenopathy. No left inguinal adenopathy.  Skin:    General: Skin is warm.     Coloration: Skin is not jaundiced.     Findings: No lesion or rash.  Neurological:     General: No focal deficit present.     Mental Status: She is alert and oriented to person, place, and time. Mental status is at baseline.  Psychiatric:        Mood and Affect: Mood normal.        Behavior: Behavior normal.        Thought Content: Thought content normal.     LABS:      Latest Ref Rng & Units 11/06/2024    9:33 AM 10/21/2024    9:49 AM 10/09/2024    9:41 AM  CBC  WBC 4.0 - 10.5 K/uL 6.3  6.3  5.9   Hemoglobin 12.0 - 15.0 g/dL 9.5  89.5  8.7   Hematocrit 36.0 - 46.0 % 29.4  31.0  26.6  Platelets 150 - 400 K/uL 211  246.0  169       Latest Ref Rng & Units 11/06/2024    9:33 AM 10/21/2024    9:49 AM 10/07/2024    8:39 AM  CMP  Glucose 70 - 99 mg/dL 876  878  896   BUN 8 - 23 mg/dL 33  13  26   Creatinine 0.44 - 1.00 mg/dL 8.17  8.90  8.54   Sodium 135 - 145 mmol/L 135  136  133   Potassium 3.5 - 5.1 mmol/L 5.2  3.6  5.2   Chloride 98 - 111 mmol/L 98  95  96   CO2 22 - 32 mmol/L 25  28  24    Calcium  8.9 - 10.3 mg/dL 89.6  9.4  9.3   Total Protein 6.5 - 8.1 g/dL 7.7  7.8    Total Bilirubin 0.0 - 1.2 mg/dL 0.2  0.6    Alkaline Phos 38 - 126 U/L 113  83    AST 15 - 41 U/L 19  13    ALT 0 - 44 U/L 7  8      Latest Reference Range & Units 11/06/24 09:33  Iron  28 - 170 ug/dL 53  UIBC ug/dL 749  TIBC 749 - 549 ug/dL 697  Saturation Ratios 10.4 - 31.8 % 17  Ferritin 11 - 307 ng/mL 501 (H)  (H): Data is abnormally high  ASSESSMENT & PLAN:  Assessment/Plan:  A 78 y.o. female with anemia secondary to chronic renal insufficiency,  as well as iron  deficiency.  When evaluating her labs today, her hemoglobin is below 10.  However, her iron  parameters are normal.  Based upon this, she will get back to receiving Retacrit  injections on a monthly basis to get her hemoglobin to/above 10.  Her CBC will be checked monthly, with Retacrit  being given if it remains below 10.  I will see her back in 4 months for repeat clinical assessment.  The patient understands all the plans discussed today and is in agreement with them.    Dravyn Severs DELENA Kerns, MD

## 2024-11-06 ENCOUNTER — Inpatient Hospital Stay: Attending: Oncology

## 2024-11-06 ENCOUNTER — Inpatient Hospital Stay (HOSPITAL_BASED_OUTPATIENT_CLINIC_OR_DEPARTMENT_OTHER): Admitting: Oncology

## 2024-11-06 ENCOUNTER — Telehealth: Payer: Self-pay | Admitting: Oncology

## 2024-11-06 ENCOUNTER — Inpatient Hospital Stay

## 2024-11-06 ENCOUNTER — Other Ambulatory Visit: Payer: Self-pay | Admitting: Oncology

## 2024-11-06 VITALS — BP 150/48 | HR 72 | Temp 99.0°F | Resp 14 | Ht 63.0 in | Wt 138.6 lb

## 2024-11-06 DIAGNOSIS — D631 Anemia in chronic kidney disease: Secondary | ICD-10-CM

## 2024-11-06 DIAGNOSIS — N189 Chronic kidney disease, unspecified: Secondary | ICD-10-CM | POA: Diagnosis not present

## 2024-11-06 DIAGNOSIS — N183 Chronic kidney disease, stage 3 unspecified: Secondary | ICD-10-CM

## 2024-11-06 DIAGNOSIS — N184 Chronic kidney disease, stage 4 (severe): Secondary | ICD-10-CM | POA: Diagnosis not present

## 2024-11-06 LAB — CMP (CANCER CENTER ONLY)
ALT: 7 U/L (ref 0–44)
AST: 19 U/L (ref 15–41)
Albumin: 4.3 g/dL (ref 3.5–5.0)
Alkaline Phosphatase: 113 U/L (ref 38–126)
Anion gap: 12 (ref 5–15)
BUN: 33 mg/dL — ABNORMAL HIGH (ref 8–23)
CO2: 25 mmol/L (ref 22–32)
Calcium: 10.3 mg/dL (ref 8.9–10.3)
Chloride: 98 mmol/L (ref 98–111)
Creatinine: 1.82 mg/dL — ABNORMAL HIGH (ref 0.44–1.00)
GFR, Estimated: 28 mL/min — ABNORMAL LOW (ref >60)
Glucose, Bld: 123 mg/dL — ABNORMAL HIGH (ref 70–99)
Potassium: 5.2 mmol/L — ABNORMAL HIGH (ref 3.5–5.1)
Sodium: 135 mmol/L (ref 135–145)
Total Bilirubin: 0.2 mg/dL (ref 0.0–1.2)
Total Protein: 7.7 g/dL (ref 6.5–8.1)

## 2024-11-06 LAB — CBC WITH DIFFERENTIAL (CANCER CENTER ONLY)
Abs Immature Granulocytes: 0.02 K/uL (ref 0.00–0.07)
Basophils Absolute: 0 K/uL (ref 0.0–0.1)
Basophils Relative: 1 %
Eosinophils Absolute: 0.2 K/uL (ref 0.0–0.5)
Eosinophils Relative: 3 %
HCT: 29.4 % — ABNORMAL LOW (ref 36.0–46.0)
Hemoglobin: 9.5 g/dL — ABNORMAL LOW (ref 12.0–15.0)
Immature Granulocytes: 0 %
Lymphocytes Relative: 12 %
Lymphs Abs: 0.8 K/uL (ref 0.7–4.0)
MCH: 30.2 pg (ref 26.0–34.0)
MCHC: 32.3 g/dL (ref 30.0–36.0)
MCV: 93.3 fL (ref 80.0–100.0)
Monocytes Absolute: 0.3 K/uL (ref 0.1–1.0)
Monocytes Relative: 4 %
Neutro Abs: 5.1 K/uL (ref 1.7–7.7)
Neutrophils Relative %: 80 %
Platelet Count: 211 K/uL (ref 150–400)
RBC: 3.15 MIL/uL — ABNORMAL LOW (ref 3.87–5.11)
RDW: 13.8 % (ref 11.5–15.5)
WBC Count: 6.3 K/uL (ref 4.0–10.5)
nRBC: 0 % (ref 0.0–0.2)

## 2024-11-06 LAB — IRON AND TIBC
Iron: 53 ug/dL (ref 28–170)
Saturation Ratios: 17 % (ref 10.4–31.8)
TIBC: 302 ug/dL (ref 250–450)
UIBC: 250 ug/dL

## 2024-11-06 LAB — FERRITIN: Ferritin: 501 ng/mL — ABNORMAL HIGH (ref 11–307)

## 2024-11-06 MED ORDER — EPOETIN ALFA-EPBX 40000 UNIT/ML IJ SOLN
40000.0000 [IU] | Freq: Once | INTRAMUSCULAR | Status: AC
  Administered 2024-11-06: 40000 [IU] via SUBCUTANEOUS
  Filled 2024-11-06: qty 1

## 2024-11-06 NOTE — Patient Instructions (Signed)

## 2024-11-06 NOTE — Telephone Encounter (Signed)
 Patient has been scheduled for follow-up visit per 11/06/24 LOS.  LVM notifying pt of appt details, provided my direct number to pt if appt changes need to be made.

## 2024-11-08 ENCOUNTER — Ambulatory Visit: Payer: Self-pay | Admitting: Gastroenterology

## 2024-11-09 DIAGNOSIS — R55 Syncope and collapse: Secondary | ICD-10-CM

## 2024-11-09 DIAGNOSIS — I480 Paroxysmal atrial fibrillation: Secondary | ICD-10-CM | POA: Diagnosis not present

## 2024-11-11 NOTE — Telephone Encounter (Signed)
-----   Message from Delon JAYSON Hoover sent at 11/10/2024  7:55 AM EST ----- Monitor revealed no episode of a slow heart rate, there were a few events where her heart rate sped up but nothing that would cause her to pass out.  She did have atrial fibrillation at approximately  4% of the time.  Continue current medications.  Continue to stay well-hydrated with mostly water.  She is already anticoagulated on Eliquis which we will continue. ----- Message ----- From: Monetta Redell PARAS, MD Sent: 11/09/2024   9:30 AM EST To: Delon JAYSON Hoover, NP

## 2024-11-11 NOTE — Telephone Encounter (Signed)
 Left vm to return call.

## 2024-11-13 ENCOUNTER — Inpatient Hospital Stay (HOSPITAL_BASED_OUTPATIENT_CLINIC_OR_DEPARTMENT_OTHER): Admission: RE | Admit: 2024-11-13 | Source: Ambulatory Visit | Admitting: Radiology

## 2024-11-20 ENCOUNTER — Other Ambulatory Visit: Payer: Self-pay | Admitting: Cardiology

## 2024-11-24 ENCOUNTER — Other Ambulatory Visit: Payer: Self-pay

## 2024-11-24 MED ORDER — ISOSORBIDE MONONITRATE ER 30 MG PO TB24: 30.0000 mg | ORAL_TABLET | Freq: Every day | ORAL | 2 refills | Status: AC

## 2024-11-26 DIAGNOSIS — G4733 Obstructive sleep apnea (adult) (pediatric): Secondary | ICD-10-CM | POA: Diagnosis not present

## 2024-11-26 DIAGNOSIS — Z87891 Personal history of nicotine dependence: Secondary | ICD-10-CM | POA: Diagnosis not present

## 2024-12-04 ENCOUNTER — Inpatient Hospital Stay

## 2024-12-04 ENCOUNTER — Inpatient Hospital Stay: Attending: Oncology

## 2024-12-05 ENCOUNTER — Inpatient Hospital Stay: Attending: Oncology

## 2024-12-05 ENCOUNTER — Inpatient Hospital Stay

## 2024-12-05 DIAGNOSIS — N183 Chronic kidney disease, stage 3 unspecified: Secondary | ICD-10-CM

## 2024-12-05 DIAGNOSIS — D509 Iron deficiency anemia, unspecified: Secondary | ICD-10-CM | POA: Diagnosis present

## 2024-12-05 LAB — CBC WITH DIFFERENTIAL (CANCER CENTER ONLY)
Abs Immature Granulocytes: 0.07 K/uL (ref 0.00–0.07)
Basophils Absolute: 0.1 K/uL (ref 0.0–0.1)
Basophils Relative: 1 %
Eosinophils Absolute: 0.3 K/uL (ref 0.0–0.5)
Eosinophils Relative: 5 %
HCT: 32 % — ABNORMAL LOW (ref 36.0–46.0)
Hemoglobin: 10.1 g/dL — ABNORMAL LOW (ref 12.0–15.0)
Immature Granulocytes: 1 %
Lymphocytes Relative: 21 %
Lymphs Abs: 1.2 K/uL (ref 0.7–4.0)
MCH: 30 pg (ref 26.0–34.0)
MCHC: 31.6 g/dL (ref 30.0–36.0)
MCV: 95 fL (ref 80.0–100.0)
Monocytes Absolute: 0.4 K/uL (ref 0.1–1.0)
Monocytes Relative: 7 %
Neutro Abs: 3.9 K/uL (ref 1.7–7.7)
Neutrophils Relative %: 65 %
Platelet Count: 170 K/uL (ref 150–400)
RBC: 3.37 MIL/uL — ABNORMAL LOW (ref 3.87–5.11)
RDW: 14.7 % (ref 11.5–15.5)
WBC Count: 6 K/uL (ref 4.0–10.5)
nRBC: 0 % (ref 0.0–0.2)

## 2024-12-05 NOTE — Progress Notes (Signed)
 HGB 10.1 no shot

## 2024-12-24 ENCOUNTER — Other Ambulatory Visit: Payer: Self-pay

## 2024-12-24 MED ORDER — DILTIAZEM HCL 30 MG PO TABS: 30.0000 mg | ORAL_TABLET | ORAL | 2 refills | Status: AC

## 2025-01-01 ENCOUNTER — Inpatient Hospital Stay

## 2025-01-01 ENCOUNTER — Other Ambulatory Visit: Payer: Self-pay

## 2025-01-01 ENCOUNTER — Inpatient Hospital Stay: Attending: Oncology

## 2025-01-01 VITALS — BP 125/70 | HR 65 | Resp 18 | Ht 63.0 in

## 2025-01-01 DIAGNOSIS — D631 Anemia in chronic kidney disease: Secondary | ICD-10-CM | POA: Diagnosis present

## 2025-01-01 DIAGNOSIS — N184 Chronic kidney disease, stage 4 (severe): Secondary | ICD-10-CM | POA: Diagnosis present

## 2025-01-01 DIAGNOSIS — N183 Chronic kidney disease, stage 3 unspecified: Secondary | ICD-10-CM

## 2025-01-01 LAB — CBC WITH DIFFERENTIAL (CANCER CENTER ONLY)
Abs Immature Granulocytes: 0.02 K/uL (ref 0.00–0.07)
Basophils Absolute: 0.1 K/uL (ref 0.0–0.1)
Basophils Relative: 1 %
Eosinophils Absolute: 0.2 K/uL (ref 0.0–0.5)
Eosinophils Relative: 3 %
HCT: 26 % — ABNORMAL LOW (ref 36.0–46.0)
Hemoglobin: 8.7 g/dL — ABNORMAL LOW (ref 12.0–15.0)
Immature Granulocytes: 0 %
Lymphocytes Relative: 22 %
Lymphs Abs: 1.2 K/uL (ref 0.7–4.0)
MCH: 30.7 pg (ref 26.0–34.0)
MCHC: 33.5 g/dL (ref 30.0–36.0)
MCV: 91.9 fL (ref 80.0–100.0)
Monocytes Absolute: 0.5 K/uL (ref 0.1–1.0)
Monocytes Relative: 9 %
Neutro Abs: 3.6 K/uL (ref 1.7–7.7)
Neutrophils Relative %: 65 %
Platelet Count: 142 K/uL — ABNORMAL LOW (ref 150–400)
RBC: 2.83 MIL/uL — ABNORMAL LOW (ref 3.87–5.11)
RDW: 14.7 % (ref 11.5–15.5)
WBC Count: 5.5 K/uL (ref 4.0–10.5)
nRBC: 0 % (ref 0.0–0.2)

## 2025-01-01 MED ORDER — EPOETIN ALFA-EPBX 40000 UNIT/ML IJ SOLN
40000.0000 [IU] | Freq: Once | INTRAMUSCULAR | Status: AC
  Administered 2025-01-01: 40000 [IU] via SUBCUTANEOUS
  Filled 2025-01-01: qty 1

## 2025-01-01 NOTE — Progress Notes (Signed)
 1015 TALKED TO DR. EZZARD IN REFERENCE TO PATIENT HEMOGLOBIN OF 8.7 TODAY. WE WILL PROCEED WITH RETACRIT  TODAY. THE PATIENT WILL CALL TRIAGE IF SHE NEEDS TO HAVE ANOTHER LAB DRAW ON NEXT WEEK. THE PATIENT'S RETACRIT  WAS HELD LAST TIME SO THAT IS WHY SHE IS SO LOW. THE PATIENT  VERBALIZED UNDERSTANDING AND AGREED WITH THE ABOVE.

## 2025-01-02 LAB — SOLUBLE TRANSFERRIN RECEPTOR: Transferrin Receptor: 16.5 nmol/L (ref 12.2–27.3)

## 2025-01-13 ENCOUNTER — Other Ambulatory Visit: Payer: Self-pay | Admitting: Cardiology

## 2025-01-21 ENCOUNTER — Other Ambulatory Visit: Payer: Self-pay | Admitting: Cardiology

## 2025-01-29 ENCOUNTER — Inpatient Hospital Stay

## 2025-02-04 ENCOUNTER — Inpatient Hospital Stay: Attending: Oncology

## 2025-02-04 ENCOUNTER — Inpatient Hospital Stay

## 2025-02-26 ENCOUNTER — Inpatient Hospital Stay

## 2025-02-26 ENCOUNTER — Inpatient Hospital Stay: Admitting: Oncology
